# Patient Record
Sex: Male | Born: 1955 | Race: White | Hispanic: No | Smoking: Current every day smoker
Health system: Southern US, Community
[De-identification: ages and names within clinical notes are randomized; demographics above are authoritative.]

## PROBLEM LIST (undated history)

## (undated) DIAGNOSIS — I219 Acute myocardial infarction, unspecified: Secondary | ICD-10-CM

## (undated) DIAGNOSIS — I4891 Unspecified atrial fibrillation: Secondary | ICD-10-CM

## (undated) DIAGNOSIS — N289 Disorder of kidney and ureter, unspecified: Secondary | ICD-10-CM

## (undated) DIAGNOSIS — I10 Essential (primary) hypertension: Secondary | ICD-10-CM

## (undated) DIAGNOSIS — J449 Chronic obstructive pulmonary disease, unspecified: Secondary | ICD-10-CM

## (undated) HISTORY — PX: KIDNEY TRANSPLANT: SHX239

## (undated) HISTORY — PX: NEPHRECTOMY TRANSPLANTED ORGAN: SUR880

---

## 2007-12-12 ENCOUNTER — Ambulatory Visit (HOSPITAL_COMMUNITY): Admission: RE | Admit: 2007-12-12 | Discharge: 2007-12-12 | Payer: Self-pay | Admitting: Nephrology

## 2009-04-25 HISTORY — PX: KIDNEY TRANSPLANT: SHX239

## 2010-01-26 ENCOUNTER — Emergency Department (HOSPITAL_COMMUNITY): Admission: EM | Admit: 2010-01-26 | Discharge: 2010-01-26 | Payer: Self-pay | Admitting: Emergency Medicine

## 2014-03-08 ENCOUNTER — Emergency Department (HOSPITAL_COMMUNITY): Payer: Medicare Other

## 2014-03-08 ENCOUNTER — Emergency Department (HOSPITAL_COMMUNITY)
Admission: EM | Admit: 2014-03-08 | Discharge: 2014-03-08 | Disposition: A | Discharge status: Home / Self Care | Payer: Medicare Other | Attending: Emergency Medicine | Admitting: Emergency Medicine

## 2014-03-08 ENCOUNTER — Encounter (HOSPITAL_COMMUNITY): Payer: Self-pay | Admitting: Cardiology

## 2014-03-08 DIAGNOSIS — S20212A Contusion of left front wall of thorax, initial encounter: Secondary | ICD-10-CM | POA: Insufficient documentation

## 2014-03-08 DIAGNOSIS — S7002XA Contusion of left hip, initial encounter: Secondary | ICD-10-CM | POA: Insufficient documentation

## 2014-03-08 DIAGNOSIS — Z87448 Personal history of other diseases of urinary system: Secondary | ICD-10-CM | POA: Diagnosis not present

## 2014-03-08 DIAGNOSIS — S0083XA Contusion of other part of head, initial encounter: Secondary | ICD-10-CM | POA: Diagnosis not present

## 2014-03-08 DIAGNOSIS — W228XXA Striking against or struck by other objects, initial encounter: Secondary | ICD-10-CM | POA: Diagnosis not present

## 2014-03-08 DIAGNOSIS — Y9389 Activity, other specified: Secondary | ICD-10-CM | POA: Insufficient documentation

## 2014-03-08 DIAGNOSIS — Z940 Kidney transplant status: Secondary | ICD-10-CM | POA: Insufficient documentation

## 2014-03-08 DIAGNOSIS — Y998 Other external cause status: Secondary | ICD-10-CM | POA: Diagnosis not present

## 2014-03-08 DIAGNOSIS — W19XXXA Unspecified fall, initial encounter: Secondary | ICD-10-CM

## 2014-03-08 DIAGNOSIS — Y92481 Parking lot as the place of occurrence of the external cause: Secondary | ICD-10-CM | POA: Insufficient documentation

## 2014-03-08 DIAGNOSIS — I10 Essential (primary) hypertension: Secondary | ICD-10-CM | POA: Diagnosis not present

## 2014-03-08 DIAGNOSIS — Z720 Tobacco use: Secondary | ICD-10-CM | POA: Insufficient documentation

## 2014-03-08 DIAGNOSIS — S40012A Contusion of left shoulder, initial encounter: Secondary | ICD-10-CM | POA: Insufficient documentation

## 2014-03-08 DIAGNOSIS — J441 Chronic obstructive pulmonary disease with (acute) exacerbation: Secondary | ICD-10-CM | POA: Diagnosis not present

## 2014-03-08 DIAGNOSIS — W1789XA Other fall from one level to another, initial encounter: Secondary | ICD-10-CM | POA: Diagnosis not present

## 2014-03-08 DIAGNOSIS — S79912A Unspecified injury of left hip, initial encounter: Secondary | ICD-10-CM | POA: Diagnosis present

## 2014-03-08 DIAGNOSIS — E119 Type 2 diabetes mellitus without complications: Secondary | ICD-10-CM | POA: Insufficient documentation

## 2014-03-08 HISTORY — DX: Chronic obstructive pulmonary disease, unspecified: J44.9

## 2014-03-08 HISTORY — DX: Essential (primary) hypertension: I10

## 2014-03-08 HISTORY — DX: Disorder of kidney and ureter, unspecified: N28.9

## 2014-03-08 LAB — CBC
HEMATOCRIT: 45.5 % (ref 39.0–52.0)
HEMOGLOBIN: 15.4 g/dL (ref 13.0–17.0)
MCH: 31.8 pg (ref 26.0–34.0)
MCHC: 33.8 g/dL (ref 30.0–36.0)
MCV: 93.8 fL (ref 78.0–100.0)
Platelets: 150 10*3/uL (ref 150–400)
RBC: 4.85 MIL/uL (ref 4.22–5.81)
RDW: 13.4 % (ref 11.5–15.5)
WBC: 6.6 10*3/uL (ref 4.0–10.5)

## 2014-03-08 LAB — BASIC METABOLIC PANEL
Anion gap: 13 (ref 5–15)
BUN: 12 mg/dL (ref 6–23)
CHLORIDE: 99 meq/L (ref 96–112)
CO2: 23 meq/L (ref 19–32)
Calcium: 9.5 mg/dL (ref 8.4–10.5)
Creatinine, Ser: 1.03 mg/dL (ref 0.50–1.35)
GFR calc Af Amer: >90 mL/min (ref >90)
GFR, EST NON AFRICAN AMERICAN: 79 mL/min — AB (ref >90)
GLUCOSE: 106 mg/dL — AB (ref 70–99)
POTASSIUM: 4 meq/L (ref 3.7–5.3)
SODIUM: 135 meq/L — AB (ref 137–147)

## 2014-03-08 MED ORDER — ONDANSETRON HCL 4 MG/2ML IJ SOLN
4.0000 mg | Freq: Once | INTRAMUSCULAR | Status: AC
  Administered 2014-03-08: 4 mg via INTRAVENOUS
  Filled 2014-03-08: qty 2

## 2014-03-08 MED ORDER — METHOCARBAMOL 500 MG PO TABS: 1000.0000 mg | ORAL_TABLET | Freq: Four times a day (QID) | ORAL | Status: DC

## 2014-03-08 MED ORDER — HYDROMORPHONE HCL 1 MG/ML IJ SOLN
1.0000 mg | Freq: Once | INTRAMUSCULAR | Status: AC
  Administered 2014-03-08: 1 mg via INTRAVENOUS
  Filled 2014-03-08: qty 1

## 2014-03-08 MED ORDER — OXYCODONE-ACETAMINOPHEN 5-325 MG PO TABS: 1.0000 | ORAL_TABLET | Freq: Four times a day (QID) | ORAL | Status: DC | PRN

## 2014-03-08 NOTE — Progress Notes (Signed)
RT late entry: Instructed patient with IS use. Good effort times 4, 500-700.

## 2014-03-08 NOTE — ED Notes (Signed)
Patient transported to X-ray 

## 2014-03-08 NOTE — ED Provider Notes (Signed)
CSN: [id number]     Arrival date & time 03/08/14  1037 History   First MD Initiated Contact with Patient 03/08/14 1053     Chief Complaint  Patient presents with  . Fall  . Hip Pain     (Consider location/radiation/quality/duration/timing/severity/associated sxs/prior Treatment) HPI Comments: Patient with history of diabetes, renal transplant, hypertension -- presents after a fall which occurred 1-1/2 days ago. Patient was on the top of a construction van approximately 8 feet in the air. Patient fell onto the asphalt onto his left back. A construction ladder then fell striking him on the right side of the head. No LOC. EMS was called but patient did not want to go to the hospital. Patient states that yesterday he felt a popping pain in his back. He has been short of breath. He complains of left shoulder and left hip pain as well as pain in his left lateral back. No blurry vision, vomiting, headache. He takes I hydrocodone at home which helps take the edge off of the pain. His pain was severe today so he came to the emergency department. He denies numbness, tingling in his extremities.  Patient is a 58 y.o. male presenting with fall and hip pain. The history is provided by the patient and the spouse.  Fall Associated symptoms include arthralgias. Pertinent negatives include no abdominal pain, chest pain, coughing, fatigue, fever, headaches, myalgias, nausea, neck pain, numbness, rash, sore throat, vomiting or weakness.  Hip Pain Associated symptoms include arthralgias. Pertinent negatives include no abdominal pain, chest pain, coughing, fatigue, fever, headaches, myalgias, nausea, neck pain, numbness, rash, sore throat, vomiting or weakness.    Past Medical History  Diagnosis Date  . Hypertension   . Renal disorder   . COPD (chronic obstructive pulmonary disease)    Past Surgical History  Procedure Laterality Date  . [facility]   History reviewed. No pertinent  family history. History  Substance Use Topics  . Smoking status: Current Every Day Smoker    Types: Cigarettes  . Smokeless tobacco: Not on file  . Alcohol Use: Yes    Review of Systems  Constitutional: Negative for fever and fatigue.  HENT: Negative for rhinorrhea, sore throat and tinnitus.   Eyes: Negative for photophobia, pain, redness and visual disturbance.  Respiratory: Negative for cough and shortness of breath.   Cardiovascular: Negative for chest pain.  Gastrointestinal: Negative for nausea, vomiting, abdominal pain and diarrhea.  Genitourinary: Negative for dysuria.  Musculoskeletal: Positive for back pain and arthralgias. Negative for myalgias, gait problem and neck pain.  Skin: Positive for color change. Negative for rash and wound.  Neurological: Negative for dizziness, weakness, light-headedness, numbness and headaches.  Psychiatric/Behavioral: Negative for confusion and decreased concentration.    Allergies  Review of patient's allergies indicates no known allergies.  Home Medications   Prior to Admission medications   Not on File   BP 168/110 mmHg  Pulse 73  Temp(Src) 97.9 F (36.6 C) (Oral)  Resp 18  Ht 5\' 8"  (1.727 m)  Wt 120 lb (54.432 kg)  BMI 18.25 kg/m2  SpO2 94%   Physical Exam  Constitutional: He is oriented to person, place, and time. He appears well-developed and well-nourished.  HENT:  Head: Normocephalic. Head is without raccoon's eyes and without Battle's sign.  Right Ear: Tympanic membrane, external ear and ear canal normal. No hemotympanum.  Left Ear: Tympanic membrane, external ear and ear canal normal. No hemotympanum.  Nose: Nose normal. No  nasal septal hematoma.  Mouth/Throat: Oropharynx is clear and moist.  Mild ecchymosis R lateral forehead  Eyes: Conjunctivae, EOM and lids are normal. Right eye exhibits no discharge. Left eye exhibits no discharge. Right pupil is not reactive (chronic). Right pupil is round. Left pupil is round  and reactive. Pupils are unequal (chronic).  No visible hyphema. 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   Neck: Normal range of motion. Neck supple.  Cardiovascular: Normal rate, regular rhythm and normal heart sounds.   No murmur heard. Pulmonary/Chest: Effort normal and breath sounds normal. He exhibits no tenderness.  Abdominal: Soft. There is no tenderness.  Musculoskeletal: Normal range of motion.       Right shoulder: He exhibits normal range of motion, no tenderness and no bony tenderness.       Left shoulder: He exhibits tenderness and bony tenderness. He exhibits normal range of motion.       Right elbow: Normal.      Left elbow: Normal.       Right wrist: Normal.       Left wrist: Normal.       Right hip: Normal.       Left hip: He exhibits tenderness and bony tenderness. He exhibits normal range of motion and normal strength.       Right knee: Normal.       Left knee: Normal.       Right ankle: Normal.       Left ankle: Normal.       Cervical back: He exhibits normal range of motion, no tenderness and no bony tenderness.       Thoracic back: He exhibits no tenderness and no bony tenderness.       Lumbar back: He exhibits no tenderness and no bony tenderness.       Back:       Right hand: Normal.       Left hand: Normal.  Neurological: He is alert and oriented to person, place, and time. He has normal strength and normal reflexes. No cranial nerve deficit or sensory deficit. Coordination normal. GCS eye subscore is 4. GCS verbal subscore is 5. GCS motor subscore is 6.  Skin: Skin is warm and dry.  Psychiatric: He has a normal mood and affect.  Nursing note and vitals reviewed.   ED Course  Procedures (including critical care time) Labs Review Labs Reviewed  BASIC METABOLIC PANEL - Abnormal; Notable for the following:    Sodium 135 (*)    Glucose, Bld 106 (*)    GFR calc non Af Amer 79 (*)    All other components within normal limits  CBC    URINALYSIS, ROUTINE W REFLEX MICROSCOPIC    Imaging Review Dg Ribs Unilateral W/chest Left  03/08/2014   CLINICAL DATA:  Fall 2 days previous with persistent left posterior chest pain  EXAM: LEFT RIBS AND CHEST - 3+ VIEW  COMPARISON:  None.  FINDINGS: No fracture or other bone lesions are seen involving the ribs. There is no evidence of pneumothorax or pleural effusion. Both lungs are clear. Heart size and mediastinal contours are within normal limits. Scattered calcified granulomas are noted.  IMPRESSION: No acute abnormality noted.   Electronically Signed   By: Alcide Clever M.D.   On: 03/08/2014 13:27   Dg Hip Complete Left  03/08/2014   CLINICAL DATA:  Larey Seat 2 days previous with persistent pain, initial encounter  EXAM: LEFT HIP - COMPLETE 2+ VIEW  COMPARISON:  None  FINDINGS: The pelvic ring is intact. No fracture or dislocation is seen. No gross soft tissue abnormality is noted.  IMPRESSION: No acute abnormality noted.   Electronically Signed   By: Alcide CleverMark  Lukens M.D.   On: 03/08/2014 13:26   Dg Shoulder Left  03/08/2014   CLINICAL DATA:  Larey SeatFell 2 days previous with persistent pain, initial encounter  EXAM: LEFT SHOULDER - 2+ VIEW  COMPARISON:  None.  FINDINGS: The humeral head is well seated. There is irregularity of the inferior aspect of the glenoid which may represent an acute fracture. This is only seen on the frontal film and not well appreciated on the Y-view. No other focal abnormality is seen.  IMPRESSION: Changes suggestive of a scapular fracture just beneath the glenoid. CT may be helpful for further evaluation as indicated.   Electronically Signed   By: Alcide CleverMark  Lukens M.D.   On: 03/08/2014 13:24     EKG Interpretation None       11:08 AM Patient seen and examined. Work-up initiated. Medications ordered. D/w Dr. Hyacinth MeekerMiller.   Vital signs reviewed and are as follows: BP 168/110 mmHg  Pulse 73  Temp(Src) 97.9 F (36.6 C) (Oral)  Resp 18  Ht 5\' 8"  (1.727 m)  Wt 120 lb (54.432 kg)   BMI 18.25 kg/m2  SpO2 94%   2:09 PM Patient's pain is much improved. He is breathing normally. Patient and wife informed of results. We talked about shoulder x-ray finding. We discussed that radiologist recommends CT to tell if there is a fracture there. We discussed that treatment will not likely be different if there is a fracture, however recovery and restrictions would be different. Patient and wife do not want to proceed with CT at this time. They would like to treat symptoms and follow-up for consideration of CT if not improving.   Discussed that rib fracture vs contusion is treated similarly with pain medication and breathing exercise every hour.   Patient verbalizes understanding and agrees with plan.   Patient counseled on use of narcotic pain medications. Counseled not to combine these medications with others containing tylenol. Urged not to drink alcohol, drive, or perform any other activities that requires focus while taking these medications. The patient verbalizes understanding and agrees with the plan.  MDM   Final diagnoses:  Fall  Rib contusion, left, initial encounter  Shoulder contusion, left, initial encounter  Contusion, hip, left, initial encounter   Patient with fall. Imaging per above. No concern for closed head injury -- asymptomatic now 36 hrs out. No abd tenderness. Suspect rib contusion/fracture. Underlying lung tissue is normal, no contusion. Good ROM hip/shoulder. Despite imaging, I have low suspicion for scapular fracture. Most of patient's pain is in posterior ribs.   No dangerous or life-threatening conditions suspected or identified by history, physical exam, and by work-up. No indications for hospitalization identified.       Renne CriglerJoshua Daven Montz, PA-C 03/08/14 1419  Vida RollerBrian D Miller, MD 03/09/14 [phone]

## 2014-03-08 NOTE — Discharge Instructions (Signed)
Please read and follow all provided instructions.  Your diagnoses today include:  1. Rib contusion, left, initial encounter   2. Fall   3. Shoulder contusion, left, initial encounter   4. Contusion, hip, left, initial encounter    Tests performed today include:  X-ray of hip and chest - no broken bones  X-ray of shoulder - shows questionable broken bone in scapula  Kidney function and blood counts - normal  Vital signs. See below for your results today.   Medications prescribed:   Percocet (oxycodone/acetaminophen) - narcotic pain medication  DO NOT drive or perform any activities that require you to be awake and alert because this medicine can make you drowsy. BE VERY CAREFUL not to take multiple medicines containing Tylenol (also called acetaminophen). Doing so can lead to an overdose which can damage your liver and cause liver failure and possibly death.   Robaxin (methocarbamol) - muscle relaxer medication  DO NOT drive or perform any activities that require you to be awake and alert because this medicine can make you drowsy.   Take any prescribed medications only as directed.  Home care instructions:  Follow any educational materials contained in this packet.  Take 10 deep breaths every hour while awake with incentive spirometer. This helps to expand your lungs and prevent infections like pneumonia.   Follow-up instructions: Please follow-up with your primary care provider in the next 3 days for further evaluation of your symptoms. Follow-up with the listed orthopedic physician as desired.   Return instructions:   Please return to the Emergency Department if you experience worsening symptoms.   Return with worsening shortness of breath or trouble breathing.   Please return if you have any other emergent concerns.  Additional Information:  Your vital signs today were: BP 160/98 mmHg   Pulse 61   Temp(Src) 97.9 F (36.6 C) (Oral)   Resp 7   Ht 5\' 8"  (1.727 m)    Wt 120 lb (54.432 kg)   BMI 18.25 kg/m2   SpO2 94% If your blood pressure (BP) was elevated above 135/85 this visit, please have this repeated by your doctor within one month. --------------

## 2014-03-08 NOTE — ED Provider Notes (Signed)
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 This occurred on Thursday. On exam the patient has tenderness with range of motion of the left arm at the shoulder, tenderness with palpation over the left lateral chest wall and posterior mid-thorax without any crepitance or subcutaneous emphysema. No obvious bruising of those areas. Left lower extremity has good range of motion at the hip and knee and ankle, these joints are supple, there is no leg length discrepancies or shortening or rotation. He is able to straight leg raise against resistance. He does have some bruising over the right temporal area where he states that a ladder fell and struck him in the head. The patient will be pain control, imaging of his injured areas including left shoulder ribs and left hip, pain control, check renal function, otherwise the patient appears stable, there is no abdominal tenderness to suggest splenic or other intra-abdominal injury.  Medical screening examination/treatment/procedure(s) were conducted as a shared visit with non-physician practitioner(s) and myself.  I personally evaluated the patient during the encounter.  Clinical Impression:   Final diagnoses:  Fall  Rib contusion, left, initial encounter  Shoulder contusion, left, initial encounter  Contusion, hip, left, initial encounter         Vida RollerBrian D Blakeleigh Domek, MD 03/09/14 [phone]

## 2014-03-08 NOTE — ED Notes (Signed)
Pt reports he fell off the top of a van and then had a construction ladder fall into him. Reports pain down his entire left side. Pt is in severe pain and increased pain with breathing. Pt denies any LOC.

## 2016-12-03 ENCOUNTER — Emergency Department (HOSPITAL_COMMUNITY)
Admission: EM | Admit: 2016-12-03 | Discharge: 2016-12-03 | Disposition: A | Discharge status: Home Health | Payer: Medicare Other | Attending: Emergency Medicine | Admitting: Emergency Medicine

## 2016-12-03 ENCOUNTER — Encounter (HOSPITAL_COMMUNITY): Payer: Self-pay | Admitting: Emergency Medicine

## 2016-12-03 ENCOUNTER — Emergency Department (HOSPITAL_COMMUNITY): Payer: Medicare Other

## 2016-12-03 DIAGNOSIS — Z79899 Other long term (current) drug therapy: Secondary | ICD-10-CM | POA: Insufficient documentation

## 2016-12-03 DIAGNOSIS — F17210 Nicotine dependence, cigarettes, uncomplicated: Secondary | ICD-10-CM | POA: Insufficient documentation

## 2016-12-03 DIAGNOSIS — J449 Chronic obstructive pulmonary disease, unspecified: Secondary | ICD-10-CM | POA: Diagnosis not present

## 2016-12-03 DIAGNOSIS — R1084 Generalized abdominal pain: Secondary | ICD-10-CM | POA: Insufficient documentation

## 2016-12-03 DIAGNOSIS — R109 Unspecified abdominal pain: Secondary | ICD-10-CM

## 2016-12-03 DIAGNOSIS — R112 Nausea with vomiting, unspecified: Secondary | ICD-10-CM | POA: Diagnosis not present

## 2016-12-03 DIAGNOSIS — I10 Essential (primary) hypertension: Secondary | ICD-10-CM | POA: Diagnosis not present

## 2016-12-03 LAB — COMPREHENSIVE METABOLIC PANEL
ALK PHOS: 76 U/L (ref 38–126)
ALT: 17 U/L (ref 17–63)
ANION GAP: 9 (ref 5–15)
AST: 38 U/L (ref 15–41)
Albumin: 3.7 g/dL (ref 3.5–5.0)
BILIRUBIN TOTAL: 0.9 mg/dL (ref 0.3–1.2)
BUN: 13 mg/dL (ref 6–20)
CO2: 26 mmol/L (ref 22–32)
Calcium: 9.5 mg/dL (ref 8.9–10.3)
Chloride: 100 mmol/L — ABNORMAL LOW (ref 101–111)
Creatinine, Ser: 1 mg/dL (ref 0.61–1.24)
GFR calc non Af Amer: >60 mL/min (ref >60)
Glucose, Bld: 126 mg/dL — ABNORMAL HIGH (ref 65–99)
Potassium: 4.1 mmol/L (ref 3.5–5.1)
SODIUM: 135 mmol/L (ref 135–145)
TOTAL PROTEIN: 7.8 g/dL (ref 6.5–8.1)

## 2016-12-03 LAB — CBC WITH DIFFERENTIAL/PLATELET
BASOS ABS: 0 10*3/uL (ref 0.0–0.1)
Basophils Relative: 0 %
EOS ABS: 0 10*3/uL (ref 0.0–0.7)
Eosinophils Relative: 0 %
HCT: 48.8 % (ref 39.0–52.0)
HEMOGLOBIN: 17 g/dL (ref 13.0–17.0)
LYMPHS ABS: 0.8 10*3/uL (ref 0.7–4.0)
LYMPHS PCT: 12 %
MCH: 32.3 pg (ref 26.0–34.0)
MCHC: 34.8 g/dL (ref 30.0–36.0)
MCV: 92.8 fL (ref 78.0–100.0)
Monocytes Absolute: 0.7 10*3/uL (ref 0.1–1.0)
Monocytes Relative: 11 %
NEUTROS PCT: 77 %
Neutro Abs: 5.2 10*3/uL (ref 1.7–7.7)
Platelets: 178 10*3/uL (ref 150–400)
RBC: 5.26 MIL/uL (ref 4.22–5.81)
RDW: 13.7 % (ref 11.5–15.5)
WBC: 6.7 10*3/uL (ref 4.0–10.5)

## 2016-12-03 LAB — URINALYSIS, ROUTINE W REFLEX MICROSCOPIC
Bilirubin Urine: NEGATIVE
GLUCOSE, UA: NEGATIVE mg/dL
Ketones, ur: 5 mg/dL — AB
LEUKOCYTES UA: NEGATIVE
NITRITE: NEGATIVE
Protein, ur: >=300 mg/dL — AB
SPECIFIC GRAVITY, URINE: 1.023 (ref 1.005–1.030)
Squamous Epithelial / LPF: NONE SEEN
pH: 5 (ref 5.0–8.0)

## 2016-12-03 LAB — LIPASE, BLOOD: LIPASE: 24 U/L (ref 11–51)

## 2016-12-03 MED ORDER — SODIUM CHLORIDE 0.9 % IV SOLN
INTRAVENOUS | Status: DC
  Administered 2016-12-03: 125 mL/h via INTRAVENOUS

## 2016-12-03 NOTE — ED Triage Notes (Signed)
Pt reports abd pain with nausea and vomiting since last night.  No BM for the past several days which is unusual for pt.

## 2016-12-03 NOTE — Discharge Instructions (Signed)
Follow up with your transplant team next week.  If you were given medicines take as directed.  If you are on coumadin or contraceptives realize their levels and effectiveness is altered by many different medicines.  If you have any reaction (rash, tongues swelling, other) to the medicines stop taking and see a physician.    If your blood pressure was elevated in the ER make sure you follow up for management with a primary doctor or return for chest pain, shortness of breath or stroke symptoms.  Please follow up as directed and return to the ER or see a physician for new or worsening symptoms.  Thank you. Vitals:   12/03/16 0944 12/03/16 0946 12/03/16 1130  BP:  (!) 180/72 126/86  Pulse:  82 62  Resp:  16 16  Temp:  98.8 F (37.1 C)   TempSrc:  Oral   SpO2:  95% 96%  Weight: 50.3 kg (111 lb)    Height: 5\' 8"  (1.727 m)

## 2016-12-03 NOTE — ED Provider Notes (Signed)
[facility]-EMERGENCY DEPT Provider Note   CSN: [id number] Arrival date & time: 12/03/16  [id number]     History   Chief Complaint Chief Complaint  Patient presents with  . Abdominal Pain  . Emesis    HPI Calvin Carroll is a 61 y.o. male.  Patient with history of 2 kidney transplants currently on immunosuppressants [facility] and has been doing well, history of high blood pressure and COPD presents with central abdominal pain and vomiting start last night. Pain has improved vomiting has stopped. Patient is not abdominal but for 2 days and decreased passing of gas. No history of bowel obstruction. No fevers or chills. Patient is been compliant with medications. No urinary symptoms.      Past Medical History:  Diagnosis Date  . COPD (chronic obstructive pulmonary disease) (HCC)   . Hypertension   . Renal disorder     There are no active problems to display for this patient.   Past Surgical History:  Procedure Laterality Date  . KIDNEY TRANSPLANT     [facility]  . KIDNEY TRANSPLANT  2011   Pt has had 2 transplants  . NEPHRECTOMY TRANSPLANTED ORGAN         Home Medications    Prior to Admission medications   Medication Sig Start Date End Date Taking? Authorizing Provider  atorvastatin (LIPITOR) 10 MG tablet Take 5 mg by mouth daily.    [provider]  fexofenadine (ALLEGRA) 180 MG tablet Take 180 mg by mouth daily.    [provider]  LABETALOL HCL PO Take 2 tablets by mouth 2 (two) times daily.    [provider]  lisinopril (PRINIVIL,ZESTRIL) 20 MG tablet Take 20 mg by mouth daily.    [provider]  methocarbamol (ROBAXIN) 500 MG tablet Take 2 tablets (1,000 mg total) by mouth 4 (four) times daily. 03/08/14   Renne CriglerGeiple, Viviane Semidey, PA-C  mycophenolate (MYFORTIC) 360 MG TBEC EC tablet Take 720 mg by mouth 2 (two) times daily.    [provider]  omeprazole (PRILOSEC) 20 MG capsule Take 20 mg by mouth  daily.    [provider]  oxyCODONE-acetaminophen (PERCOCET/ROXICET) 5-325 MG per tablet Take 1-2 tablets by mouth every 6 (six) hours as needed for severe pain. 03/08/14   Renne CriglerGeiple, Audria Takeshita, PA-C  predniSONE (DELTASONE) 5 MG tablet Take 5 mg by mouth daily with breakfast.    [provider]  tacrolimus (PROGRAF) 0.5 MG capsule Take 1 mg by mouth 2 (two) times daily.    [provider]    Family History History reviewed. No pertinent family history.  Social History Social History  Substance Use Topics  . Smoking status: Current Every Day Smoker    Types: Cigarettes  . Smokeless tobacco: Not on file  . Alcohol use Yes     Comment: weekly     Allergies   Patient has no known allergies.   Review of Systems Review of Systems  Constitutional: Negative for chills and fever.  HENT: Negative for congestion.   Eyes: Negative for visual disturbance.  Respiratory: Negative for shortness of breath.   Cardiovascular: Negative for chest pain.  Gastrointestinal: Positive for abdominal pain, nausea and vomiting.  Genitourinary: Negative for dysuria and flank pain.  Musculoskeletal: Negative for back pain, neck pain and neck stiffness.  Skin: Negative for rash.  Neurological: Negative for light-headedness and headaches.     Physical Exam Updated Vital Signs BP (!) 180/72 (BP Location: Right Arm)  Pulse 82   Temp 98.8 F (37.1 C) (Oral)   Resp 16   Ht 5\' 8"  (1.727 m)   Wt 50.3 kg (111 lb)   SpO2 95%   BMI 16.88 kg/m   Physical Exam  Constitutional: He is oriented to person, place, and time. He appears well-developed and well-nourished.  HENT:  Head: Normocephalic and atraumatic.  Eyes: Conjunctivae are normal. Right eye exhibits no discharge. Left eye exhibits no discharge.  Neck: Normal range of motion. Neck supple. No tracheal deviation present.  Cardiovascular: Normal rate and regular rhythm.   Pulmonary/Chest: Effort normal and breath sounds  normal.  Abdominal: Soft. He exhibits no distension. There is no tenderness. There is no guarding.  Musculoskeletal: He exhibits no edema.  Neurological: He is alert and oriented to person, place, and time.  Skin: Skin is warm. No rash noted.  Psychiatric: He has a normal mood and affect.  Nursing note and vitals reviewed.    ED Treatments / Results  Labs (all labs ordered are listed, but only abnormal results are displayed) Labs Reviewed  CBC WITH DIFFERENTIAL/PLATELET  LIPASE, BLOOD  COMPREHENSIVE METABOLIC PANEL  URINALYSIS, ROUTINE W REFLEX MICROSCOPIC    EKG  EKG Interpretation None       Radiology No results found.  Procedures Procedures (including critical care time)  Medications Ordered in ED Medications  0.9 %  sodium chloride infusion (not administered)     Initial Impression / Assessment and Plan / ED Course  I have reviewed the triage vital signs and the nursing notes.  Pertinent labs & imaging results that were available during my care of the patient were reviewed by me and considered in my medical decision making (see chart for details).    Renal transplant patient presents with new abdominal pain or vomiting for which most has resolved. Plan for CT scan to look for signs of obstruction check blood work and kidney function.  No sxs on recheck.  CT unremarkable.  Outpt fup.  Results and differential diagnosis were discussed with the patient/parent/guardian. Xrays were independently reviewed by myself.  Close follow up outpatient was discussed, comfortable with the plan.   Medications  0.9 %  sodium chloride infusion (125 mL/hr Intravenous New Bag/Given 12/03/16 1036)    Vitals:   12/03/16 0944 12/03/16 0946 12/03/16 1130 12/03/16 1234  BP:  (!) 180/72 126/86 140/85  Pulse:  82 62 100  Resp:  16 16 16   Temp:  98.8 F (37.1 C)    TempSrc:  Oral    SpO2:  95% 96% 96%  Weight: 50.3 kg (111 lb)     Height: 5\' 8"  (1.727 m)       Final  diagnoses:  Abdominal pain, unspecified abdominal location  Non-intractable vomiting with nausea, unspecified vomiting type     Final Clinical Impressions(s) / ED Diagnoses   Final diagnoses:  Abdominal pain, unspecified abdominal location  Non-intractable vomiting with nausea, unspecified vomiting type    New Prescriptions New Prescriptions   No medications on file     Blane Ohara, MD 12/03/16 1237

## 2017-03-11 ENCOUNTER — Emergency Department (HOSPITAL_COMMUNITY): Payer: Medicare Other

## 2017-03-11 ENCOUNTER — Encounter (HOSPITAL_COMMUNITY): Payer: Self-pay | Admitting: Emergency Medicine

## 2017-03-11 ENCOUNTER — Emergency Department (HOSPITAL_COMMUNITY)
Admission: EM | Admit: 2017-03-11 | Discharge: 2017-03-11 | Disposition: A | Discharge status: Home / Self Care | Payer: Medicare Other | Attending: Emergency Medicine | Admitting: Emergency Medicine

## 2017-03-11 DIAGNOSIS — F17210 Nicotine dependence, cigarettes, uncomplicated: Secondary | ICD-10-CM | POA: Diagnosis not present

## 2017-03-11 DIAGNOSIS — I10 Essential (primary) hypertension: Secondary | ICD-10-CM | POA: Insufficient documentation

## 2017-03-11 DIAGNOSIS — R1013 Epigastric pain: Secondary | ICD-10-CM | POA: Diagnosis not present

## 2017-03-11 DIAGNOSIS — J449 Chronic obstructive pulmonary disease, unspecified: Secondary | ICD-10-CM | POA: Diagnosis not present

## 2017-03-11 DIAGNOSIS — Z940 Kidney transplant status: Secondary | ICD-10-CM | POA: Insufficient documentation

## 2017-03-11 DIAGNOSIS — R079 Chest pain, unspecified: Secondary | ICD-10-CM | POA: Diagnosis present

## 2017-03-11 DIAGNOSIS — Z7982 Long term (current) use of aspirin: Secondary | ICD-10-CM | POA: Insufficient documentation

## 2017-03-11 DIAGNOSIS — Z79899 Other long term (current) drug therapy: Secondary | ICD-10-CM | POA: Diagnosis not present

## 2017-03-11 LAB — CBC
HEMATOCRIT: 47.5 % (ref 39.0–52.0)
HEMOGLOBIN: 16.2 g/dL (ref 13.0–17.0)
MCH: 31 pg (ref 26.0–34.0)
MCHC: 34.1 g/dL (ref 30.0–36.0)
MCV: 90.8 fL (ref 78.0–100.0)
Platelets: 188 10*3/uL (ref 150–400)
RBC: 5.23 MIL/uL (ref 4.22–5.81)
RDW: 13.2 % (ref 11.5–15.5)
WBC: 6.7 10*3/uL (ref 4.0–10.5)

## 2017-03-11 LAB — BASIC METABOLIC PANEL
Anion gap: 9 (ref 5–15)
BUN: 11 mg/dL (ref 6–20)
CO2: 27 mmol/L (ref 22–32)
Calcium: 9.6 mg/dL (ref 8.9–10.3)
Chloride: 95 mmol/L — ABNORMAL LOW (ref 101–111)
Creatinine, Ser: 0.99 mg/dL (ref 0.61–1.24)
GFR calc Af Amer: >60 mL/min (ref >60)
GFR calc non Af Amer: >60 mL/min (ref >60)
Glucose, Bld: 84 mg/dL (ref 65–99)
Potassium: 3.7 mmol/L (ref 3.5–5.1)
Sodium: 131 mmol/L — ABNORMAL LOW (ref 135–145)

## 2017-03-11 LAB — LIPASE, BLOOD: Lipase: 27 U/L (ref 11–51)

## 2017-03-11 LAB — TROPONIN I: Troponin I: <0.03 ng/mL (ref <0.03)

## 2017-03-11 MED ORDER — LABETALOL HCL 200 MG PO TABS
100.0000 mg | ORAL_TABLET | Freq: Once | ORAL | Status: AC
  Administered 2017-03-11: 100 mg via ORAL
  Filled 2017-03-11: qty 1

## 2017-03-11 MED ORDER — HYDROGEN PEROXIDE 3 % EX SOLN
CUTANEOUS | Status: AC
  Filled 2017-03-11: qty 473

## 2017-03-11 MED ORDER — PANTOPRAZOLE SODIUM 40 MG IV SOLR
40.0000 mg | Freq: Once | INTRAVENOUS | Status: AC
  Administered 2017-03-11: 40 mg via INTRAVENOUS
  Filled 2017-03-11: qty 40

## 2017-03-11 MED ORDER — ONDANSETRON HCL 4 MG/2ML IJ SOLN
4.0000 mg | Freq: Once | INTRAMUSCULAR | Status: AC
  Administered 2017-03-11: 4 mg via INTRAVENOUS
  Filled 2017-03-11: qty 2

## 2017-03-11 MED ORDER — HYDROMORPHONE HCL 1 MG/ML IJ SOLN
1.0000 mg | Freq: Once | INTRAMUSCULAR | Status: AC
  Administered 2017-03-11: 1 mg via INTRAVENOUS
  Filled 2017-03-11: qty 1

## 2017-03-11 MED ORDER — SODIUM CHLORIDE 0.9 % IV BOLUS (SEPSIS)
1000.0000 mL | Freq: Once | INTRAVENOUS | Status: AC
  Administered 2017-03-11: 1000 mL via INTRAVENOUS

## 2017-03-11 MED ORDER — HYDROMORPHONE HCL 1 MG/ML IJ SOLN
0.5000 mg | Freq: Once | INTRAMUSCULAR | Status: AC
  Administered 2017-03-11: 0.5 mg via INTRAVENOUS
  Filled 2017-03-11: qty 1

## 2017-03-11 MED ORDER — SODIUM CHLORIDE 0.9 % IV SOLN
INTRAVENOUS | Status: DC
  Administered 2017-03-11: 17:00:00 via INTRAVENOUS

## 2017-03-11 MED ORDER — SUCRALFATE 1 G PO TABS: 1.0000 g | ORAL_TABLET | Freq: Three times a day (TID) | ORAL | 0 refills | Status: DC

## 2017-03-11 MED ORDER — GI COCKTAIL ~~LOC~~
30.0000 mL | Freq: Once | ORAL | Status: AC
  Administered 2017-03-11: 30 mL via ORAL
  Filled 2017-03-11: qty 30

## 2017-03-11 MED ORDER — IOPAMIDOL (ISOVUE-370) INJECTION 76%
80.0000 mL | Freq: Once | INTRAVENOUS | Status: AC | PRN
  Administered 2017-03-11: 80 mL via INTRAVENOUS

## 2017-03-11 MED ORDER — FAMOTIDINE IN NACL 20-0.9 MG/50ML-% IV SOLN
20.0000 mg | INTRAVENOUS | Status: AC
  Administered 2017-03-11: 20 mg via INTRAVENOUS
  Filled 2017-03-11: qty 50

## 2017-03-11 MED ORDER — RANITIDINE HCL 150 MG PO TABS: 150.0000 mg | ORAL_TABLET | Freq: Two times a day (BID) | ORAL | 0 refills | Status: DC

## 2017-03-11 NOTE — Discharge Instructions (Signed)
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  Ranitidine twice daily for 2 weeks Continue your pantoprazole Carafate 3 times a day for 3 days No acid containing foods, no pain medicine (other than tylenol or hydrocodone only as needed) ER for worsening pain

## 2017-03-11 NOTE — ED Provider Notes (Signed)
[facility]ENCY [facility]PARTMENT Provider Note   CSN: [id number] Arrival date & time: 03/11/17  1404     History   Chief Complaint Chief Complaint  Patient presents with  . Chest Pain    HPI Calvin Carroll is a 61 y.o. male.  HPI  The patient is a 61 year old male, he presents with back pain that has been going on for the better part of the last week - was seen at the [facility] and started on hydrocodone and Flexeril, he has now developed pain that is in the mid epigastrium, is radiating to the back at the same level and is severe - it does not change with position or eating - he has had little to eat today - no vomiting, no diarrhea - infact since starting the hydrocodone has not had a BM in 4 days.  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  He has had endoscopy which showed that he had "blisters" in his stomach and was started on protonix wtihout any sig relief.  He does take alcohol occasionally - 2 beers after work - denies heavy ETOH use - and has never had pacnreatitis.  He has had multiple kidney transplants, the most recent of which was 2011 - he has good kidney function as far as he knows.    Past Medical History:  Diagnosis Date  . COPD (chronic obstructive pulmonary disease) (HCC)   . Hypertension   . Renal disorder     There are no active problems to display for this patient.   Past Surgical History:  Procedure Laterality Date  . KIDNEY TRANSPLANT     [facility]  . KIDNEY TRANSPLANT  2011   Pt has had 2 transplants  . NEPHRECTOMY TRANSPLANTED ORGAN         Home Medications    Prior to Admission medications   Medication Sig Start Date End Date Taking? Authorizing Provider  albuterol (PROVENTIL HFA;VENTOLIN HFA) 108 (90 Base) MCG/ACT inhaler Inhale 1-2 puffs into the lungs every 6  (six) hours as needed for wheezing or shortness of breath.   Yes [provider]  aspirin 325 MG tablet Take 325 mg by mouth daily.   Yes [provider]  cyclobenzaprine (FLEXERIL) 10 MG tablet Take 10 mg at bedtime by mouth.   Yes [provider]  fexofenadine (ALLEGRA) 180 MG tablet Take 180 mg by mouth daily.   Yes [provider]  fluticasone (FLONASE) 50 MCG/ACT nasal spray Place 1 spray into both nostrils daily.   Yes [provider]  HYDROcodone-acetaminophen (NORCO) 10-325 MG tablet Take 1 tablet by mouth 2 (two) times daily as needed for moderate pain.   Yes [provider]  labetalol (NORMODYNE) 200 MG tablet Take 200 mg by mouth 2 (two) times daily.   Yes [provider]  lisinopril (PRINIVIL,ZESTRIL) 20 MG tablet Take 20 mg by mouth daily.   Yes [provider]  mycophenolate (MYFORTIC) 360 MG TBEC EC tablet Take 360 mg by mouth 2 (two) times daily.    Yes [provider]  pantoprazole (PROTONIX) 40 MG tablet Take 40 mg by mouth daily.   Yes [provider]  predniSONE (DELTASONE) 5 MG tablet Take 5 mg by mouth daily with breakfast.   Yes [provider]  tacrolimus (PROGRAF) 1 MG capsule Take 2 mg by mouth 2 (two) times daily.   Yes [provider]  tiotropium (SPIRIVA) 18 MCG inhalation capsule Place 18 mcg into inhaler and inhale daily.   Yes [provider]  vitamin B-12 (CYANOCOBALAMIN) 1000 MCG tablet Take 1,000 mcg by mouth daily.   Yes [provider]  atorvastatin (LIPITOR) 10 MG tablet Take 10 mg by mouth at bedtime.     [provider]  Cholecalciferol (VITAMIN D-3) 1000 units CAPS Take 1 capsule by mouth daily.    [provider]  ranitidine (ZANTAC) 150 MG tablet Take 1 tablet (150 mg total) 2 (two) times daily by mouth. 03/11/17   Eber Hong, MD  sucralfate (CARAFATE) 1 g tablet Take 1 tablet (1 g total) 4 (four) times daily -   with meals and at bedtime by mouth. 03/11/17   Eber Hong, MD    Family History History reviewed. No pertinent family history.  Social History Social History   Tobacco Use  . Smoking status: Current Every Day Smoker    Packs/day: 1.00    Types: Cigarettes  . Smokeless tobacco: Never Used  Substance Use Topics  . Alcohol use: Yes    Comment: weekly  . Drug use: No     Allergies   Patient has no known allergies.   Review of Systems Review of Systems  All other systems reviewed and are negative.    Physical Exam Updated Vital Signs BP (!) 195/116   Pulse 82   Temp 98.4 F (36.9 C) (Oral)   Resp 19   Ht 5\' 8"  (1.727 m)   Wt 53.5 kg (118 lb)   SpO2 95%   BMI 17.94 kg/m   Physical Exam  Constitutional: He appears well-developed and well-nourished. He appears distressed.  HENT:  Head: Normocephalic and atraumatic.  Mouth/Throat: No oropharyngeal exudate.  MM dry  Eyes: Conjunctivae and EOM are normal. Right eye exhibits no discharge. Left eye exhibits no discharge. No scleral icterus.  R pupil is chronically dialted, L eye has normal pupil  Neck: Normal range of motion. Neck supple. No JVD present. No thyromegaly present.  Cardiovascular: Normal rate, regular rhythm, normal heart sounds and intact distal pulses. Exam reveals no gallop and no friction rub.  No murmur heard. Hypertensive, no obvious JVD  Pulmonary/Chest: Effort normal. No respiratory distress. He has wheezes. He has rales.  Rales at the right base, expiratory wheezing is mild, speaks in full sentences, no increased work of breathing  Abdominal: Soft. Bowel sounds are normal. He exhibits no mass. There is tenderness. There is guarding.  There is guarding and tenderness throughout the upper abdomen but especially in the epigastrium and periumbilical area.  There is no lower abdominal tenderness.  The abdomen is mildly distended although the patient is very skinny, there is tympanitic sounds to  percussion across the mid upper abdomen.  Musculoskeletal: Normal range of motion. He exhibits no edema or tenderness.  Lymphadenopathy:    He has no cervical adenopathy.  Neurological: He is alert. Coordination normal.  Skin: Skin is warm and dry. No rash noted. No erythema.  Psychiatric: He has a normal mood and affect. His behavior is normal.  Nursing note and vitals reviewed.    ED Treatments / Results  Labs (all labs ordered are listed, but only abnormal results are displayed) Labs Reviewed  BASIC METABOLIC PANEL - Abnormal; Notable for the following components:      Result Value  Sodium 131 (*)    Chloride 95 (*)    All other components within normal limits  CBC  TROPONIN I  LIPASE, BLOOD    EKG  EKG Interpretation  Date/Time:  Saturday March 11 2017 14:15:21 EST Ventricular Rate:  78 PR Interval:    QRS Duration: 98 QT Interval:  413 QTC Calculation: 471 R Axis:   73 Text Interpretation:  Sinus rhythm Probable left atrial enlargement Abnormal T, consider ischemia, lateral leads Confirmed by Bethann BerkshireZammit, Joseph [phone]([id number]) on 03/11/2017 2:19:49 PM       Radiology Dg Chest 2 View  Result Date: 03/11/2017 CLINICAL DATA:  Chest and epigastric pain. EXAM: CHEST  2 VIEW COMPARISON:  03/08/2014 FINDINGS: The heart size and mediastinal contours are within normal limits. Severe chronic lung disease again noted with diffuse interstitial prominence and hyperinflation present. There is no evidence of pulmonary edema, consolidation, pneumothorax, nodule or pleural fluid. The visualized skeletal structures are unremarkable. IMPRESSION: Stable chronic lung disease.  No acute findings. Electronically Signed   By: Irish LackGlenn  Yamagata M.D.   On: 03/11/2017 14:50   Ct Angio Chest/abd/pel For Dissection W And/or Wo Contrast  Result Date: 03/11/2017 CLINICAL DATA:  Chest and abdominal pain EXAM: CT ANGIOGRAPHY CHEST, ABDOMEN AND PELVIS TECHNIQUE: Multidetector CT imaging through the chest,  abdomen and pelvis was performed using the standard protocol during bolus administration of intravenous contrast. Multiplanar reconstructed images and MIPs were obtained and reviewed to evaluate the vascular anatomy. CONTRAST:  80mL ISOVUE-370 IOPAMIDOL (ISOVUE-370) INJECTION 76% COMPARISON:  03/11/2017, 12/03/2016 FINDINGS: CTA CHEST FINDINGS Cardiovascular: The thoracic aorta and its branches demonstrate atherosclerotic calcifications. No aneurysmal dilatation or dissection is identified. Mild coronary calcifications are noted. No cardiac enlargement is seen. The pulmonary artery is incompletely opacified although no large central pulmonary embolus is seen. There are multiple neck and chest wall collaterals identified on the right following contrast injection on the right related to stenosis of the right subclavian and right innominate veins. The left innominate vein and superior vena cava are widely patent. Mediastinum/Nodes: The thoracic inlet is otherwise within normal limits. No hilar or mediastinal adenopathy is seen. The esophagus is within normal limits. Lungs/Pleura: The lungs are well aerated bilaterally and demonstrate diffuse emphysematous changes. Scattered calcified and noncalcified nodules are noted consistent with prior granulomatous disease. Some scarring is noted in the right lower lobe. Minimal atelectatic changes are noted in the lingula. No sizable effusion or pneumothorax is noted. Musculoskeletal: Mild degenerative changes of the thoracic spine are noted. Review of the MIP images confirms the above findings. CTA ABDOMEN AND PELVIS FINDINGS VASCULAR Aorta: Diffuse aortic atherosclerotic calcifications are noted without aneurysmal dilatation. This extends into the iliac arteries bilaterally. No focal stenoses are seen. Celiac: Atherosclerotic changes at the origin the celiac axis are noted. Some median are quit ligament compression is noted with mild poststenotic dilatation. SMA: Calcific  changes are noted the proximal superior mesenteric artery without focal hemodynamically significant stenosis. Renals: Tiny native renal arteries are identified bilaterally. Renal transplant is noted in the right lower quadrant with a widely patent renal artery arising from the external iliac artery on the right. IMA: Patent Veins: No specific venous abnormality is noted. Review of the MIP images confirms the above findings. NON-VASCULAR Hepatobiliary: No focal liver abnormality is seen. No gallstones, gallbladder wall thickening, or biliary dilatation. Pancreas: Unremarkable. No pancreatic ductal dilatation or surrounding inflammatory changes. Spleen: Normal in size without focal abnormality. Adrenals/Urinary Tract: Native kidneys are small and shrunken consistent with the known  history of end-stage renal disease. A a renal transplant is noted in the left iliac fossa also small with diffuse calcifications consistent with transplant failure. A right iliac fossa transplant is seen with adequate perfusion. No obstructive changes are noted. A failed transplant is noted just above the intact transplant in the right mid abdomen. The bladder is well distended. Stomach/Bowel: No obstructive or inflammatory changes of the bowel are noted. The appendix is not well visualized although no inflammatory changes to suggest appendicitis are noted. Lymphatic: No significant lymphadenopathy is noted. Reproductive: Prostate is within normal limits. Other: No abdominal wall hernia or abnormality. No abdominopelvic ascites. Musculoskeletal: No acute abnormality noted. Review of the MIP images confirms the above findings. IMPRESSION: The thoracic and abdominal aorta demonstrate changes of atherosclerotic calcification without evidence of aneurysmal dilatation or dissection. No sizable pulmonary embolism is noted although opacification of the pulmonary artery is somewhat limited. 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 Multiple calcified and noncalcified nodules within the lungs. This is most consistent with prior granulomatous disease. Changes consistent with renal transplant in the right iliac fossa. 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 The native kidneys are shrunken consistent with end-stage renal disease. Electronically Signed   By: Alcide Clever M.D.   On: 03/11/2017 16:27    Procedures Procedures (including critical care time)  Medications Ordered in ED Medications  hydrogen peroxide 3 % external solution (not administered)  0.9 %  sodium chloride infusion ( Intravenous New Bag/Given 03/11/17 1659)  HYDROmorphone (DILAUDID) injection 1 mg (1 mg Intravenous Given 03/11/17 1517)  ondansetron (ZOFRAN) injection 4 mg (4 mg Intravenous Given 03/11/17 1519)  sodium chloride 0.9 % bolus 1,000 mL (0 mLs Intravenous Stopped 03/11/17 1655)  gi cocktail (Maalox,Lidocaine,Donnatal) (30 mLs Oral Given 03/11/17 1517)  iopamidol (ISOVUE-370) 76 % injection 80 mL (80 mLs Intravenous Contrast Given 03/11/17 1555)  famotidine (PEPCID) IVPB 20 mg premix (0 mg Intravenous Stopped 03/11/17 1737)  pantoprazole (PROTONIX) injection 40 mg (40 mg Intravenous Given 03/11/17 1656)  HYDROmorphone (DILAUDID) injection 0.5 mg (0.5 mg Intravenous Given 03/11/17 1701)     Initial Impression / Assessment and Plan / ED Course  I have reviewed the triage vital signs and the nursing notes.  Pertinent labs & imaging results that were available during my care of the patient were reviewed by me and considered in my medical decision making (see chart for details).  Clinical Course as of Mar 11 1749  Sat Mar 11, 2017  1557 CT Angio Chest/Abd/Pel for Dissection W and/or Wo Contrast [BM]  1558 No free air seen on xray on my interpretation Renal function will tolerate contrast - hydration given as well.  [BM]    Clinical Course User  Index [BM] Eber Hong, MD   The patient is fairly hypertensive, his pain is very reproducible and with radiation to the back it suggests either a stomach ulcer, pancreatitis, colonic process or possibly some complication of his prior renal transplants.  Based on his laboratory workup his labs are fairly unremarkable, this includes electrolytes, renal function with a creatinine of 0.99, white blood cell count of 6700.  Lipase was not initially ordered by nursing staff so this was added on.  EKG does not show acute ischemia.  The patient will need a chest x-ray because of his abnormal lung sounds and a CT scan of the abdomen and pelvis.  He did have a  CT scan approximately 3 months ago which did not reveal any acute findings at that time.  Other etiologies I would consider would be an aortic dissection however with his reproducible tenderness it suggest that it is more of a visceral etiology than vascular.  His pulses are equal at the radial arteries as well as the pedal arteries.  Pt informed of all the important findings including the venous abnormalitiesties on CT.  He is better - no signs of perforation, pancreatitis or other biliary disease - has no aneurysm and no disection or abnormal lung findings - trop neg - pt informed of all findings and stable for d/c.    Final Clinical Impressions(s) / ED Diagnoses   Final diagnoses:  Epigastric pain    ED Discharge Orders        Ordered    ranitidine (ZANTAC) 150 MG tablet  2 times daily     03/11/17 1742    sucralfate (CARAFATE) 1 g tablet  3 times daily with meals & bedtime     03/11/17 1742       Eber Hong, MD 03/11/17 1750

## 2017-03-11 NOTE — ED Triage Notes (Signed)
Pt reports epigastric pain radiating into chest and through back started yesterday afternoon while sitting. States CP has gotten worse in last hour. Endorses SOB, N/, dizziness, and back pain.

## 2017-03-11 NOTE — ED Notes (Signed)
Pt transported to CT ?

## 2017-11-15 DIAGNOSIS — T8612 Kidney transplant failure: Secondary | ICD-10-CM | POA: Diagnosis present

## 2018-10-29 ENCOUNTER — Other Ambulatory Visit: Payer: Medicare Other

## 2018-10-29 ENCOUNTER — Other Ambulatory Visit: Payer: Self-pay | Admitting: Internal Medicine

## 2018-10-29 DIAGNOSIS — Z20822 Contact with and (suspected) exposure to covid-19: Secondary | ICD-10-CM

## 2018-11-03 LAB — NOVEL CORONAVIRUS, NAA: SARS-CoV-2, NAA: NOT DETECTED

## 2018-11-05 ENCOUNTER — Telehealth: Payer: Self-pay | Admitting: General Practice

## 2018-11-05 NOTE — Telephone Encounter (Signed)
Pt was given covid-19 result(not detected)/ Pt verbalized understanding

## 2019-03-28 ENCOUNTER — Ambulatory Visit (HOSPITAL_BASED_OUTPATIENT_CLINIC_OR_DEPARTMENT_OTHER): Payer: Medicare Other | Admitting: Internal Medicine

## 2021-05-17 ENCOUNTER — Other Ambulatory Visit: Payer: Self-pay

## 2021-05-17 ENCOUNTER — Observation Stay (HOSPITAL_COMMUNITY)
Admission: EM | Admit: 2021-05-17 | Discharge: 2021-05-18 | Disposition: A | Discharge status: Home / Self Care | Payer: No Typology Code available for payment source | Attending: Emergency Medicine | Admitting: Emergency Medicine

## 2021-05-17 ENCOUNTER — Emergency Department (HOSPITAL_COMMUNITY): Payer: No Typology Code available for payment source

## 2021-05-17 ENCOUNTER — Encounter (HOSPITAL_COMMUNITY): Payer: Self-pay | Admitting: *Deleted

## 2021-05-17 DIAGNOSIS — I4891 Unspecified atrial fibrillation: Principal | ICD-10-CM | POA: Insufficient documentation

## 2021-05-17 DIAGNOSIS — F17210 Nicotine dependence, cigarettes, uncomplicated: Secondary | ICD-10-CM | POA: Diagnosis not present

## 2021-05-17 DIAGNOSIS — R0602 Shortness of breath: Secondary | ICD-10-CM | POA: Insufficient documentation

## 2021-05-17 DIAGNOSIS — J441 Chronic obstructive pulmonary disease with (acute) exacerbation: Secondary | ICD-10-CM | POA: Diagnosis present

## 2021-05-17 DIAGNOSIS — Z8616 Personal history of COVID-19: Secondary | ICD-10-CM | POA: Insufficient documentation

## 2021-05-17 DIAGNOSIS — Z79899 Other long term (current) drug therapy: Secondary | ICD-10-CM | POA: Insufficient documentation

## 2021-05-17 DIAGNOSIS — Z940 Kidney transplant status: Secondary | ICD-10-CM | POA: Diagnosis not present

## 2021-05-17 DIAGNOSIS — Z20822 Contact with and (suspected) exposure to covid-19: Secondary | ICD-10-CM | POA: Diagnosis not present

## 2021-05-17 DIAGNOSIS — I10 Essential (primary) hypertension: Secondary | ICD-10-CM | POA: Diagnosis present

## 2021-05-17 DIAGNOSIS — J449 Chronic obstructive pulmonary disease, unspecified: Secondary | ICD-10-CM

## 2021-05-17 DIAGNOSIS — U071 COVID-19: Secondary | ICD-10-CM | POA: Diagnosis not present

## 2021-05-17 DIAGNOSIS — Z7982 Long term (current) use of aspirin: Secondary | ICD-10-CM | POA: Diagnosis not present

## 2021-05-17 HISTORY — DX: Chronic obstructive pulmonary disease with (acute) exacerbation: J44.1

## 2021-05-17 HISTORY — DX: Acute myocardial infarction, unspecified: I21.9

## 2021-05-17 LAB — BASIC METABOLIC PANEL
Anion gap: 9 (ref 5–15)
BUN: 13 mg/dL (ref 8–23)
CO2: 24 mmol/L (ref 22–32)
Calcium: 8.4 mg/dL — ABNORMAL LOW (ref 8.9–10.3)
Chloride: 98 mmol/L (ref 98–111)
Creatinine, Ser: 0.95 mg/dL (ref 0.61–1.24)
GFR, Estimated: >60 mL/min (ref >60)
Glucose, Bld: 153 mg/dL — ABNORMAL HIGH (ref 70–99)
Potassium: 4 mmol/L (ref 3.5–5.1)
Sodium: 131 mmol/L — ABNORMAL LOW (ref 135–145)

## 2021-05-17 LAB — CBC
HCT: 49.6 % (ref 39.0–52.0)
Hemoglobin: 16.5 g/dL (ref 13.0–17.0)
MCH: 31.1 pg (ref 26.0–34.0)
MCHC: 33.3 g/dL (ref 30.0–36.0)
MCV: 93.6 fL (ref 80.0–100.0)
Platelets: 161 10*3/uL (ref 150–400)
RBC: 5.3 MIL/uL (ref 4.22–5.81)
RDW: 13.4 % (ref 11.5–15.5)
WBC: 7.3 10*3/uL (ref 4.0–10.5)
nRBC: 0 % (ref 0.0–0.2)

## 2021-05-17 LAB — MAGNESIUM: Magnesium: 1.7 mg/dL (ref 1.7–2.4)

## 2021-05-17 LAB — BRAIN NATRIURETIC PEPTIDE: B Natriuretic Peptide: 573 pg/mL — ABNORMAL HIGH (ref 0.0–100.0)

## 2021-05-17 LAB — RESP PANEL BY RT-PCR (FLU A&B, COVID) ARPGX2
Influenza A by PCR: NEGATIVE
Influenza B by PCR: NEGATIVE
SARS Coronavirus 2 by RT PCR: POSITIVE — AB

## 2021-05-17 LAB — TROPONIN I (HIGH SENSITIVITY)
Troponin I (High Sensitivity): 115 ng/L (ref <18)
Troponin I (High Sensitivity): 135 ng/L (ref <18)
Troponin I (High Sensitivity): 147 ng/L (ref <18)

## 2021-05-17 LAB — TSH: TSH: 2.261 u[IU]/mL (ref 0.350–4.500)

## 2021-05-17 LAB — D-DIMER, QUANTITATIVE: D-Dimer, Quant: 2.12 ug/mL-FEU — ABNORMAL HIGH (ref 0.00–0.50)

## 2021-05-17 MED ORDER — ACETAMINOPHEN 650 MG RE SUPP: 650.0000 mg | Freq: Four times a day (QID) | RECTAL | Status: DC | PRN

## 2021-05-17 MED ORDER — DILTIAZEM HCL 25 MG/5ML IV SOLN
10.0000 mg | Freq: Once | INTRAVENOUS | Status: AC
  Administered 2021-05-17: 10 mg via INTRAVENOUS
  Filled 2021-05-17: qty 5

## 2021-05-17 MED ORDER — LABETALOL HCL 200 MG PO TABS
100.0000 mg | ORAL_TABLET | Freq: Every evening | ORAL | Status: DC
  Administered 2021-05-17: 100 mg via ORAL
  Filled 2021-05-17: qty 1

## 2021-05-17 MED ORDER — METOPROLOL TARTRATE 5 MG/5ML IV SOLN: 5.0000 mg | INTRAVENOUS | Status: DC | PRN

## 2021-05-17 MED ORDER — ACETAMINOPHEN 325 MG PO TABS: 650.0000 mg | ORAL_TABLET | Freq: Four times a day (QID) | ORAL | Status: DC | PRN

## 2021-05-17 MED ORDER — ALBUTEROL SULFATE HFA 108 (90 BASE) MCG/ACT IN AERS: 1.0000 | INHALATION_SPRAY | Freq: Four times a day (QID) | RESPIRATORY_TRACT | Status: DC | PRN

## 2021-05-17 MED ORDER — ENOXAPARIN SODIUM 40 MG/0.4ML IJ SOSY
40.0000 mg | PREFILLED_SYRINGE | INTRAMUSCULAR | Status: DC
  Administered 2021-05-17: 40 mg via SUBCUTANEOUS
  Filled 2021-05-17: qty 0.4

## 2021-05-17 MED ORDER — PREDNISONE 10 MG PO TABS
10.0000 mg | ORAL_TABLET | Freq: Once | ORAL | Status: DC
  Administered 2021-05-18: 10:00:00 10 mg via ORAL
  Filled 2021-05-17: qty 1

## 2021-05-17 MED ORDER — SODIUM CHLORIDE 0.9 % IV BOLUS
500.0000 mL | Freq: Once | INTRAVENOUS | Status: AC
  Administered 2021-05-17: 500 mL via INTRAVENOUS

## 2021-05-17 MED ORDER — TACROLIMUS 1 MG PO CAPS: 1.0000 mg | ORAL_CAPSULE | Freq: Two times a day (BID) | ORAL | Status: DC

## 2021-05-17 MED ORDER — TACROLIMUS 0.5 MG PO CAPS
1.5000 mg | ORAL_CAPSULE | Freq: Every morning | ORAL | Status: DC
  Filled 2021-05-17 (×3): qty 3

## 2021-05-17 MED ORDER — POLYETHYLENE GLYCOL 3350 17 G PO PACK
17.0000 g | PACK | Freq: Every day | ORAL | Status: DC | PRN
Start: 2021-05-17 — End: 2021-05-18

## 2021-05-17 MED ORDER — TACROLIMUS 1 MG PO CAPS
2.0000 mg | ORAL_CAPSULE | Freq: Every evening | ORAL | Status: DC
  Administered 2021-05-17: 2 mg via ORAL
  Filled 2021-05-17 (×2): qty 2

## 2021-05-17 MED ORDER — PROMETHAZINE HCL 12.5 MG PO TABS: 12.5000 mg | ORAL_TABLET | Freq: Four times a day (QID) | ORAL | Status: DC | PRN

## 2021-05-17 MED ORDER — PREDNISONE 10 MG PO TABS: 5.0000 mg | ORAL_TABLET | Freq: Every day | ORAL | Status: DC

## 2021-05-17 MED ORDER — IOHEXOL 350 MG/ML SOLN
100.0000 mL | Freq: Once | INTRAVENOUS | Status: AC | PRN
  Administered 2021-05-17: 80 mL via INTRAVENOUS

## 2021-05-17 MED ORDER — ASPIRIN 325 MG PO TABS: 325.0000 mg | ORAL_TABLET | Freq: Every day | ORAL | Status: DC

## 2021-05-17 NOTE — H&P (Addendum)
History and Physical    Calvin Carroll [id number] DOB: [birth year] DOA: 05/17/2021  PCP: [facility]   Patient coming from: Home  I have personally briefly reviewed patient's old medical records in [place]  Chief Complaint: Chest pressure, difficulty breathing  HPI: Calvin Carroll is a 66 y.o. male with medical history significant for COPD, hypertension, renal transplant. Patient presented to the ED with complaints of chest pressure, difficulty breathing and feeling his heart racing today.  Reports he checks his blood pressure, it was 98/68, and his heart rate was 158.  He describes chest pressure to the left side of his chest, nonradiating. No lower extremity swelling.  He has been using his albuterol multiple times a day over the past 3 days.  He tested positive for COVID 1/17 a week ago, he has been having symptoms of nasal congestion for about 3 days, with fatigue.  He otherwise was mostly asymptomatic from [place]. He was prescribed steroids and Levaquin.  He completed 4 doses of COVID-vaccine.  He is not on home O2  ED Course: Heart rate is 132, improved after 10 mg Cardizem given.  Respiratory rate 21-26.  Blood pressure down to 90/64.  500 mill bolus given with improvement in her blood pressure.  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 Hospitalist to admit for new onset atrial fibrillation.  Review of Systems: As per HPI all other systems reviewed and negative.  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 He has been smoking an average of 1.5 packs per day. He has never used smokeless tobacco. He reports current alcohol use. He reports that he does not use drugs.  No Known Allergies  Prior to Admission medications   Medication Sig Start Date End Date Taking? Authorizing Provider  levofloxacin (LEVAQUIN) 500 MG tablet Take by mouth. 05/11/21 05/21/21 Yes [provider]  montelukast (SINGULAIR) 10 MG tablet Take by mouth. 03/23/10  Yes [provider]  mycophenolate (MYFORTIC) 360 MG TBEC EC tablet Take by mouth. 03/23/10  Yes [provider]  nirmatrelvir & ritonavir (PAXLOVID) 20 x 150 MG & 10 x 100MG  TBPK See package instructions. 05/12/21  Yes [provider]  omeprazole (PRILOSEC) 20 MG capsule Take by mouth. 03/23/10  Yes [provider]  predniSONE (DELTASONE) 10 MG tablet Take 4 po qd x 2d then 3 po qd x 2d then 2 po qd x 2d then 1 po qd x 2d then stop 05/11/21  Yes [provider]  albuterol (PROVENTIL HFA;VENTOLIN HFA) 108 (90 Base) MCG/ACT inhaler Inhale 1-2 puffs into the lungs every 6 (six) hours as needed for wheezing or shortness of breath.    [provider]  aspirin 325 MG tablet Take 325 mg by mouth daily.    [provider]  atorvastatin (LIPITOR) 10 MG tablet Take 10 mg by mouth at bedtime.  [provider]  Cholecalciferol (VITAMIN D-3) 1000 units CAPS Take 1 capsule by mouth daily.    [provider]  cyclobenzaprine (FLEXERIL) 10 MG tablet Take 10 mg at bedtime by mouth.    [provider]  fexofenadine (ALLEGRA) 180 MG tablet Take 180 mg by mouth daily.    [provider]  flunisolide (NASALIDE) 25 MCG/ACT (0.025%) SOLN Place 2 sprays into the nose daily as needed (nasal congestion).    [provider]  fluticasone (FLONASE) 50 MCG/ACT nasal spray Place  1 spray into both nostrils daily.    [provider]  HYDROcodone-acetaminophen (NORCO) 10-325 MG tablet Take 1 tablet by mouth 2 (two) times daily as needed for moderate pain.    [provider]  labetalol (NORMODYNE) 100 MG tablet Take by mouth.    [provider]  labetalol (NORMODYNE) 200 MG tablet Take 200 mg by mouth 2 (two) times daily.    [provider]  levofloxacin (LEVAQUIN) 500 MG tablet Take 500 mg by mouth daily. 05/11/21   [provider]  lisinopril (PRINIVIL,ZESTRIL) 20 MG tablet Take 20 mg by mouth daily.    [provider]  lisinopril (ZESTRIL) 10 MG tablet Take by mouth.    [provider]  lisinopril (ZESTRIL) 20 MG tablet Take by mouth.    [provider]  mycophenolate (MYFORTIC) 360 MG TBEC EC tablet Take 360 mg by mouth 2 (two) times daily.     [provider]  pantoprazole (PROTONIX) 40 MG tablet Take 40 mg by mouth daily.    [provider]  predniSONE (DELTASONE) 5 MG tablet Take 5 mg by mouth daily with breakfast.    [provider]  ranitidine (ZANTAC) 150 MG tablet Take 1 tablet (150 mg total) 2 (two) times daily by mouth. 03/11/17   Noemi Chapel, MD  rosuvastatin (CRESTOR) 5 MG tablet Take by mouth.    [provider]  sucralfate (CARAFATE) 1 g tablet Take 1 tablet (1 g total) 4 (four) times daily -  with meals and at bedtime by mouth. 03/11/17   Noemi Chapel, MD  tacrolimus (PROGRAF) 1 MG capsule Take 2 mg by mouth 2 (two) times daily.    [provider]  tiotropium (SPIRIVA) 18 MCG inhalation capsule Place 18 mcg into inhaler and inhale daily.    [provider]  vitamin B-12 (CYANOCOBALAMIN) 1000 MCG tablet Take 1,000 mcg by mouth daily.    [provider]    Physical Exam: Vitals:   05/17/21 1600 05/17/21 1615 05/17/21 1630 05/17/21 1700  BP: 99/78 109/84 102/77 108/69  Pulse: 92 85 82 83  Resp: (!) 25 12 (!) 23 (!) 23   Temp:      TempSrc:      SpO2: 97% 97% 98% 97%  Weight:      Height:        Constitutional: NAD, calm, comfortable Vitals:   05/17/21 1600 05/17/21 1615 05/17/21 1630 05/17/21 1700  BP: 99/78 109/84 102/77 108/69  Pulse: 92 85 82 83  Resp: (!) 25 12 (!) 23 (!) 23  Temp:      TempSrc:      SpO2: 97% 97% 98% 97%  Weight:      Height:       Eyes: PERRL, lids and conjunctivae normal ENMT: Mucous membranes are moist.   Neck: normal, supple, no masses, no thyromegaly Respiratory: clear to auscultation bilaterally, no wheezing, no crackles. Normal respiratory effort. No accessory muscle use.  Cardiovascular: Regular rate and rhythm, no murmurs / rubs / gallops. No extremity edema. 2+ pedal pulses.   Abdomen: no tenderness, no masses palpated. No hepatosplenomegaly. Bowel sounds positive.  Musculoskeletal: no clubbing / cyanosis. No joint deformity upper and lower extremities.  Skin: no rashes, lesions, ulcers. No induration Neurologic: No apparent cranial nerve abnormalities, moving extremities spontaneously.[name] Kitchen  Psychiatric: Normal judgment and insight. Alert and oriented x 3. Normal mood.   Labs on Admission: I have personally reviewed following labs and imaging studies  CBC: Recent Labs  Lab 05/17/21 1455  WBC 7.3  HGB 16.5  HCT 49.6  MCV 93.6  PLT [id number]   Basic Metabolic Panel: Recent Labs  Lab 05/17/21 1455  NA 131*  K 4.0  CL 98  CO2 24  GLUCOSE 153*  BUN 13  CREATININE 0.95  CALCIUM 8.4*  MG 1.7   Thyroid Function Tests: Recent Labs    05/17/21 1455  TSH 2.261   Radiological Exams on Admission: DG Chest Portable 1 View  Result Date: 05/17/2021 CLINICAL DATA:  Increased sob/a fib/copd/htn/hx MI/smoker, Dx with covid a week ago EXAM: PORTABLE CHEST 1 VIEW COMPARISON:  03/11/2017 FINDINGS: Old granulomatous disease. Atherosclerotic calcification of the aortic arch. Heart size within normal limits. Moderate and increased interstitial accentuation with some  Ker[place] a lines. Emphysema. IMPRESSION: 1. Progressive interstitial accentuation with normal heart size. Although some of this may reflect progression of chronic interstitial lung disease, a component of superimposed atypical pneumonia is not excluded. 2. Aortic Atherosclerosis (ICD10-I70.0) and Emphysema (ICD10-J43.9). 3. Old granulomatous disease. Electronically Signed   By: Van Clines M.D.   On: 05/17/2021 15:28    EKG: Independently reviewed.  Atrial fibrillation rate 121, QTc 498.  No significant ST or T wave changes.  Assessment/Plan Principal Problem:   Atrial fibrillation with rapid ventricular response (HCC) Active Problems:   Renal transplant recipient   HTN (hypertension)   COPD (chronic obstructive pulmonary disease) ([place])   COVID-19 virus infection   Atrial fibrillation with RVR- rates up to 132, converted to sinus rhythm heart rate 60-80s after 10 mg of Cardizem.  Symptoms started today. Rate currently 69 and in sinus rhythm.  Patient reports a history of MI-but per records 2011, during hospitalization for his second renal transplant, he complained of chest pain, troponins were elevated, echocardiogram was normal cardiology thought it was demand ischemia due to recent surgery. He denies falls, no GI blood loss history. - With CHADsVAsc score- 1 for HTN hx, would be 2 if he actually had a confirmed diagnosis of myocardial infarction.  He is currently in sinus rhythm.  In the setting of recent COVID-19 infection, with atrial fibrillation lasting less than a day, anticoagulation has been deferred at this time, consider cardiology consultation/input. -Obtain echocardiogram -TSH, magnesium normal -Resume home labetalol 100 daily - PRN IV metoprolol 5mg  for heart rate greater than 110 -Resume home aspirin 325 mg daily  Recent COVID-19 infection- reports nasal congestion, which have mostly resolved.  Tested +1/24. He has baseline dyspnea from COPD that is unchanged.  Denies cough.   Received 4 doses of COVID vaccine.  Was treated with prednisone and Levaquin as outpatient.  Did not get specific COVID treatment.   - CTA chest showing filling defects thought likely secondary to chronic post thrombotic change and slow flow.  Also right lower lobe opacity infection or aspiration.  -Was prescribed Levaquin as outpatient. - As symptoms are improving, he is afebrile without leukocytosis, hold further antibiotics for now. - Today  is Day 10 since onset of symptoms, 7th since +ve test, will continue precautions for now.  Hypertension-blood pressure 90s to 109.  Improved after 500 mill bolus. -Hold lisinopril 40 mg, with low blood pressure and contrast exposure -Resume labetalol 100 daily  COPD-stable. -As needed albuterol inhaler  History of renal transplant -Resume tacrolimus, mycophenolate - He is completing a prednisone taper for recent COVID infection, resume 10 mg daily -Daily 5 mg prednisone held for now  DVT prophylaxis: Lovenox Code Status: Code Family Communication: Significant other- Mona at bedside, is a Furniture conservator/restorer. Disposition Plan: ~ 1 - 2 days Consults called: None Admission status: Inpt, step down I certify that at the point of admission it is my clinical judgment that the patient will require inpatient hospital care spanning beyond 2 midnights from the point of admission due to high intensity of service, high risk for further deterioration and high frequency of surveillance required.    Bethena Roys MD [facility]  05/17/2021, 7:53 PM

## 2021-05-17 NOTE — ED Notes (Signed)
Patient diagnosed with Covid this past Monday.

## 2021-05-17 NOTE — ED Provider Notes (Signed)
[facility] EMERGENCY DEPARTMENT Provider Note   CSN: [id number] Arrival date & time: 05/17/21  1427     History  Chief Complaint  Patient presents with   Calvin Carroll is a 66 y.o. male who presents to the emergency department with new onset A. fib with RVR.  Patient has a past medical history of renal transplant on mycophenolate and tacrolimus.  He states that he had a cold last week and was diagnosed at the [facility] hospital with COVID-19 infection.  He was given antibiotics and prednisone but has had some persistent congestion.  This morning he awoke feeling extremely short of breath with pressure on his chest.  He took his blood pressure and noted that he was hypotensive with a systolic pressure in the 0000000 and also had heart rate of about 150.  He went to the urgent care and was immediately sent here.  He takes a daily aspirin but is not on any anticoagulation.  He states that he felt similarly last week before going to the [place] to get checked out.  He has never had anything like this before.  He denies active chest pain.   Calvin Fibrillation      Home Medications Prior to Admission medications   Medication Sig Start Date End Date Taking? Authorizing Provider  albuterol (PROVENTIL HFA;VENTOLIN HFA) 108 (90 Base) MCG/ACT inhaler Inhale 1-2 puffs into the lungs every 6 (six) hours as needed for wheezing or shortness of breath.   Yes [provider]  aspirin 325 MG tablet Take 325 mg by mouth daily.   Yes [provider]  Cholecalciferol (VITAMIN D-3) 1000 units CAPS Take 1 capsule by mouth daily.   Yes [provider]  flunisolide (NASALIDE) 25 MCG/ACT (0.025%) SOLN Place 2 sprays into the nose daily as needed (nasal congestion).   Yes [provider]  labetalol (NORMODYNE) 100 MG tablet Take 100 mg by mouth every evening.   Yes [provider]  levofloxacin (LEVAQUIN) 500 MG tablet Take 500 mg by mouth daily. 05/11/21   Yes [provider]  lisinopril (ZESTRIL) 20 MG tablet Take 40 mg by mouth every evening.   Yes [provider]  omeprazole (PRILOSEC) 20 MG capsule Take by mouth. 03/23/10  Yes [provider]  predniSONE (DELTASONE) 10 MG tablet Take 10 mg by mouth See admin instructions. Take 4 tabs x 2 days, 3 tabs x 2 days, 2 tabs x 2 days, then take 1 tablet every day for 2 days then stop. 05/11/21  Yes [provider]  tacrolimus (PROGRAF) 1 MG capsule Take 1 mg by mouth 2 (two) times daily. Take 1 and 1/2  in the morning and 2 tablets every evening   Yes [provider]  tiotropium (SPIRIVA) 18 MCG inhalation capsule Place 18 mcg into inhaler and inhale daily.   Yes [provider]  predniSONE (DELTASONE) 5 MG tablet Take 5 mg by mouth daily with breakfast.    [provider]  sucralfate (CARAFATE) 1 g tablet Take 1 tablet (1 g total) 4 (four) times daily -  with meals and at bedtime by mouth. Patient not taking: Reported on 05/17/2021 03/11/17   Noemi Chapel, MD      Allergies    Patient has no known allergies.    Review of Systems   Review of Systems As per the HPI Physical Exam Updated Vital Signs BP 109/75    Pulse 69    Temp 98.8  F (37.1 C) (Oral)    Resp (!) 26    Ht 5\' 8"  (1.727 m)    Wt 50.8 kg    SpO2 98%    BMI 17.03 kg/m  Physical Exam Vitals and nursing note reviewed.  Constitutional:      General: He is not in acute distress.    Appearance: He is well-developed. He is ill-appearing. He is not diaphoretic.  HENT:     Head: Normocephalic and atraumatic.  Eyes:     General: No scleral icterus.    Conjunctiva/sclera: Conjunctivae normal.  Cardiovascular:     Rate and Rhythm: Tachycardia present. Rhythm irregular.     Heart sounds: Normal heart sounds.     Comments: Impressive venous distention across the abdomen arms chest and face Pulmonary:     Effort: Pulmonary effort is normal. No respiratory distress.     Breath  sounds: Normal breath sounds.  Abdominal:     Palpations: Abdomen is soft.     Tenderness: There is no abdominal tenderness.  Musculoskeletal:     Cervical back: Normal range of motion and neck supple.  Skin:    General: Skin is warm and dry.  Neurological:     Mental Status: He is alert.  Psychiatric:        Behavior: Behavior normal.    ED Results / Procedures / Treatments   Labs (all labs ordered are listed, but only abnormal results are displayed) Labs Reviewed  RESP PANEL BY RT-PCR (FLU A&B, COVID) ARPGX2 - Abnormal; Notable for the following components:      Result Value   SARS Coronavirus 2 by RT PCR POSITIVE (*)    All other components within normal limits  BASIC METABOLIC PANEL - Abnormal; Notable for the following components:   Sodium 131 (*)    Glucose, Bld 153 (*)    Calcium 8.4 (*)    All other components within normal limits  BRAIN NATRIURETIC PEPTIDE - Abnormal; Notable for the following components:   B Natriuretic Peptide 573.0 (*)    All other components within normal limits  D-DIMER, QUANTITATIVE - Abnormal; Notable for the following components:   D-Dimer, Quant 2.12 (*)    All other components within normal limits  TROPONIN I (HIGH SENSITIVITY) - Abnormal; Notable for the following components:   Troponin I (High Sensitivity) 115 (*)    All other components within normal limits  TROPONIN I (HIGH SENSITIVITY) - Abnormal; Notable for the following components:   Troponin I (High Sensitivity) 135 (*)    All other components within normal limits  MAGNESIUM  CBC  TSH  HIV ANTIBODY (ROUTINE TESTING W REFLEX)  BASIC METABOLIC PANEL  CBC  TROPONIN I (HIGH SENSITIVITY)    EKG EKG Interpretation  Date/Time:  Monday May 17 2021 15:27:36 EST Ventricular Rate:  98 PR Interval:    QRS Duration: 85 QT Interval:  374 QTC Calculation: 478 R Axis:   80 Text Interpretation: Calvin fibrillation Borderline repolarization abnormality Borderline prolonged QT  interval New since previous tracing Confirmed by Fredia Sorrow [id number]) on 05/17/2021 4:27:29 PM  Radiology CT Angio Chest PE W and/or Wo Contrast  Result Date: 05/17/2021 CLINICAL DATA:  Concern for pulmonary embolus EXAM: CT ANGIOGRAPHY CHEST WITH CONTRAST TECHNIQUE: Multidetector CT imaging of the chest was performed using the standard protocol during bolus administration of intravenous contrast. Multiplanar CT image reconstructions and MIPs were obtained to evaluate the vascular anatomy. RADIATION DOSE REDUCTION: This exam was performed according to the departmental dose-optimization  program which includes automated exposure control, adjustment of the mA and/or kV according to patient size and/or use of iterative reconstruction technique. CONTRAST:  33mL OMNIPAQUE IOHEXOL 350 MG/ML SOLN COMPARISON:  CT chest angio dated March 11, 2017 FINDINGS: Cardiovascular: Adequate contrast opacification of the pulmonary arteries. 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 Additional ill-defined filling defects are seen distally in the right lower lobe segmental and subsegmental pulmonary arteries which are likely due to slow flow. Normal heart size. No pericardial effusion. Left main and three-vessel coronary artery calcifications. Atherosclerotic disease of the thoracic aorta. 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 Mediastinum/Nodes: Esophagus is unremarkable. Prominent right upper paratracheal lymph node located on image 34 is unchanged when compared with prior exam. Calcified mediastinal and hilar lymph nodes. No pathologically enlarged lymph nodes are seen in the chest. Lungs/Pleura: Central airways are patent. Centrilobular emphysema. Right lower lobe ground-glass opacity. Linear  opacities of the right lower lobe and left lingula, likely due to scarring or atelectasis. Small solid pulmonary nodules are stable. Reference nodule of the right upper lobe measuring 4 mm on series 6, image 81. Upper Abdomen: Atrophic kidneys.  No acute abnormality. Musculoskeletal: No chest wall abnormality. No acute or significant osseous findings. Review of the MIP images confirms the above findings. IMPRESSION: 1. 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 Additional ill-defined filling defects are seen distally in the right lower lobe segmental and subsegmental pulmonary arteries which are likely due to slow flow. 2. Right lower lobe ground-glass opacity, likely due to infection or aspiration. 3. Aortic Atherosclerosis (ICD10-I70.0) and Emphysema (ICD10-J43.9). Electronically Signed   By: Yetta Glassman M.D.   On: 05/17/2021 19:07   DG Chest Portable 1 View  Result Date: 05/17/2021 CLINICAL DATA:  Increased sob/a fib/copd/htn/hx MI/smoker, Dx with covid a week ago EXAM: PORTABLE CHEST 1 VIEW COMPARISON:  03/11/2017 FINDINGS: Old granulomatous disease. Atherosclerotic calcification of the aortic arch. Heart size within normal limits. Moderate and increased interstitial accentuation with some Kerley a lines. Emphysema. IMPRESSION: 1. Progressive interstitial accentuation with normal heart size. Although some of this may reflect progression of chronic interstitial lung disease, a component of superimposed atypical pneumonia is not excluded. 2. Aortic Atherosclerosis (ICD10-I70.0) and Emphysema (ICD10-J43.9). 3. Old granulomatous disease. Electronically Signed   By: Van Clines M.D.   On: 05/17/2021 15:28    Procedures Procedures   Medications Ordered in ED Medications  acetaminophen (TYLENOL) tablet 650 mg (has no administration in time range)    Or  acetaminophen (TYLENOL) suppository 650 mg (has no  administration in time range)  promethazine (PHENERGAN) tablet 12.5 mg (has no administration in time range)  polyethylene glycol (MIRALAX / GLYCOLAX) packet 17 g (has no administration in time range)  metoprolol tartrate (LOPRESSOR) injection 5 mg (has no administration in time range)  enoxaparin (LOVENOX) injection 40 mg (has no administration in time range)  albuterol (VENTOLIN HFA) 108 (90 Base) MCG/ACT inhaler 1-2 puff (has no administration in time range)  aspirin tablet 325 mg (has no administration in time range)  labetalol (NORMODYNE) tablet 100 mg (has no administration in time range)  predniSONE (DELTASONE) tablet 10 mg (has no administration in time range)  tacrolimus (PROGRAF) capsule 1 mg (has no administration in time range)  diltiazem (CARDIZEM)  injection 10 mg (10 mg Intravenous Given 05/17/21 1507)  sodium chloride 0.9 % bolus 500 mL (500 mLs Intravenous New Bag/Given 05/17/21 1540)  iohexol (OMNIPAQUE) 350 MG/ML injection 100 mL (80 mLs Intravenous Contrast Given 05/17/21 1831)    ED Course/ Medical Decision Making/ A&P Clinical Course as of 05/17/21 2047  Mon May 17, 2021  1528 Pulse Rate: 98 [AH]  1528 BP: 94/72 HR 98 after diltiazem- Bp stable. Wll repeat ekg.  [AH]  1529 repeat [AH]  1657 Discussed patient case with Dr. Pearson Grippe.  He says that he is able to get CT angiogram based on his current renal function [AH]    Clinical Course User Index [AH] Margarita Mail, PA-C                           Medical Decision Making Amount and/or Complexity of Data Reviewed Labs: ordered. Radiology: ordered.  Risk Prescription drug management. Decision regarding hospitalization.   This patient presents to the ED for concern of shortness of breath, this involves an extensive number of treatment options, and is a complaint that carries with it a high risk of complications and morbidity.  The differential diagnosis includes The emergent differential diagnosis for  shortness of breath includes, but is not limited to, Pulmonary edema, bronchoconstriction, Pneumonia, Pulmonary embolism, Pneumotherax/ Hemothorax, Dysrythmia, ACS.  New onset Calvin fibrillation with rapid ventricular response responsive to single dose of diltiazem.  Suspect he may have some mild pulmonary edema however chest x-ray negative for this.  History of recent COVID infection and COPD with potential for pulmonary embolus, CT pending   Co morbidities that complicate the patient evaluation  Recent COVID infection, COPD   Additional history obtained:  Additional history obtained from review of external record External records from outside source obtained and reviewed including review of visit today with outpatient urgent care   Lab Tests:CBC, D-dimer, BMP, BNP, respiratory panel, troponins and magnesium as well as TSH  I Ordered, and personally interpreted labs.  The pertinent results include: Elevated troponin level likely secondary to demand ischemia, respiratory panel positive for COVID, BNP elevated at 573, elevated D-dimer Imaging Studies ordered: Chest x-ray  I ordered imaging studies including chest X I independently visualized and interpreted imaging which showed no evidence of acute pulmonary edema I agree with the radiologist interpretation   Cardiac Monitoring:  The patient was maintained on a cardiac monitor.  I personally viewed and interpreted the cardiac monitored which showed an underlying rhythm of: Calvin fibrillation initially with rapid ventricular response now rate controlled   Medicines ordered and prescription drug management:  I ordered medication including diltiazem for rate control for A. fib with RVR Reevaluation of the patient after these medicines showed that the patient resolved I have reviewed the patients home medicines and have made adjustments as needed   Test Considered:  CT angiogram of the chest which is pending   Critical  Interventions:  Rate control with diltiazem   Consultations Obtained:  I requested consultation with the nephrologist,  and discussed lab and imaging findings as well as pertinent plan - they recommend: Okay to proceed with CT angiogram.  Case also discussed with Dr. Denton Brick who will admit the patient for the hospitalist service   Problem List / ED Course:  A. fib with RVR, shortness of   Reevaluation:  After the interventions noted above, I reevaluated the patient and found that they have :improved   Social Determinants of  Health:     Dispostion:  After consideration of the diagnostic results and the patients response to treatment, I feel that the patent would benefit from admission.  Final Clinical Impression(s) / ED Diagnoses Final diagnoses:  New onset a-fib ([place])  Calvin fibrillation with RVR [facility])    Rx / DC Orders ED Discharge Orders     None         Margarita Mail, PA-C 05/17/21 2047    Fredia Sorrow, MD 05/23/21 [phone]

## 2021-05-17 NOTE — ED Notes (Signed)
Date and time results received: 05/17/21 1756 (use smartphrase ".now" to insert current time)  Test: Troponin Critical Value: 135  Name of Provider Notified: Wendall Stade, MD

## 2021-05-17 NOTE — ED Notes (Signed)
Called to give report to ICU, RN stated that they were too busy at present to take report and would call when able

## 2021-05-17 NOTE — ED Triage Notes (Signed)
Pt brought in by [facility] from Urgent Care with c/o SOB. Pt was found to be in A. Fib with RVR with HR in the 120s. BP WNL per EMS.

## 2021-05-18 ENCOUNTER — Inpatient Hospital Stay (HOSPITAL_BASED_OUTPATIENT_CLINIC_OR_DEPARTMENT_OTHER): Payer: No Typology Code available for payment source

## 2021-05-18 DIAGNOSIS — J449 Chronic obstructive pulmonary disease, unspecified: Secondary | ICD-10-CM | POA: Diagnosis not present

## 2021-05-18 DIAGNOSIS — I4891 Unspecified atrial fibrillation: Secondary | ICD-10-CM

## 2021-05-18 DIAGNOSIS — R778 Other specified abnormalities of plasma proteins: Secondary | ICD-10-CM

## 2021-05-18 DIAGNOSIS — I10 Essential (primary) hypertension: Secondary | ICD-10-CM

## 2021-05-18 DIAGNOSIS — U071 COVID-19: Secondary | ICD-10-CM | POA: Diagnosis not present

## 2021-05-18 LAB — BASIC METABOLIC PANEL
Anion gap: 6 (ref 5–15)
BUN: 20 mg/dL (ref 8–23)
CO2: 26 mmol/L (ref 22–32)
Calcium: 8.6 mg/dL — ABNORMAL LOW (ref 8.9–10.3)
Chloride: 101 mmol/L (ref 98–111)
Creatinine, Ser: 0.97 mg/dL (ref 0.61–1.24)
GFR, Estimated: >60 mL/min (ref >60)
Glucose, Bld: 81 mg/dL (ref 70–99)
Potassium: 4 mmol/L (ref 3.5–5.1)
Sodium: 133 mmol/L — ABNORMAL LOW (ref 135–145)

## 2021-05-18 LAB — ECHOCARDIOGRAM COMPLETE
AR max vel: 1.77 cm2
AV Area VTI: 1.61 cm2
AV Area mean vel: 1.36 cm2
AV Mean grad: 7 mmHg
AV Peak grad: 12.4 mmHg
Ao pk vel: 1.76 m/s
Area-P 1/2: 2.09 cm2
Height: 68 in
MV VTI: 2.97 cm2
S' Lateral: 2.9 cm
Weight: 1792 oz

## 2021-05-18 LAB — CBC
HCT: 46.6 % (ref 39.0–52.0)
Hemoglobin: 15.3 g/dL (ref 13.0–17.0)
MCH: 31.2 pg (ref 26.0–34.0)
MCHC: 32.8 g/dL (ref 30.0–36.0)
MCV: 94.9 fL (ref 80.0–100.0)
Platelets: 150 10*3/uL (ref 150–400)
RBC: 4.91 MIL/uL (ref 4.22–5.81)
RDW: 13.6 % (ref 11.5–15.5)
WBC: 5.9 10*3/uL (ref 4.0–10.5)
nRBC: 0 % (ref 0.0–0.2)

## 2021-05-18 LAB — MRSA NEXT GEN BY PCR, NASAL: MRSA by PCR Next Gen: NOT DETECTED

## 2021-05-18 LAB — HIV ANTIBODY (ROUTINE TESTING W REFLEX): HIV Screen 4th Generation wRfx: NONREACTIVE

## 2021-05-18 MED ORDER — METOPROLOL SUCCINATE ER 25 MG PO TB24: 25.0000 mg | ORAL_TABLET | Freq: Every day | ORAL | 1 refills | Status: DC

## 2021-05-18 MED ORDER — METOPROLOL SUCCINATE ER 25 MG PO TB24: 25.0000 mg | ORAL_TABLET | Freq: Every day | ORAL | Status: DC

## 2021-05-18 MED ORDER — LISINOPRIL 10 MG PO TABS
40.0000 mg | ORAL_TABLET | Freq: Every day | ORAL | Status: DC
  Administered 2021-05-18: 10:00:00 40 mg via ORAL
  Filled 2021-05-18: qty 4

## 2021-05-18 MED ORDER — APIXABAN 5 MG PO TABS
5.0000 mg | ORAL_TABLET | Freq: Two times a day (BID) | ORAL | Status: DC
  Administered 2021-05-18: 10:00:00 5 mg via ORAL
  Filled 2021-05-18: qty 1

## 2021-05-18 MED ORDER — APIXABAN 5 MG PO TABS: 5.0000 mg | ORAL_TABLET | Freq: Two times a day (BID) | ORAL | 1 refills | Status: DC

## 2021-05-18 MED ORDER — CHLORHEXIDINE GLUCONATE CLOTH 2 % EX PADS
6.0000 | MEDICATED_PAD | Freq: Every day | CUTANEOUS | Status: DC
  Administered 2021-05-18: 10:00:00 6 via TOPICAL

## 2021-05-18 NOTE — Progress Notes (Signed)
Patient given d/c instructions & paperwork, confirmed understanding & denied any questions at this time, two 20G IV catheters removed from RIGHT forearm catheters intact & no complications noted at this time, patient ready for d/c home & waiting on transportation at this time

## 2021-05-18 NOTE — Discharge Summary (Signed)
Physician Discharge Summary  Calvin Carroll [id number] DOB: August 19, 1955 DOA: 05/17/2021  PCP: Lindell Noe Cardiology: [facility] Admit date: 05/17/2021 Discharge date: 05/18/2021  Admitted From:  Home  Disposition:  Home    Recommendations for Outpatient Follow-up:  Follow up with PCP in 1 weeks Follow up with cardiology in 1-2 weeks Follow up with cardiology to arrange outpatient stress test and cardiac monitor  Discharge Condition: STABLE   CODE STATUS: FULL  DIET: heart healthy    Brief Hospitalization Summary: Please see all hospital notes, images, labs for full details of the hospitalization. ADMISSION HPI:  Brief Admission History:   66 y.o. male with medical history significant for COPD, hypertension, renal transplant.  Patient presented to the ED with complaints of chest pressure, difficulty breathing and feeling his heart racing today.  Reports he checks his blood pressure, it was 98/68, and his heart rate was 158.  He describes chest pressure to the left side of his chest, nonradiating.  No lower extremity swelling.  He has been using his albuterol multiple times a day over the past 3 days.  He tested positive for COVID 1/17 a week ago, he has been having symptoms of nasal congestion for about 3 days, with fatigue.  He otherwise was mostly asymptomatic from [place]. He was prescribed steroids and Levaquin.  He completed 4 doses of COVID-vaccine.  He is not on home O2.   Hospital Course by problem list   Atrial fibrillation with RVR - TREATED AND RESOLVED  - fortunately he responded favorably to 10 mg of cardizem  - it appears he has been having intermittent episodes of palpitations and dyspnea over past month - cardiology consulted and recommended starting apixaban 5 mg BID for CHAD2VASc of 2  - TTE completed: LVEF 55-60% with grade 1 DD. Left ventricular endocardial border not optimally defined to evaluate regional wall motion. There is mild concentric left  ventricular hypertrophy. - Pt was started on metoprolol XL 25 mg every evening for rate control.  He was started on apixaban for full anticoagulation.  I was going to have him stay another night to monitor with new medication changes but patient and spouse wanted to discharge home today.  He is feeling much better and has his own cardiologists with the [facility] and reports that he will follow up with them.  Will fax copy of DC summary to [place] in [place].      Elevated troponin - likely was demand ischemia in setting of new atrial fib with RVR - TTE:  LVEF 55-60% with grade 1 DD. Left ventricular endocardial border not optimally defined to evaluate regional wall motion. There is mild concentric left ventricular hypertrophy - pt decided he wanted to discharge home and will follow up with his cardiologist at [facility].   - I have recommended that he discuss an outpatient stress test and cardiac monitor with his primary cardiologist.     Essential hypertension  - soft BPs much improved now after fluid bolus - he is restarted on home lisinopril 40 mg daily - cardiology team changed labetalol to metoprolol XL 25 mg every evening   Covid Infection  - diagnosed 05/11/21 - he is fully vaccinated and boosted - he is completing a prednisone burst and completed course of levofloxacin  - he remains on isolation as he first tested positive 8 days ago.    COPD  - albuterol ordered as needed   S/p renal transplant - he remains on home tacrolimus, mycophenolate -  on 1/25 he will resume daily prednisone 5 mg daily    DVT prophylaxis: enoxaparin  Code Status: Full  Family Communication: spouse updated 1/24  Disposition: home   Discharge Diagnoses:  Principal Problem:   Atrial fibrillation with rapid ventricular response ([place]) Active Problems:   Renal transplant recipient   HTN (hypertension)   COPD (chronic obstructive pulmonary disease) ([place])   COVID-19 virus infection   Atrial fibrillation with RVR  ([place])   Discharge Instructions:  Allergies as of 05/18/2021   No Known Allergies      Medication List     STOP taking these medications    aspirin 325 MG tablet   labetalol 100 MG tablet Commonly known as: NORMODYNE   levofloxacin 500 MG tablet Commonly known as: LEVAQUIN   sucralfate 1 g tablet Commonly known as: Carafate       TAKE these medications    albuterol 108 (90 Base) MCG/ACT inhaler Commonly known as: VENTOLIN HFA Inhale 1-2 puffs into the lungs every 6 (six) hours as needed for wheezing or shortness of breath.   apixaban 5 MG Tabs tablet Commonly known as: ELIQUIS Take 1 tablet (5 mg total) by mouth 2 (two) times daily.   flunisolide 25 MCG/ACT (0.025%) Soln Commonly known as: NASALIDE Place 2 sprays into the nose daily as needed (nasal congestion).   lisinopril 20 MG tablet Commonly known as: ZESTRIL Take 40 mg by mouth every evening.   metoprolol succinate 25 MG 24 hr tablet Commonly known as: TOPROL-XL Take 1 tablet (25 mg total) by mouth at bedtime.   omeprazole 20 MG capsule Commonly known as: PRILOSEC Take by mouth.   predniSONE 5 MG tablet Commonly known as: DELTASONE Take 5 mg by mouth daily with breakfast. What changed: Another medication with the same name was removed. Continue taking this medication, and follow the directions you see here.   tacrolimus 1 MG capsule Commonly known as: PROGRAF Take 1 mg by mouth 2 (two) times daily. Take 1 mg (along with 0.5 mg capsule) in the morning and 2 capsules every evening   tacrolimus 0.5 MG capsule Commonly known as: PROGRAF Take 0.5 mg by mouth daily. Pt takes with 1 mg capsule in the morning.   tiotropium 18 MCG inhalation capsule Commonly known as: SPIRIVA Place 18 mcg into inhaler and inhale daily.   Vitamin D-3 25 MCG (1000 UT) Caps Take 1 capsule by mouth daily.        Follow-up Information     Clinic, [facility]. Schedule an appointment as soon as possible for a  visit in 1 week(s).   Why: Hospital Follow Up Contact information: [place] [address] [phone]         Cardiologist with [facility]. Schedule an appointment as soon as possible for a visit in 2 week(s).   Why: Hospital Follow Up               No Known Allergies Allergies as of 05/18/2021   No Known Allergies      Medication List     STOP taking these medications    aspirin 325 MG tablet   labetalol 100 MG tablet Commonly known as: NORMODYNE   levofloxacin 500 MG tablet Commonly known as: LEVAQUIN   sucralfate 1 g tablet Commonly known as: Carafate       TAKE these medications    albuterol 108 (90 Base) MCG/ACT inhaler Commonly known as: VENTOLIN HFA Inhale 1-2 puffs into the lungs every 6 (  six) hours as needed for wheezing or shortness of breath.   apixaban 5 MG Tabs tablet Commonly known as: ELIQUIS Take 1 tablet (5 mg total) by mouth 2 (two) times daily.   flunisolide 25 MCG/ACT (0.025%) Soln Commonly known as: NASALIDE Place 2 sprays into the nose daily as needed (nasal congestion).   lisinopril 20 MG tablet Commonly known as: ZESTRIL Take 40 mg by mouth every evening.   metoprolol succinate 25 MG 24 hr tablet Commonly known as: TOPROL-XL Take 1 tablet (25 mg total) by mouth at bedtime.   omeprazole 20 MG capsule Commonly known as: PRILOSEC Take by mouth.   predniSONE 5 MG tablet Commonly known as: DELTASONE Take 5 mg by mouth daily with breakfast. What changed: Another medication with the same name was removed. Continue taking this medication, and follow the directions you see here.   tacrolimus 1 MG capsule Commonly known as: PROGRAF Take 1 mg by mouth 2 (two) times daily. Take 1 mg (along with 0.5 mg capsule) in the morning and 2 capsules every evening   tacrolimus 0.5 MG capsule Commonly known as: PROGRAF Take 0.5 mg by mouth daily. Pt takes with 1 mg capsule in the morning.   tiotropium 18  MCG inhalation capsule Commonly known as: SPIRIVA Place 18 mcg into inhaler and inhale daily.   Vitamin D-3 25 MCG (1000 UT) Caps Take 1 capsule by mouth daily.        Procedures/Studies: CT Angio Chest PE W and/or Wo Contrast  Result Date: 05/17/2021 CLINICAL DATA:  Concern for pulmonary embolus EXAM: CT ANGIOGRAPHY CHEST WITH CONTRAST TECHNIQUE: Multidetector CT imaging of the chest was performed using the standard protocol during bolus administration of intravenous contrast. Multiplanar CT image reconstructions and MIPs were obtained to evaluate the vascular anatomy. RADIATION DOSE REDUCTION: This exam was performed according to the departmental dose-optimization program which includes automated exposure control, adjustment of the mA and/or kV according to patient size and/or use of iterative reconstruction technique. CONTRAST:  70mL OMNIPAQUE IOHEXOL 350 MG/ML SOLN COMPARISON:  CT chest angio dated March 11, 2017 FINDINGS: Cardiovascular: Adequate contrast opacification of the pulmonary arteries. 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 Additional ill-defined filling defects are seen distally in the right lower lobe segmental and subsegmental pulmonary arteries which are likely due to slow flow. Normal heart size. No pericardial effusion. Left main and three-vessel coronary artery calcifications. Atherosclerotic disease of the thoracic aorta. 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 Mediastinum/Nodes: Esophagus is unremarkable. Prominent right upper paratracheal lymph node located on image 34 is unchanged when compared with prior exam. Calcified mediastinal and hilar lymph nodes. No pathologically enlarged lymph nodes are seen in the chest. Lungs/Pleura: Central airways  are patent. Centrilobular emphysema. Right lower lobe ground-glass opacity. Linear opacities of the right lower lobe and left lingula, likely due to scarring or atelectasis. Small solid pulmonary nodules are stable. Reference nodule of the right upper lobe measuring 4 mm on series 6, image 81. Upper Abdomen: Atrophic kidneys.  No acute abnormality. Musculoskeletal: No chest wall abnormality. No acute or significant osseous findings. Review of the MIP images confirms the above findings. IMPRESSION: 1. 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 Additional ill-defined filling defects are seen distally in the right lower  lobe segmental and subsegmental pulmonary arteries which are likely due to slow flow. 2. Right lower lobe ground-glass opacity, likely due to infection or aspiration. 3. Aortic Atherosclerosis (ICD10-I70.0) and Emphysema (ICD10-J43.9). Electronically Signed   By: Allegra LaiLeah  Strickland M.D.   On: 05/17/2021 19:07   DG Chest Portable 1 View  Result Date: 05/17/2021 CLINICAL DATA:  Increased sob/a fib/copd/htn/hx MI/smoker, Dx with covid a week ago EXAM: PORTABLE CHEST 1 VIEW COMPARISON:  03/11/2017 FINDINGS: Old granulomatous disease. Atherosclerotic calcification of the aortic arch. Heart size within normal limits. Moderate and increased interstitial accentuation with some Kerley a lines. Emphysema. IMPRESSION: 1. Progressive interstitial accentuation with normal heart size. Although some of this may reflect progression of chronic interstitial lung disease, a component of superimposed atypical pneumonia is not excluded. 2. Aortic Atherosclerosis (ICD10-I70.0) and Emphysema (ICD10-J43.9). 3. Old granulomatous disease. Electronically Signed   By: Gaylyn RongWalter  Liebkemann M.D.   On: 05/17/2021 15:28   ECHOCARDIOGRAM COMPLETE  Result Date: 05/18/2021    ECHOCARDIOGRAM REPORT   Patient Name:   Calvin Carroll Date of Exam:  05/18/2021 Medical Rec #:  [id number]        Height:       68.0 in Accession #:    [id number]       Weight:       112.0 lb Date of Birth:  07/28/1955       BSA:          1.597 m Patient Age:    65 years         BP:           129/87 mmHg Patient Gender: M                HR:           72 bpm. [facility] Procedure: 2D Echo, Cardiac Doppler and Color Doppler Indications:    Atrial Fibrillation  History:        Patient has no prior history of Echocardiogram examinations.                 COPD, Arrythmias:Atrial Fibrillation; Risk Factors:Hypertension.  Sonographer:    Mikki Harbororothy Buchanan Referring Phys: [id number] Heloise BeechamEJIROGHENE E [facility]  Sonographer Comments: Technically challenging study due to limited acoustic windows, suboptimal parasternal window and no apical window. COVID + IMPRESSIONS  1. Limited study due to poor acoustic windows. No apical views.  2. Left ventricular ejection fraction, by estimation, is 55 to 60%. The left ventricle has normal function. Left ventricular endocardial border not optimally defined to evaluate regional wall motion. There is mild concentric left ventricular hypertrophy. Left ventricular diastolic parameters are consistent with Grade I diastolic dysfunction (impaired relaxation).  3. Right ventricular systolic function is normal. The right ventricular size is not well visualized.  4. The mitral valve is degenerative. Trivial mitral valve regurgitation. Moderate to severe mitral annular calcification.  5. The aortic valve was not well visualized and incompletely interrogated due to poor echo windows. Based on limited views, there is moderate calcification of the aortic valve. There is moderate thickening of the aortic valve. Aortic valve regurgitation  is likely mild. Suspect mild aortic stenosis, however, suboptimal doppler interrogation.  6. Aortic dilatation noted. There is mild dilatation of the aortic root, measuring 38 mm.  7. The inferior vena cava is normal in size with  greater than 50% respiratory variability, suggesting right atrial pressure of 3 mmHg. FINDINGS  Left Ventricle: Left ventricular ejection fraction, by estimation, is 55  to 60%. The left ventricle has normal function. Left ventricular endocardial border not optimally defined to evaluate regional wall motion. The left ventricular internal cavity size was normal in size. There is mild concentric left ventricular hypertrophy. Left ventricular diastolic parameters are consistent with Grade I diastolic dysfunction (impaired relaxation). Right Ventricle: The right ventricular size is not well visualized. Right vetricular wall thickness was not well visualized. Right ventricular systolic function is normal. Left Atrium: Left atrial size was not well visualized. Right Atrium: Right atrial size was not well visualized. Pericardium: There is no evidence of pericardial effusion. Mitral Valve: The mitral valve is degenerative in appearance. There is mild thickening of the mitral valve leaflet(s). There is moderate calcification of the mitral valve leaflet(s). Moderate to severe mitral annular calcification. Trivial mitral valve regurgitation. MV peak gradient, 3.2 mmHg. The mean mitral valve gradient is 1.0 mmHg. Tricuspid Valve: The tricuspid valve is normal in structure. Tricuspid valve regurgitation is trivial. Aortic Valve: The aortic valve was not well visualized. There is moderate calcification of the aortic valve. There is moderate thickening of the aortic valve. Aortic valve regurgitation is mild. Aortic valve mean gradient measures 7.0 mmHg. Aortic valve peak gradient measures 12.4 mmHg. Aortic valve area, by VTI measures 1.61 cm. Pulmonic Valve: The pulmonic valve was normal in structure. Pulmonic valve regurgitation is trivial. Aorta: Aortic dilatation noted. There is mild dilatation of the aortic root, measuring 38 mm. Venous: The inferior vena cava is normal in size with greater than 50% respiratory variability,  suggesting right atrial pressure of 3 mmHg. IAS/Shunts: The atrial septum is grossly normal.  LEFT VENTRICLE PLAX 2D LVIDd:         4.00 cm LVIDs:         2.90 cm LV PW:         1.00 cm LV IVS:        1.30 cm LVOT diam:     2.00 cm LV SV:         65 LV SV Index:   41 LVOT Area:     3.14 cm  LEFT ATRIUM         Index LA diam:    4.50 cm 2.82 cm/m  AORTIC VALVE                     PULMONIC VALVE AV Area (Vmax):    1.77 cm      PV Vmax:       1.05 m/s AV Area (Vmean):   1.36 cm      PV Peak grad:  4.4 mmHg AV Area (VTI):     1.61 cm AV Vmax:           176.00 cm/s AV Vmean:          124.000 cm/s AV VTI:            0.406 m AV Peak Grad:      12.4 mmHg AV Mean Grad:      7.0 mmHg LVOT Vmax:         99.20 cm/s LVOT Vmean:        53.500 cm/s LVOT VTI:          0.208 m LVOT/AV VTI ratio: 0.51  AORTA Ao Root diam: 3.80 cm MITRAL VALVE MV Area (PHT): 2.09 cm    SHUNTS MV Area VTI:   2.97 cm    Systemic VTI:  0.21 m MV Peak grad:  3.2 mmHg    Systemic Diam:  2.00 cm MV Mean grad:  1.0 mmHg MV Vmax:       0.90 m/s MV Vmean:      47.6 cm/s MV Decel Time: 363 msec MV E velocity: 58.50 cm/s MV A velocity: 88.10 cm/s MV E/A ratio:  0.66 Gwyndolyn Kaufman MD Electronically signed by Gwyndolyn Kaufman MD Signature Date/Time: 05/18/2021/11:40:04 AM    Final      Subjective: Pt reports he feels great and he really wants to go home, he denies chest pain and SOB, and denies palpitations.  Discharge Exam: Vitals:   05/18/21 1100 05/18/21 1204  BP: 122/70   Pulse: 71   Resp: 19   Temp:  98.9 F (37.2 C)  SpO2: 95%    Vitals:   05/18/21 0900 05/18/21 1000 05/18/21 1100 05/18/21 1204  BP: (!) 149/105 137/77 122/70   Pulse: 74 71 71   Resp: (!) 23 20 19    Temp:    98.9 F (37.2 C)  TempSrc:    Oral  SpO2: 95% 92% 95%   Weight:      Height:       General: Pt is alert, awake, not in acute distress Cardiovascular: normal S1/S2 +, no rubs, no gallops Respiratory: CTA bilaterally, no wheezing, no  rhonchi Abdominal: Soft, NT, ND, bowel sounds + Extremities: no edema, no cyanosis   The results of significant diagnostics from this hospitalization (including imaging, microbiology, ancillary and laboratory) are listed below for reference.     Microbiology: Recent Results (from the past 240 hour(s))  Resp Panel by RT-PCR (Flu A&B, Covid) Nasopharyngeal Swab     Status: Abnormal   Collection Time: 05/17/21  3:47 PM   Specimen: Nasopharyngeal Swab; Nasopharyngeal(NP) swabs in vial transport medium  Result Value Ref Range Status   SARS Coronavirus 2 by RT PCR POSITIVE (A) NEGATIVE Final    Comment: (NOTE) SARS-CoV-2 target nucleic acids are DETECTED.  The SARS-CoV-2 RNA is generally detectable in upper respiratory specimens during the acute phase of infection. Positive results are indicative of the presence of the identified virus, but do not rule out bacterial infection or co-infection with other pathogens not detected by the test. Clinical correlation with patient history and other diagnostic information is necessary to determine patient infection status. The expected result is Negative.  Fact Sheet for Patients: EntrepreneurPulse.com.au  Fact Sheet for Healthcare Providers: IncredibleEmployment.be  This test is not yet approved or cleared by the [place] FDA and  has been authorized for detection and/or diagnosis of SARS-CoV-2 by FDA under an Emergency Use Authorization (EUA).  This EUA will remain in effect (meaning this test can be used) for the duration of  the COVID-19 declaration under Section 564(b)(1) of the A ct, 21 U.S.C. section 360bbb-3(b)(1), unless the authorization is terminated or revoked sooner.     Influenza A by PCR NEGATIVE NEGATIVE Final   Influenza B by PCR NEGATIVE NEGATIVE Final    Comment: (NOTE) The Xpert Xpress SARS-CoV-2/FLU/RSV plus assay is intended as an aid in the diagnosis of influenza from  Nasopharyngeal swab specimens and should not be used as a sole basis for treatment. Nasal washings and aspirates are unacceptable for Xpert Xpress SARS-CoV-2/FLU/RSV testing.  Fact Sheet for Patients: EntrepreneurPulse.com.au  Fact Sheet for Healthcare Providers: IncredibleEmployment.be  This test is not yet approved or cleared by the [place] FDA and has been authorized for detection and/or diagnosis of SARS-CoV-2 by FDA under an Emergency Use Authorization (EUA). This EUA will remain in effect (meaning this test  can be used) for the duration of the COVID-19 declaration under Section 564(b)(1) of the Act, 21 U.S.C. section 360bbb-3(b)(1), unless the authorization is terminated or revoked.  Performed at [facility], [address] [place], [place] [address]   MRSA Next Gen by PCR, Nasal     Status: None   Collection Time: 05/18/21 12:24 PM   Specimen: Nasal Mucosa; Nasal Swab  Result Value Ref Range Status   MRSA by PCR Next Gen NOT DETECTED NOT DETECTED Final    Comment: (NOTE) The GeneXpert MRSA Assay (FDA approved for NASAL specimens only), is one component of a comprehensive MRSA colonization surveillance program. It is not intended to diagnose MRSA infection nor to guide or monitor treatment for MRSA infections. Test performance is not FDA approved in patients less than 14 years old. Performed at [facility], [address] [place], [place] [address]      Labs: BNP (last 3 results) Recent Labs    05/17/21 1455  BNP [id number]*   Basic Metabolic Panel: Recent Labs  Lab 05/17/21 1455 05/18/21 0356  NA 131* 133*  K 4.0 4.0  CL 98 101  CO2 24 26  GLUCOSE 153* 81  BUN 13 20  CREATININE 0.95 0.97  CALCIUM 8.4* 8.6*  MG 1.7  --    Liver Function Tests: No results for input(s): AST, ALT, ALKPHOS, BILITOT, PROT, ALBUMIN in the last 168 hours. No results for input(s): LIPASE, AMYLASE in the last 168 hours. No results for  input(s): AMMONIA in the last 168 hours. CBC: Recent Labs  Lab 05/17/21 1455 05/18/21 0356  WBC 7.3 5.9  HGB 16.5 15.3  HCT 49.6 46.6  MCV 93.6 94.9  PLT 161 150   Cardiac Enzymes: No results for input(s): CKTOTAL, CKMB, CKMBINDEX, TROPONINI in the last 168 hours. BNP: Invalid input(s): POCBNP CBG: No results for input(s): GLUCAP in the last 168 hours. D-Dimer Recent Labs    05/17/21 1455  DDIMER 2.12*   Hgb A1c No results for input(s): HGBA1C in the last 72 hours. Lipid Profile No results for input(s): CHOL, HDL, LDLCALC, TRIG, CHOLHDL, LDLDIRECT in the last 72 hours. Thyroid function studies Recent Labs    05/17/21 1455  TSH 2.261   Anemia work up No results for input(s): VITAMINB12, FOLATE, FERRITIN, TIBC, IRON, RETICCTPCT in the last 72 hours. Urinalysis    Component Value Date/Time   COLORURINE AMBER (A) 12/03/2016 1026   APPEARANCEUR CLEAR 12/03/2016 1026   LABSPEC 1.023 12/03/2016 1026   PHURINE 5.0 12/03/2016 1026   GLUCOSEU NEGATIVE 12/03/2016 1026   HGBUR SMALL (A) 12/03/2016 1026   BILIRUBINUR NEGATIVE 12/03/2016 1026   KETONESUR 5 (A) 12/03/2016 1026   PROTEINUR >=300 (A) 12/03/2016 1026   NITRITE NEGATIVE 12/03/2016 1026   LEUKOCYTESUR NEGATIVE 12/03/2016 1026   Sepsis Labs Invalid input(s): PROCALCITONIN,  WBC,  LACTICIDVEN Microbiology Recent Results (from the past 240 hour(s))  Resp Panel by RT-PCR (Flu A&B, Covid) Nasopharyngeal Swab     Status: Abnormal   Collection Time: 05/17/21  3:47 PM   Specimen: Nasopharyngeal Swab; Nasopharyngeal(NP) swabs in vial transport medium  Result Value Ref Range Status   SARS Coronavirus 2 by RT PCR POSITIVE (A) NEGATIVE Final    Comment: (NOTE) SARS-CoV-2 target nucleic acids are DETECTED.  The SARS-CoV-2 RNA is generally detectable in upper respiratory specimens during the acute phase of infection. Positive results are indicative of the presence of the identified virus, but do not rule out bacterial  infection or co-infection with other  pathogens not detected by the test. Clinical correlation with patient history and other diagnostic information is necessary to determine patient infection status. The expected result is Negative.  Fact Sheet for Patients: EntrepreneurPulse.com.au  Fact Sheet for Healthcare Providers: IncredibleEmployment.be  This test is not yet approved or cleared by the [place] FDA and  has been authorized for detection and/or diagnosis of SARS-CoV-2 by FDA under an Emergency Use Authorization (EUA).  This EUA will remain in effect (meaning this test can be used) for the duration of  the COVID-19 declaration under Section 564(b)(1) of the A ct, 21 U.S.C. section 360bbb-3(b)(1), unless the authorization is terminated or revoked sooner.     Influenza A by PCR NEGATIVE NEGATIVE Final   Influenza B by PCR NEGATIVE NEGATIVE Final    Comment: (NOTE) The Xpert Xpress SARS-CoV-2/FLU/RSV plus assay is intended as an aid in the diagnosis of influenza from Nasopharyngeal swab specimens and should not be used as a sole basis for treatment. Nasal washings and aspirates are unacceptable for Xpert Xpress SARS-CoV-2/FLU/RSV testing.  Fact Sheet for Patients: EntrepreneurPulse.com.au  Fact Sheet for Healthcare Providers: IncredibleEmployment.be  This test is not yet approved or cleared by the [place] FDA and has been authorized for detection and/or diagnosis of SARS-CoV-2 by FDA under an Emergency Use Authorization (EUA). This EUA will remain in effect (meaning this test can be used) for the duration of the COVID-19 declaration under Section 564(b)(1) of the Act, 21 U.S.C. section 360bbb-3(b)(1), unless the authorization is terminated or revoked.  Performed at [facility], [address] [place], [place] [address]   MRSA Next Gen by PCR, Nasal     Status: None   Collection Time:  05/18/21 12:24 PM   Specimen: Nasal Mucosa; Nasal Swab  Result Value Ref Range Status   MRSA by PCR Next Gen NOT DETECTED NOT DETECTED Final    Comment: (NOTE) The GeneXpert MRSA Assay (FDA approved for NASAL specimens only), is one component of a comprehensive MRSA colonization surveillance program. It is not intended to diagnose MRSA infection nor to guide or monitor treatment for MRSA infections. Test performance is not FDA approved in patients less than 69 years old. Performed at [facility], [address], [place], [place] [address]    Time coordinating discharge:   SIGNED:  Irwin Brakeman, MD  [facility] 05/18/2021, 2:56 PM How to contact the [facility] Attending or Consulting provider [place] or covering provider during after hours [place], for this patient?  Check the care team in [facility] and look for a) attending/consulting TRH provider listed and b) the [facility] team listed Log into [email or web address] and use [place]'s universal password to access. If you do not have the password, please contact the hospital operator. Locate the [facility] provider you are looking for under [facility] and page to a number that you can be directly reached. If you still have difficulty reaching the provider, please page the [facility] (Director on Call) for the Hospitalists listed on amion for assistance.

## 2021-05-18 NOTE — Progress Notes (Signed)
PROGRESS NOTE   Calvin Carroll  [id number] DOB: 1956-04-01 DOA: 05/17/2021 PCP: [facility]   Chief Complaint  Patient presents with   Atrial Fibrillation   Level of care: Telemetry  Brief Admission History:   66 y.o. male with medical history significant for COPD, hypertension, renal transplant.  Patient presented to the ED with complaints of chest pressure, difficulty breathing and feeling his heart racing today.  Reports he checks his blood pressure, it was 98/68, and his heart rate was 158.  He describes chest pressure to the left side of his chest, nonradiating. No lower extremity swelling.  He has been using his albuterol multiple times a day over the past 3 days.  He tested positive for COVID 1/17 a week ago, he has been having symptoms of nasal congestion for about 3 days, with fatigue.  He otherwise was mostly asymptomatic from [place]. He was prescribed steroids and Levaquin.  He completed 4 doses of COVID-vaccine.  He is not on home O2.    Assessment & Plan:   Principal Problem:   Atrial fibrillation with rapid ventricular response (HCC) Active Problems:   Renal transplant recipient   HTN (hypertension)   COPD (chronic obstructive pulmonary disease) ([place])   COVID-19 virus infection   Atrial fibrillation with RVR - fortunately he responded favorably to 10 mg of cardizem  - it appears he has been having intermittent episodes of palpitations and dyspnea over past month - cardiology consulted and recommended starting apixaban 5 mg BID for CHAD2VASc of 2  - TTE pending - cardiology team planning ambulatory heart monitor at discharge   Elevated troponin - likely was demand ischemia in setting of new atrial fib with RVR - TTE pending - cardiology to decide about stress testing inpatient versus outpatient   Essential hypertension  - soft BPs much improved now after fluid bolus - he is restarted on home lisinopril 40 mg daily - cardiology team changed  labetalol to metoprolol XL 25 mg every evening  Covid Infection  - diagnosed 05/11/21 - he is fully vaccinated and boosted - he is completing a prednisone burst and completed course of levofloxacin  - he remains on isolation as he first tested positive 8 days ago.   COPD  - albuterol ordered as needed  S/p renal transplant - he remains on home tacrolimus, mycophenolate - 1/25 he will resume daily prednisone 5 mg daily   DVT prophylaxis: enoxaparin  Code Status: Full  Family Communication:  Disposition: home  Status is: Inpatient  Remains inpatient appropriate because: inpatient cardiac work up in progress, significant medication changes, inpatient subspecialty evaluation   Consultants:  Cardiology   Procedures:    Antimicrobials:     Subjective: Pt reports that he is feeling better, no more chest pain or pressure and no SOB, nonproductive cough.   Objective: Vitals:   05/18/21 0000 05/18/21 0100 05/18/21 0200 05/18/21 0400  BP: 132/69 (!) 147/78 129/87   Pulse: 62 (!) 56 (!) 57   Resp: 18 16 20    Temp: 98 F (36.7 C)   98.9 F (37.2 C)  TempSrc: Oral   Oral  SpO2: 92% 94% 94%   Weight:      Height:        Intake/Output Summary (Last 24 hours) at 05/18/2021 1032 Last data filed at 05/17/2021 2000 Gross per 24 hour  Intake 200 ml  Output --  Net 200 ml   Filed Weights   05/17/21 1432  Weight: 50.8 kg  Examination:  General exam: awake, comfortable, no distress, Appears calm  Respiratory system: Clear to auscultation. No increased work of breathing.  Cardiovascular system: irregularly irregular, normal S1 & S2 heard. No JVD, murmurs, rubs, gallops or clicks. No pedal edema. Gastrointestinal system: Abdomen is nondistended, soft and nontender. No organomegaly or masses felt. Normal bowel sounds heard. Central nervous system: Alert and oriented. No focal neurological deficits. Extremities: Symmetric 5 x 5 power. Skin: No rashes, lesions or  ulcers Psychiatry: Judgement and insight appear normal. Mood & affect appropriate.   Data Reviewed: I have personally reviewed following labs and imaging studies  CBC: Recent Labs  Lab 05/17/21 1455 05/18/21 0356  WBC 7.3 5.9  HGB 16.5 15.3  HCT 49.6 46.6  MCV 93.6 94.9  PLT 161 [id number]    Basic Metabolic Panel: Recent Labs  Lab 05/17/21 1455 05/18/21 0356  NA 131* 133*  K 4.0 4.0  CL 98 101  CO2 24 26  GLUCOSE 153* 81  BUN 13 20  CREATININE 0.95 0.97  CALCIUM 8.4* 8.6*  MG 1.7  --     GFR: Estimated Creatinine Clearance: 55.3 mL/min (by C-G formula based on SCr of 0.97 mg/dL).  Liver Function Tests: No results for input(s): AST, ALT, ALKPHOS, BILITOT, PROT, ALBUMIN in the last 168 hours.  CBG: No results for input(s): GLUCAP in the last 168 hours.  Recent Results (from the past 240 hour(s))  Resp Panel by RT-PCR (Flu A&B, Covid) Nasopharyngeal Swab     Status: Abnormal   Collection Time: 05/17/21  3:47 PM   Specimen: Nasopharyngeal Swab; Nasopharyngeal(NP) swabs in vial transport medium  Result Value Ref Range Status   SARS Coronavirus 2 by RT PCR POSITIVE (A) NEGATIVE Final    Comment: (NOTE) SARS-CoV-2 target nucleic acids are DETECTED.  The SARS-CoV-2 RNA is generally detectable in upper respiratory specimens during the acute phase of infection. Positive results are indicative of the presence of the identified virus, but do not rule out bacterial infection or co-infection with other pathogens not detected by the test. Clinical correlation with patient history and other diagnostic information is necessary to determine patient infection status. The expected result is Negative.  Fact Sheet for Patients: EntrepreneurPulse.com.au  Fact Sheet for Healthcare Providers: IncredibleEmployment.be  This test is not yet approved or cleared by the [place] FDA and  has been authorized for detection and/or diagnosis of  SARS-CoV-2 by FDA under an Emergency Use Authorization (EUA).  This EUA will remain in effect (meaning this test can be used) for the duration of  the COVID-19 declaration under Section 564(b)(1) of the A ct, 21 U.S.C. section 360bbb-3(b)(1), unless the authorization is terminated or revoked sooner.     Influenza A by PCR NEGATIVE NEGATIVE Final   Influenza B by PCR NEGATIVE NEGATIVE Final    Comment: (NOTE) The Xpert Xpress SARS-CoV-2/FLU/RSV plus assay is intended as an aid in the diagnosis of influenza from Nasopharyngeal swab specimens and should not be used as a sole basis for treatment. Nasal washings and aspirates are unacceptable for Xpert Xpress SARS-CoV-2/FLU/RSV testing.  Fact Sheet for Patients: EntrepreneurPulse.com.au  Fact Sheet for Healthcare Providers: IncredibleEmployment.be  This test is not yet approved or cleared by the [place] FDA and has been authorized for detection and/or diagnosis of SARS-CoV-2 by FDA under an Emergency Use Authorization (EUA). This EUA will remain in effect (meaning this test can be used) for the duration of the COVID-19 declaration under Section 564(b)(1) of the Act, 21 U.S.C. section  360bbb-3(b)(1), unless the authorization is terminated or revoked.  Performed at [facility], [address] [place], [place] [address]      Radiology Studies: CT Angio Chest PE W and/or Wo Contrast  Result Date: 05/17/2021 CLINICAL DATA:  Concern for pulmonary embolus EXAM: CT ANGIOGRAPHY CHEST WITH CONTRAST TECHNIQUE: Multidetector CT imaging of the chest was performed using the standard protocol during bolus administration of intravenous contrast. Multiplanar CT image reconstructions and MIPs were obtained to evaluate the vascular anatomy. RADIATION DOSE REDUCTION: This exam was performed according to the departmental dose-optimization program which includes automated exposure control, adjustment of the mA  and/or kV according to patient size and/or use of iterative reconstruction technique. CONTRAST:  85mL OMNIPAQUE IOHEXOL 350 MG/ML SOLN COMPARISON:  CT chest angio dated March 11, 2017 FINDINGS: Cardiovascular: Adequate contrast opacification of the pulmonary arteries. 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 Additional ill-defined filling defects are seen distally in the right lower lobe segmental and subsegmental pulmonary arteries which are likely due to slow flow. Normal heart size. No pericardial effusion. Left main and three-vessel coronary artery calcifications. Atherosclerotic disease of the thoracic aorta. 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 Mediastinum/Nodes: Esophagus is unremarkable. Prominent right upper paratracheal lymph node located on image 34 is unchanged when compared with prior exam. Calcified mediastinal and hilar lymph nodes. No pathologically enlarged lymph nodes are seen in the chest. Lungs/Pleura: Central airways are patent. Centrilobular emphysema. Right lower lobe ground-glass opacity. Linear opacities of the right lower lobe and left lingula, likely due to scarring or atelectasis. Small solid pulmonary nodules are stable. Reference nodule of the right upper lobe measuring 4 mm on series 6, image 81. Upper Abdomen: Atrophic kidneys.  No acute abnormality. Musculoskeletal: No chest wall abnormality. No acute or significant osseous findings. Review of the MIP images confirms the above findings. IMPRESSION: 1. 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 Additional ill-defined filling defects are seen distally in the right lower lobe segmental and subsegmental pulmonary  arteries which are likely due to slow flow. 2. Right lower lobe ground-glass opacity, likely due to infection or aspiration. 3. Aortic Atherosclerosis (ICD10-I70.0) and Emphysema (ICD10-J43.9). Electronically Signed   By: Allegra Lai M.D.   On: 05/17/2021 19:07   DG Chest Portable 1 View  Result Date: 05/17/2021 CLINICAL DATA:  Increased sob/a fib/copd/htn/hx MI/smoker, Dx with covid a week ago EXAM: PORTABLE CHEST 1 VIEW COMPARISON:  03/11/2017 FINDINGS: Old granulomatous disease. Atherosclerotic calcification of the aortic arch. Heart size within normal limits. Moderate and increased interstitial accentuation with some Kerley a lines. Emphysema. IMPRESSION: 1. Progressive interstitial accentuation with normal heart size. Although some of this may reflect progression of chronic interstitial lung disease, a component of superimposed atypical pneumonia is not excluded. 2. Aortic Atherosclerosis (ICD10-I70.0) and Emphysema (ICD10-J43.9). 3. Old granulomatous disease. Electronically Signed   By: Gaylyn Rong M.D.   On: 05/17/2021 15:28    Scheduled Meds:  apixaban  5 mg Oral BID   Chlorhexidine Gluconate Cloth  6 each Topical Daily   lisinopril  40 mg Oral Daily   metoprolol succinate  25 mg Oral QHS   predniSONE  10 mg Oral Once   [START ON 05/19/2021] predniSONE  5 mg Oral Q breakfast   tacrolimus  1.5 mg Oral q AM   tacrolimus  2 mg Oral QPM   Continuous Infusions:   LOS: 1 day   Time spent: 35 mins   Reika Callanan Wynetta Emery, MD How to contact the [facility] Attending or Consulting provider [place] or covering provider during after hours [place], for this patient?  Check the care team in [facility] and look for a) attending/consulting TRH provider listed and b) the [facility] team listed Log into [email or web address] and use [place]'s universal password to access. If you do not have the password, please contact the hospital operator. Locate the [facility] provider you are looking for under [facility] and page  to a number that you can be directly reached. If you still have difficulty reaching the provider, please page the [facility] (Director on Call) for the Hospitalists listed on amion for assistance.  05/18/2021, 10:32 AM

## 2021-05-18 NOTE — Discharge Instructions (Addendum)
Please follow up with your cardiologist to arrange for outpatient stress test and cardiac monitor    IMPORTANT INFORMATION: PAY CLOSE ATTENTION   PHYSICIAN DISCHARGE INSTRUCTIONS  Follow with Primary care provider  Clinic, Lenn Sink  and other consultants as instructed by your Hospitalist Physician  SEEK MEDICAL CARE OR RETURN TO EMERGENCY ROOM IF SYMPTOMS COME BACK, WORSEN OR NEW PROBLEM DEVELOPS   Please note: You were cared for by a hospitalist during your hospital stay. Every effort will be made to forward records to your primary care provider.  You can request that your primary care provider send for your hospital records if they have not received them.  Once you are discharged, your primary care physician will handle any further medical issues. Please note that NO REFILLS for any discharge medications will be authorized once you are discharged, as it is imperative that you return to your primary care physician (or establish a relationship with a primary care physician if you do not have one) for your post hospital discharge needs so that they can reassess your need for medications and monitor your lab values.  Please get a complete blood count and chemistry panel checked by your Primary MD at your next visit, and again as instructed by your Primary MD.  Get Medicines reviewed and adjusted: Please take all your medications with you for your next visit with your Primary MD  Laboratory/radiological data: Please request your Primary MD to go over all hospital tests and procedure/radiological results at the follow up, please ask your primary care provider to get all Hospital records sent to his/her office.  In some cases, they will be blood work, cultures and biopsy results pending at the time of your discharge. Please request that your primary care provider follow up on these results.  If you are diabetic, please bring your blood sugar readings with you to your follow up appointment  with primary care.    Please call and make your follow up appointments as soon as possible.    Also Note the following: If you experience worsening of your admission symptoms, develop shortness of breath, life threatening emergency, suicidal or homicidal thoughts you must seek medical attention immediately by calling 911 or calling your MD immediately  if symptoms less severe.  You must read complete instructions/literature along with all the possible adverse reactions/side effects for all the Medicines you take and that have been prescribed to you. Take any new Medicines after you have completely understood and accpet all the possible adverse reactions/side effects.   Do not drive when taking Pain medications or sleeping medications (Benzodiazepines)  Do not take more than prescribed Pain, Sleep and Anxiety Medications. It is not advisable to combine anxiety,sleep and pain medications without talking with your primary care practitioner  Special Instructions: If you have smoked or chewed Tobacco  in the last 2 yrs please stop smoking, stop any regular Alcohol  and or any Recreational drug use.  Wear Seat belts while driving.  Do not drive if taking any narcotic, mind altering or controlled substances or recreational drugs or alcohol.

## 2021-05-18 NOTE — Consult Note (Signed)
Cardiology Consultation:   Patient ID: Calvin Carroll MRN: [id number]; DOB: [birth year]  Admit date: 05/17/2021 Date of Consult: 05/18/2021  PCP:  [facility], [facility]   [facility] Providers Cardiologist:  None   {    Patient Profile:   Calvin Carroll is a 66 y.o. male with a hx of COPD, HTN, renal transplant who is being seen 05/18/2021 for the evaluation of atrial fibrillation at the request of Dr. Mariea Clonts.  History of Present Illness:   Mr. 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  Patient states that over the past month he has been having intermittent episodes where he would suddenly feel SOB and mild palpitations. The symptoms would last several minutes before resolving. This happened several times but acutely worsened after he was diagnosed with COVID. On the day of admission he developed persistent palpitations, SOB and chest discomfort prompting him to go the ER.   In the ER, HR 132 in Afib with RVR. BP soft in 90/60s.  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 He was given cardizem and 500cc of fluid with improvement.  Currently, the patient has converted to NSR. He feels much better. He is notably followed by [facility] and states he has had a recent negative lexiscan in the past 1-2 years. NO known history of obstructive CAD. Had episode following his renal transplant where he had chest pain and elevated troponin which was deemed demand ischemia at that time. No cath history or prior PCIs.    Past Medical  History:  Diagnosis Date   COPD (chronic obstructive pulmonary disease) (HCC)    Hypertension    MI (myocardial infarction) (HCC)    Renal disorder     Past Surgical History:  Procedure Laterality Date   KIDNEY TRANSPLANT     [facility]   KIDNEY TRANSPLANT  2011   Pt has had 2 transplants   NEPHRECTOMY TRANSPLANTED ORGAN       Home Medications:  Prior to Admission medications   Medication Sig Start Date End Date Taking? Authorizing Provider  albuterol (PROVENTIL HFA;VENTOLIN HFA) 108 (90 Base) MCG/ACT inhaler Inhale 1-2 puffs into the lungs every 6 (six) hours as needed for wheezing or shortness of breath.   Yes [provider]  aspirin 325 MG tablet Take 325 mg by mouth daily.   Yes [provider]  Cholecalciferol (VITAMIN D-3) 1000 units CAPS Take 1 capsule by mouth daily.   Yes [provider]  flunisolide (NASALIDE) 25 MCG/ACT (0.025%) SOLN Place 2 sprays into the nose daily as needed (nasal congestion).   Yes [provider]  labetalol (NORMODYNE) 100 MG tablet Take 100 mg by mouth every evening.   Yes [provider]  levofloxacin (LEVAQUIN) 500 MG tablet Take 500 mg by mouth daily. 05/11/21  Yes [provider]  lisinopril (ZESTRIL) 20 MG tablet Take 40 mg by mouth every evening.   Yes [provider]  omeprazole (PRILOSEC) 20 MG capsule Take by mouth. 03/23/10  Yes [provider]  predniSONE (DELTASONE) 10 MG tablet Take 10 mg by mouth  See admin instructions. Take 4 tabs x 2 days, 3 tabs x 2 days, 2 tabs x 2 days, then take 1 tablet every day for 2 days then stop. 05/11/21  Yes [provider]  tacrolimus (PROGRAF) 0.5 MG capsule Take 0.5 mg by mouth daily. Pt takes with 1 mg capsule in the morning.   Yes [provider]  tacrolimus (PROGRAF) 1 MG capsule Take 1 mg by mouth 2 (two) times daily. Take 1 mg (along with 0.5 mg capsule) in the morning and 2 capsules every evening   Yes [provider]  tiotropium (SPIRIVA) 18 MCG inhalation capsule Place 18 mcg into inhaler and inhale daily.   Yes [provider]  predniSONE (DELTASONE) 5 MG tablet Take 5 mg by mouth daily with breakfast.    [provider]  sucralfate (CARAFATE) 1 g tablet Take 1 tablet (1 g total) 4 (four) times daily -  with meals and at bedtime by mouth. Patient not taking: Reported on 05/17/2021 03/11/17   Noemi Chapel, MD    Inpatient Medications: Scheduled Meds:  aspirin  325 mg Oral Daily   Chlorhexidine Gluconate Cloth  6 each Topical Daily   enoxaparin (LOVENOX) injection  40 mg Subcutaneous Q24H   labetalol  100 mg Oral QPM   predniSONE  10 mg Oral Once   [START ON 05/19/2021] predniSONE  5 mg Oral Q breakfast   tacrolimus  1.5 mg Oral q AM   tacrolimus  2 mg Oral QPM   Continuous Infusions:  PRN Meds: acetaminophen **OR** acetaminophen, albuterol, metoprolol tartrate, polyethylene glycol, promethazine  Allergies:   No Known Allergies  Social History:   Social History   Socioeconomic History   Marital status: Divorced    Spouse name: Not on file   Number of children: Not on file   Years of education: Not on file   Highest education level: Not on file  Occupational History   Not on file  Tobacco Use   Smoking status: Every Day    Packs/day: 1.50    Types: Cigarettes   Smokeless tobacco: Never  Substance and Sexual Activity   Alcohol use: Yes    Comment: couple of beers in the afternoon 3-4 days a week   Drug use: No   Sexual activity: Not on file  Other Topics Concern   Not on file  Social History Narrative   Not on file   Social Determinants of Health   Financial Resource Strain: Not on file  Food Insecurity: Not on file  Transportation Needs: Not on file  Physical Activity: Not on file  Stress: Not on file  Social Connections: Not on file  Intimate Partner Violence: Not on file    Family History:   No family history on file.   ROS:  Please  see the history of present illness.  Review of Systems  Constitutional:  Positive for malaise/fatigue. Negative for fever.  Respiratory:  Positive for cough and shortness of breath.   Cardiovascular:  Positive for chest pain and palpitations.  Gastrointestinal:  Negative for nausea and vomiting.  Musculoskeletal:  Negative for falls.  Neurological:  Negative for dizziness and loss of consciousness.   All other ROS reviewed and negative.     Physical Exam/Data:   Vitals:   05/18/21 0000 05/18/21 0100 05/18/21 0200 05/18/21 0400  BP: 132/69 (!) 147/78 129/87   Pulse: 62 (!) 56 (!) 57   Resp: 18 16 20    Temp: 98 F (36.7 C)  98.9 F (37.2 C)  TempSrc: Oral   Oral  SpO2: 92% 94% 94%   Weight:      Height:        Intake/Output Summary (Last 24 hours) at 05/18/2021 0903 Last data filed at 05/17/2021 2000 Gross per 24 hour  Intake 200 ml  Output --  Net 200 ml   Last 3 Weights 05/17/2021 03/11/2017 12/03/2016  Weight (lbs) 112 lb 118 lb 111 lb  Weight (kg) 50.803 kg 53.524 kg 50.349 kg     Body mass index is 17.03 kg/m.   General:  Well nourished, well developed, in no acute distress HEENT: normal Neck: no JVD Vascular: No carotid bruits; Distal pulses 2+ bilaterally Cardiac:  normal S1, S2; RRR; no murmur  Lungs:  clear to auscultation bilaterally, no wheezing, rhonchi or rales  Abd: soft, nontender, no hepatomegaly  Ext: no edema Musculoskeletal:  No deformities, BUE and BLE strength normal and equal Skin: warm and dry  Neuro:  CNs 2-12 intact, no focal abnormalities noted Psych:  Normal affect   EKG:  The EKG was personally reviewed and demonstrates:  Afib with HR 98 Telemetry:  Telemetry was personally reviewed and demonstrates:  NSR with one 10 beat run of RVR  Relevant CV Studies: CTA PE protocol 05/17/20: FINDINGS: Cardiovascular: Adequate contrast opacification of the pulmonary arteries. 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 Additional ill-defined filling defects are seen distally in the right lower lobe segmental and subsegmental pulmonary arteries which are likely due to slow flow. Normal heart size. No pericardial effusion. Left main and three-vessel coronary artery calcifications. Atherosclerotic disease of the thoracic aorta. 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   Mediastinum/Nodes: Esophagus is unremarkable. Prominent right upper paratracheal lymph node located on image 34 is unchanged when compared with prior exam. Calcified mediastinal and hilar lymph nodes. No pathologically enlarged lymph nodes are seen in the chest.   Lungs/Pleura: Central airways are patent. Centrilobular emphysema. Right lower lobe ground-glass opacity. Linear opacities of the right lower lobe and left lingula, likely due to scarring or atelectasis. Small solid pulmonary nodules are stable. Reference nodule of the right upper lobe measuring 4 mm on series 6, image 81.   Upper Abdomen: Atrophic kidneys.  No acute abnormality.   Musculoskeletal: No chest wall abnormality. No acute or significant osseous findings.   Review of the MIP images confirms the above findings.   IMPRESSION: 1. 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 Additional ill-defined filling defects are seen distally in the right lower lobe segmental and subsegmental pulmonary arteries which are likely due to slow flow. 2. Right lower lobe ground-glass opacity, likely due to infection or aspiration. 3. Aortic Atherosclerosis (ICD10-I70.0) and Emphysema (ICD10-J43.9).  Laboratory Data:  High Sensitivity Troponin:   Recent Labs  Lab 05/17/21 1455 05/17/21 1648  05/17/21 2134  TROPONINIHS 115* 135* 147*     Chemistry Recent Labs  Lab 05/17/21 1455 05/18/21 0356  NA 131* 133*  K 4.0 4.0  CL 98 101  CO2 24 26  GLUCOSE 153* 81  BUN 13 20  CREATININE 0.95 0.97  CALCIUM 8.4* 8.6*  MG 1.7  --   GFRNONAA >60 >60  ANIONGAP 9 6    No results for input(s): PROT, ALBUMIN,  AST, ALT, ALKPHOS, BILITOT in the last 168 hours. Lipids No results for input(s): CHOL, TRIG, HDL, LABVLDL, LDLCALC, CHOLHDL in the last 168 hours.  Hematology Recent Labs  Lab 05/17/21 1455 05/18/21 0356  WBC 7.3 5.9  RBC 5.30 4.91  HGB 16.5 15.3  HCT 49.6 46.6  MCV 93.6 94.9  MCH 31.1 31.2  MCHC 33.3 32.8  RDW 13.4 13.6  PLT 161 150   Thyroid  Recent Labs  Lab 05/17/21 1455  TSH 2.261    BNP Recent Labs  Lab 05/17/21 1455  BNP 573.0*    DDimer  Recent Labs  Lab 05/17/21 1455  DDIMER 2.12*     Radiology/Studies:  CT Angio Chest PE W and/or Wo Contrast  Result Date: 05/17/2021 CLINICAL DATA:  Concern for pulmonary embolus EXAM: CT ANGIOGRAPHY CHEST WITH CONTRAST TECHNIQUE: Multidetector CT imaging of the chest was performed using the standard protocol during bolus administration of intravenous contrast. Multiplanar CT image reconstructions and MIPs were obtained to evaluate the vascular anatomy. RADIATION DOSE REDUCTION: This exam was performed according to the departmental dose-optimization program which includes automated exposure control, adjustment of the mA and/or kV according to patient size and/or use of iterative reconstruction technique. CONTRAST:  6mL OMNIPAQUE IOHEXOL 350 MG/ML SOLN COMPARISON:  CT chest angio dated March 11, 2017 FINDINGS: Cardiovascular: Adequate contrast opacification of the pulmonary arteries. 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 Additional ill-defined filling defects are seen distally in the right lower lobe segmental and  subsegmental pulmonary arteries which are likely due to slow flow. Normal heart size. No pericardial effusion. Left main and three-vessel coronary artery calcifications. Atherosclerotic disease of the thoracic aorta. 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 Mediastinum/Nodes: Esophagus is unremarkable. Prominent right upper paratracheal lymph node located on image 34 is unchanged when compared with prior exam. Calcified mediastinal and hilar lymph nodes. No pathologically enlarged lymph nodes are seen in the chest. Lungs/Pleura: Central airways are patent. Centrilobular emphysema. Right lower lobe ground-glass opacity. Linear opacities of the right lower lobe and left lingula, likely due to scarring or atelectasis. Small solid pulmonary nodules are stable. Reference nodule of the right upper lobe measuring 4 mm on series 6, image 81. Upper Abdomen: Atrophic kidneys.  No acute abnormality. Musculoskeletal: No chest wall abnormality. No acute or significant osseous findings. Review of the MIP images confirms the above findings. IMPRESSION: 1. 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 Additional ill-defined filling defects are seen distally in the right lower lobe segmental and subsegmental pulmonary arteries which are likely due to slow flow. 2. Right lower lobe ground-glass opacity, likely due to infection or aspiration. 3. Aortic Atherosclerosis (ICD10-I70.0) and Emphysema (ICD10-J43.9). Electronically Signed   By: Yetta Glassman M.D.   On: 05/17/2021 19:07   DG Chest Portable 1 View  Result Date: 05/17/2021 CLINICAL DATA:  Increased sob/a fib/copd/htn/hx MI/smoker, Dx with covid a week ago EXAM: PORTABLE CHEST 1 VIEW COMPARISON:  03/11/2017 FINDINGS: Old granulomatous disease.  Atherosclerotic calcification of the aortic arch. Heart size within normal limits. Moderate and increased interstitial accentuation with some Kerley a lines. Emphysema. IMPRESSION: 1. Progressive interstitial accentuation with normal heart size. Although some of this may reflect progression of chronic interstitial lung disease, a component of superimposed atypical pneumonia is  not excluded. 2. Aortic Atherosclerosis (ICD10-I70.0) and Emphysema (ICD10-J43.9). 3. Old granulomatous disease. Electronically Signed   By: Van Clines M.D.   On: 05/17/2021 15:28     Assessment and Plan:   #Newly Diagnosed Afib: CHADs-vasc 2. Patient presented to the ER with chest pain, palpitations and SOB found to have new Afib with RVR. Notably, patient has been having intermittent episodes of palpitations and SOB over the past month prior to the onset of COVID. Suspect he has had underlying Afib previously and has just not been detected meriting AC at this time. Currently, he has converted back to NSR after receiving dilt in the ER -Start apixaban for [facility]; suspect he has had ongoing Afib and will need long-term AC -Will change labetalol to metoprolol 25mg  XL qHS (feels tired with BB during the day) -Follow-up TTE -Will plan for cardiac monitor on discharge  #Trop Elevation: Suspect secondary to demand in the setting of Afib with RVR. No current chest pain. Can consider stress testing as out-patient pending TTE findings. -Follow-up TTE -Likely plan for stress testing as out-patient  #HTN: Initially with soft blood pressures on admission. Now improved. -Resume home lisinopril 40mg  daily -Change labetalol to metoprolol 25mg  XL  #ESRD s/p renal transplantation: -On [place] and MMF -On prednisone taper  #Recent COVID infection: -Management per IM  #COPD: -Home inhalers  Risk Assessment/Risk Scores:          CHA2DS2-VASc Score = 2  {This indicates a 2.2% annual risk of stroke. The patient's score is  based upon: CHF History: 0 HTN History: 1 Diabetes History: 0 Stroke History: 0 Vascular Disease History: 1 Age Score: 0 Gender Score: 0        For questions or updates, please contact [place] Please consult [email or web address] for contact info under    Signed, Freada Bergeron, MD  05/18/2021 9:03 AM

## 2021-05-18 NOTE — Progress Notes (Signed)
*  PRELIMINARY RESULTS* Echocardiogram 2D Echocardiogram has been performed.  Carolyne Fiscal 05/18/2021, 10:54 AM

## 2021-05-18 NOTE — Progress Notes (Signed)
°  Transition of Care [facility]) Screening Note   Patient Details  Name: Calvin Carroll Date of Birth: [birth year]   Transition of Care [facility]) CM/SW Contact:    Annice Needy, LCSW Phone Number: 05/18/2021, 11:27 AM    Transition of Care Department [facility]) has reviewed patient and no TOC needs have been identified at this time. We will continue to monitor patient advancement through interdisciplinary progression rounds. If new patient transition needs arise, please place a TOC consult.   Ender Rorke, Juleen China, LCSW

## 2021-05-18 NOTE — Progress Notes (Signed)
ANTICOAGULATION CONSULT NOTE - Initial Consult  Pharmacy Consult for Apixaban Indication: atrial fibrillation  No Known Allergies  Patient Measurements: Height: 5\' 8"  (172.7 cm) Weight: 50.8 kg (112 lb) IBW/kg (Calculated) : 68.4  Vital Signs: Temp: 98.9 F (37.2 C) (01/24 0400) Temp Source: Oral (01/24 0400) BP: 129/87 (01/24 0200) Pulse Rate: 57 (01/24 0200)  Labs: Recent Labs    05/17/21 1455 05/17/21 1648 05/17/21 2134 05/18/21 0356  HGB 16.5  --   --  15.3  HCT 49.6  --   --  46.6  PLT 161  --   --  150  CREATININE 0.95  --   --  0.97  TROPONINIHS 115* 135* 147*  --     Estimated Creatinine Clearance: 55.3 mL/min (by C-G formula based on SCr of 0.97 mg/dL).   Medical History: Past Medical History:  Diagnosis Date   COPD (chronic obstructive pulmonary disease) ([place])    Hypertension    MI (myocardial infarction) ([place])    Renal disorder     Medications:  Medications Prior to Admission  Medication Sig Dispense Refill Last Dose   albuterol (PROVENTIL HFA;VENTOLIN HFA) 108 (90 Base) MCG/ACT inhaler Inhale 1-2 puffs into the lungs every 6 (six) hours as needed for wheezing or shortness of breath.   05/17/2021   aspirin 325 MG tablet Take 325 mg by mouth daily.   05/17/2021   Cholecalciferol (VITAMIN D-3) 1000 units CAPS Take 1 capsule by mouth daily.   Past Week   flunisolide (NASALIDE) 25 MCG/ACT (0.025%) SOLN Place 2 sprays into the nose daily as needed (nasal congestion).   Past Month   labetalol (NORMODYNE) 100 MG tablet Take 100 mg by mouth every evening.   05/16/2021 at 2100   levofloxacin (LEVAQUIN) 500 MG tablet Take 500 mg by mouth daily.   05/17/2021   lisinopril (ZESTRIL) 20 MG tablet Take 40 mg by mouth every evening.   05/16/2021   omeprazole (PRILOSEC) 20 MG capsule Take by mouth.   Past Month   predniSONE (DELTASONE) 10 MG tablet Take 10 mg by mouth See admin instructions. Take 4 tabs x 2 days, 3 tabs x 2 days, 2 tabs x 2 days, then take 1 tablet every  day for 2 days then stop.   05/17/2021   tacrolimus (PROGRAF) 0.5 MG capsule Take 0.5 mg by mouth daily. Pt takes with 1 mg capsule in the morning.      tacrolimus (PROGRAF) 1 MG capsule Take 1 mg by mouth 2 (two) times daily. Take 1 mg (along with 0.5 mg capsule) in the morning and 2 capsules every evening   05/17/2021   tiotropium (SPIRIVA) 18 MCG inhalation capsule Place 18 mcg into inhaler and inhale daily.   UNKNOWN   predniSONE (DELTASONE) 5 MG tablet Take 5 mg by mouth daily with breakfast.      sucralfate (CARAFATE) 1 g tablet Take 1 tablet (1 g total) 4 (four) times daily -  with meals and at bedtime by mouth. (Patient not taking: Reported on 05/17/2021) 90 tablet 0 Not Taking    Assessment: Patient presented to the ER with chest pain, palpitations and SOB found to have new Afib with RVR  Goal of Therapy:   Monitor platelets by anticoagulation protocol: Yes   Plan:  Eliquis 5mg  po bid Educate on eliquis Monitor for S/S of bleeding  Isac Sarna, BS Vena [place], BCPS Clinical Pharmacist Pager [phone] 05/18/2021,9:17 AM

## 2021-05-18 NOTE — Progress Notes (Signed)
Patient gave wallet to significant other Marlyn Corporal) to take home

## 2021-05-26 ENCOUNTER — Emergency Department (HOSPITAL_COMMUNITY)
Admission: EM | Admit: 2021-05-26 | Discharge: 2021-05-26 | Disposition: A | Discharge status: Home / Self Care | Payer: No Typology Code available for payment source | Attending: Emergency Medicine | Admitting: Emergency Medicine

## 2021-05-26 ENCOUNTER — Other Ambulatory Visit: Payer: Self-pay

## 2021-05-26 ENCOUNTER — Emergency Department (HOSPITAL_COMMUNITY): Payer: No Typology Code available for payment source

## 2021-05-26 ENCOUNTER — Encounter (HOSPITAL_COMMUNITY): Payer: Self-pay

## 2021-05-26 DIAGNOSIS — Z7901 Long term (current) use of anticoagulants: Secondary | ICD-10-CM | POA: Insufficient documentation

## 2021-05-26 DIAGNOSIS — R000 Tachycardia, unspecified: Secondary | ICD-10-CM | POA: Diagnosis present

## 2021-05-26 DIAGNOSIS — I4891 Unspecified atrial fibrillation: Secondary | ICD-10-CM | POA: Diagnosis not present

## 2021-05-26 DIAGNOSIS — Z20822 Contact with and (suspected) exposure to covid-19: Secondary | ICD-10-CM | POA: Insufficient documentation

## 2021-05-26 HISTORY — DX: Unspecified atrial fibrillation: I48.91

## 2021-05-26 LAB — COMPREHENSIVE METABOLIC PANEL
ALT: 20 U/L (ref 0–44)
AST: 27 U/L (ref 15–41)
Albumin: 3 g/dL — ABNORMAL LOW (ref 3.5–5.0)
Alkaline Phosphatase: 74 U/L (ref 38–126)
Anion gap: 5 (ref 5–15)
BUN: 20 mg/dL (ref 8–23)
CO2: 22 mmol/L (ref 22–32)
Calcium: 8.3 mg/dL — ABNORMAL LOW (ref 8.9–10.3)
Chloride: 106 mmol/L (ref 98–111)
Creatinine, Ser: 1.08 mg/dL (ref 0.61–1.24)
GFR, Estimated: >60 mL/min (ref >60)
Glucose, Bld: 94 mg/dL (ref 70–99)
Potassium: 4.5 mmol/L (ref 3.5–5.1)
Sodium: 133 mmol/L — ABNORMAL LOW (ref 135–145)
Total Bilirubin: 0.6 mg/dL (ref 0.3–1.2)
Total Protein: 6.5 g/dL (ref 6.5–8.1)

## 2021-05-26 LAB — CBC WITH DIFFERENTIAL/PLATELET
Abs Immature Granulocytes: 0.05 10*3/uL (ref 0.00–0.07)
Basophils Absolute: 0 10*3/uL (ref 0.0–0.1)
Basophils Relative: 0 %
Eosinophils Absolute: 0.1 10*3/uL (ref 0.0–0.5)
Eosinophils Relative: 2 %
HCT: 49.8 % (ref 39.0–52.0)
Hemoglobin: 17 g/dL (ref 13.0–17.0)
Immature Granulocytes: 1 %
Lymphocytes Relative: 22 %
Lymphs Abs: 1.4 10*3/uL (ref 0.7–4.0)
MCH: 32.1 pg (ref 26.0–34.0)
MCHC: 34.1 g/dL (ref 30.0–36.0)
MCV: 94.1 fL (ref 80.0–100.0)
Monocytes Absolute: 0.6 10*3/uL (ref 0.1–1.0)
Monocytes Relative: 10 %
Neutro Abs: 4 10*3/uL (ref 1.7–7.7)
Neutrophils Relative %: 65 %
Platelets: 180 10*3/uL (ref 150–400)
RBC: 5.29 MIL/uL (ref 4.22–5.81)
RDW: 13.5 % (ref 11.5–15.5)
WBC: 6.3 10*3/uL (ref 4.0–10.5)
nRBC: 0 % (ref 0.0–0.2)

## 2021-05-26 LAB — TROPONIN I (HIGH SENSITIVITY)
Troponin I (High Sensitivity): 42 ng/L — ABNORMAL HIGH (ref <18)
Troponin I (High Sensitivity): 47 ng/L — ABNORMAL HIGH (ref <18)

## 2021-05-26 LAB — RESP PANEL BY RT-PCR (FLU A&B, COVID) ARPGX2
Influenza A by PCR: NEGATIVE
Influenza B by PCR: NEGATIVE
SARS Coronavirus 2 by RT PCR: NEGATIVE

## 2021-05-26 MED ORDER — METOPROLOL TARTRATE 25 MG PO TABS: 50.0000 mg | ORAL_TABLET | Freq: Two times a day (BID) | ORAL | 1 refills | Status: DC

## 2021-05-26 MED ORDER — METOPROLOL TARTRATE 5 MG/5ML IV SOLN
5.0000 mg | Freq: Once | INTRAVENOUS | Status: AC
Start: 2021-05-26 — End: 2021-05-26
  Administered 2021-05-26: 5 mg via INTRAVENOUS
  Filled 2021-05-26: qty 5

## 2021-05-26 MED ORDER — METOPROLOL TARTRATE 25 MG PO TABS
25.0000 mg | ORAL_TABLET | Freq: Once | ORAL | Status: AC
Start: 2021-05-26 — End: 2021-05-26
  Administered 2021-05-26: 25 mg via ORAL
  Filled 2021-05-26: qty 1

## 2021-05-26 NOTE — ED Triage Notes (Signed)
Pt arrives from home via [facility]. Called out for chest pressure. Upon EMS arrival, pt in afib with rvr at a rate of 160. 18g R ac, 1L fluid bolus, HR 130 at arrival. [place] dx of afib last week after being sick with covid.

## 2021-05-26 NOTE — ED Provider Notes (Signed)
[facility] EMERGENCY DEPARTMENT Provider Note   CSN: [id number] Arrival date & time: 05/26/21  1447     History  Chief Complaint  Patient presents with   Tachycardia    Calvin Carroll is a 66 y.o. male.  HPI Presents for evaluation of a period of tachycardia and hypotension occurring around 11 AM.  He had not taken his evening medicine yesterday or morning medicines today, at the time of onset.  On arrival EMS found him to be in rapid atrial fibrillation with rate of 160.  He was treated with IV fluid 1 L bolus with improvement of his heart rate to 130.  Earlier today he had gone to see his chiropractor and had some treatments on his shoulders, with what sounds like a TENS unit.  He checked his heart rate and blood pressure and they were 170 bpm and 90/60.  After taking his usual medicines, his heart rate improved to 130.  He decided to come in to get checked.  He has new onset atrial fibrillation, diagnosed last week, following episode of COVID.  He denies shortness of breath.  He had some chest pressure earlier when his heart was racing but no chest discomfort at this time.  There was no loss of consciousness.    Home Medications Prior to Admission medications   Medication Sig Start Date End Date Taking? Authorizing Provider  albuterol (PROVENTIL HFA;VENTOLIN HFA) 108 (90 Base) MCG/ACT inhaler Inhale 1-2 puffs into the lungs every 6 (six) hours as needed for wheezing or shortness of breath.   Yes [provider]  apixaban (ELIQUIS) 5 MG TABS tablet Take 1 tablet (5 mg total) by mouth 2 (two) times daily. 05/18/21  Yes Johnson, Clanford L, MD  atorvastatin (LIPITOR) 40 MG tablet TAKE ONE TABLET BY MOUTH AT BEDTIME FOR CHOLESTEROL 10/29/20  Yes [provider]  fexofenadine (ALLEGRA) 180 MG tablet Take 180 mg by mouth daily as needed for allergies. 08/05/04  Yes [provider]  flunisolide (NASALIDE) 25 MCG/ACT (0.025%) SOLN Place 2 sprays into the nose daily as  needed (nasal congestion).   Yes [provider]  lisinopril (ZESTRIL) 20 MG tablet Take 40 mg by mouth every evening.   Yes [provider]  metoprolol tartrate (LOPRESSOR) 25 MG tablet Take 2 tablets (50 mg total) by mouth 2 (two) times daily. 05/26/21  Yes Daleen Bo, MD  mycophenolate (CELLCEPT) 250 MG capsule Take 250 mg by mouth at bedtime. 07/28/20  Yes [provider]  predniSONE (DELTASONE) 5 MG tablet Take 5 mg by mouth every evening.   Yes [provider]  tacrolimus (PROGRAF) 0.5 MG capsule Take 0.5 mg by mouth See admin instructions. 1 & 1/2 mg in the morning and 2 mg every evening as directed 01/07/21  Yes [provider]  tiotropium (SPIRIVA) 18 MCG inhalation capsule Place 18 mcg into inhaler and inhale daily.   Yes [provider]  tacrolimus (PROGRAF) 0.5 MG capsule Take 0.5 mg by mouth daily. Take 1/2 mg with the 1mg  dose to equal 1 & 1/2    [provider]  tacrolimus (PROGRAF) 1 MG capsule Take 1 mg by mouth 2 (two) times daily. Take 1 mg (along with 0.5 mg capsule) in the morning and 2 capsules every evening    [provider]      Allergies    Patient has no known allergies.    Review of Systems   Review of Systems  Physical Exam Updated Vital  Signs BP 129/85    Pulse (!) 113    Temp 98.2 F (36.8 C)    Resp (!) 25    Ht 5\' 8"  (1.727 m)    Wt 50 kg    SpO2 92%    BMI 16.76 kg/m  Physical Exam Vitals and nursing note reviewed.  Constitutional:      General: He is not in acute distress.    Appearance: He is well-developed. He is not ill-appearing, toxic-appearing or diaphoretic.  HENT:     Head: Normocephalic and atraumatic.     Right Ear: External ear normal.     Left Ear: External ear normal.  Eyes:     Conjunctiva/sclera: Conjunctivae normal.     Pupils: Pupils are equal, round, and reactive to light.  Neck:     Trachea: Phonation normal.  Cardiovascular:     Rate and Rhythm: Regular  rhythm. Tachycardia present.     Heart sounds: Normal heart sounds.  Pulmonary:     Effort: Pulmonary effort is normal. No respiratory distress.     Breath sounds: Normal breath sounds. No stridor.  Abdominal:     General: There is no distension.     Palpations: Abdomen is soft.     Tenderness: There is no abdominal tenderness.  Musculoskeletal:        General: Normal range of motion.     Cervical back: Normal range of motion and neck supple.  Skin:    General: Skin is warm and dry.  Neurological:     Mental Status: He is alert and oriented to person, place, and time.     Cranial Nerves: No cranial nerve deficit.     Sensory: No sensory deficit.     Motor: No abnormal muscle tone.     Coordination: Coordination normal.  Psychiatric:        Mood and Affect: Mood normal.        Behavior: Behavior normal.        Thought Content: Thought content normal.        Judgment: Judgment normal.    ED Results / Procedures / Treatments   Labs (all labs ordered are listed, but only abnormal results are displayed) Labs Reviewed  COMPREHENSIVE METABOLIC PANEL - Abnormal; Notable for the following components:      Result Value   Sodium 133 (*)    Calcium 8.3 (*)    Albumin 3.0 (*)    All other components within normal limits  TROPONIN I (HIGH SENSITIVITY) - Abnormal; Notable for the following components:   Troponin I (High Sensitivity) 42 (*)    All other components within normal limits  TROPONIN I (HIGH SENSITIVITY) - Abnormal; Notable for the following components:   Troponin I (High Sensitivity) 47 (*)    All other components within normal limits  RESP PANEL BY RT-PCR (FLU A&B, COVID) ARPGX2  CBC WITH DIFFERENTIAL/PLATELET    EKG EKG Interpretation  Date/Time:  Wednesday May 26 2021 15:05:32 EST Ventricular Rate:  113 PR Interval:    QRS Duration: 86 QT Interval:  360 QTC Calculation: 481 R Axis:   75 Text Interpretation: Atrial fibrillation Paired ventricular premature  complexes Abnormal T, consider ischemia, lateral leads Since last tracing Premature ventricular complexes are new Otherwise no significant change Confirmed by Daleen Bo [phone]) on 05/26/2021 3:45:00 PM  Radiology DG Chest Port 1 View  Result Date: 05/26/2021 CLINICAL DATA:  Palpitations, shortness of breath EXAM: PORTABLE CHEST 1 VIEW COMPARISON:  Chest radiograph and CT  chest dated May 17, 2020 FINDINGS: The heart is normal in size. Atherosclerotic calcification of the aortic arch. Hyperinflated lungs. Bilateral interstitial markings prominent in the bilateral lower lobes consistent with fibrosis/interstitial lung disease. No appreciable focal consolidation or large pleural effusion. IMPRESSION: Emphysema and fibrotic changes of the lung bases without evidence of acute cardiopulmonary process. Electronically Signed   By: Keane Police D.O.   On: 05/26/2021 17:15    Procedures Procedures    Medications Ordered in ED Medications  metoprolol tartrate (LOPRESSOR) injection 5 mg (5 mg Intravenous Given 05/26/21 1842)  metoprolol tartrate (LOPRESSOR) tablet 25 mg (25 mg Oral Given 05/26/21 2148)    ED Course/ Medical Decision Making/ A&P                           Medical Decision Making Patient presents for evaluation of rapid heartbeat, symptomatic with dizziness and weakness.  He notes onset AFTER missing chiropractic treatment earlier today.  EMS treated him for A. fib with RVR, IV fluids with improvement.  He was recently diagnosed with new onset atrial fibrillation and started on anticoagulants and daily metoprolol today.  Problems Addressed: Atrial fibrillation with RVR ([place]): acute illness or injury    Details: Progressive chronic illness associated with missing a dose of metoprolol last night  Amount and/or Complexity of Data Reviewed External Data Reviewed: notes.    Details: Recent hospitalization for treatment of new onset atrial fibrillation, notes, laboratory testing and plans  noted Labs: ordered.    Details: CBC, metabolic panel, delta troponin, viral panel: Normal except sodium low, calcium low, albumin low, troponin high but stable with repeat testing.  This is lower than baseline, in the chart. Radiology: ordered.    Details: Chest x-ray-no infiltrate or edema ECG/medicine tests: ordered and independent interpretation performed.    Details: Cardiac monitor indicates atrial fibrillation with RVR, improving with IV fluids, and medications administered in the ED.  Risk Prescription drug management. Decision regarding hospitalization. Risk Details: Patient with somewhat unstable atrial fibrillation, sensitive to fluids and metoprolol.  He is on low-dose metoprolol at home currently, using a long-acting form.  Plan to increase patient to 25 mg twice a day, doubling dose, with more frequent treatment to control tachycardia.  He is counseled to avoid caffeine and other products that may stimulate heart rate.  He is discharged with his wife, in stable condition.  Continue current follow-up plan, return to ED if needed for symptoms that cannot be controlled with current medicines.  Critical Care Total time providing critical care: 30-74 minutes          Final Clinical Impression(s) / ED Diagnoses Final diagnoses:  Atrial fibrillation with RVR ([place])    Rx / DC Orders ED Discharge Orders          Ordered    metoprolol tartrate (LOPRESSOR) 25 MG tablet  2 times daily        05/26/21 2154              Daleen Bo, MD 05/27/21 1219

## 2021-05-26 NOTE — Discharge Instructions (Signed)
We are changing your metoprolol dosing to short acting pills, to take 25 mg, twice a day.  Do not take the long-acting metoprolol (XL) at this time.  Follow-up with your physicians as planned.

## 2021-11-22 ENCOUNTER — Encounter (HOSPITAL_COMMUNITY): Admission: RE | Admit: 2021-11-22 | Payer: No Typology Code available for payment source | Source: Ambulatory Visit

## 2021-12-06 ENCOUNTER — Encounter (HOSPITAL_COMMUNITY): Admission: RE | Admit: 2021-12-06 | Payer: No Typology Code available for payment source | Source: Ambulatory Visit

## 2021-12-14 ENCOUNTER — Encounter (HOSPITAL_COMMUNITY): Admission: RE | Admit: 2021-12-14 | Payer: No Typology Code available for payment source | Source: Ambulatory Visit

## 2022-07-17 NOTE — Progress Notes (Unsigned)
Electrophysiology Office Note:    Date:  07/20/2022   ID:  KOUA GROWNEY, DOB Mar 31, 1956, MRN [id number]  [facility] Cardiologist:  None  [facility] Electrophysiologist:  Vickie Epley, MD   Referring MD: [facility]   Chief Complaint: Atrial fibrillation  History of Present Illness:    Calvin Carroll is a 67 y.o. male who I am seeing today for an evaluation of atrial fibrillation at the request of Dr. Bettey Costa.  The patient receives care at the [facility].  He has a history of renal transplant, atrial fibrillation, COPD, hypertension, chronic diastolic heart failure.  According to Dr. Bettey Costa, the patient is experienced exacerbation of his chronic diastolic heart failure while in atrial fibrillation.  Dr. Bettey Costa discussed catheter ablation and antiarrhythmic drugs but the patient preferred to discuss catheter ablation.  His A-fib was diagnosed in April 2023 when he presented to the ER with fatigue and palpitations.  His heart rate was as high as 170 bpm.  He is on Eliquis for stroke prophylaxis.  Today he is doing well.  He does report more frequent episodes of atrial fibrillation, as many as 3 to 4/week.  They are highly symptomatic and associated with fatigue.  His heart rates are elevated during each episode.  They last for hours at a time.  He does take his Eliquis without bleeding issues.      Their past medical, social and family history was reveiwed.   ROS:   Please see the history of present illness.    All other systems reviewed and are negative.  EKGs/Labs/Other Studies Reviewed:    The following studies were reviewed today:  [facility]  [facility] 18, 2021 echo EF 55 to 60%, RV normal, mild AI and AS  Today's EKG shows sinus rhythm with intermittent right bundle branch block.   Physical Exam:    VS:  BP 116/62   Pulse 62   Ht 5\' 8"  (1.727 m)   Wt 114 lb (51.7 kg)   SpO2 92%   BMI 17.33 kg/m     Wt Readings from Last 3  Encounters:  07/20/22 114 lb (51.7 kg)  05/26/21 110 lb 3.7 oz (50 kg)  05/17/21 112 lb (50.8 kg)     GEN:  Well nourished, well developed in no acute distress CARDIAC: RRR, no murmurs, rubs, gallops RESPIRATORY:  Clear to auscultation without rales, wheezing or rhonchi       ASSESSMENT AND PLAN:    1. Atrial fibrillation, unspecified type ([place])   2. Chronic diastolic heart failure ([place])   3. Primary hypertension   4. Chronic obstructive pulmonary disease, unspecified COPD type ([place])   5. Tobacco abuse     #Atrial fibrillation On Eliquis for stroke prophylaxis  Discussed treatment options today for their AF including antiarrhythmic drug therapy and ablation. Discussed risks, recovery and likelihood of success. Discussed potential need for repeat ablation procedures and antiarrhythmic drugs after an initial ablation. They wish to proceed with scheduling.  Risk, benefits, and alternatives to EP study and radiofrequency ablation for afib were also discussed in detail today. These risks include but are not limited to stroke, bleeding, vascular damage, tamponade, perforation, damage to the esophagus, lungs, and other structures, pulmonary vein stenosis, worsening renal function, and death. The patient understands these risk and wishes to proceed.  We will therefore proceed with catheter ablation at the next available time.  Carto, ICE, anesthesia are requested for the procedure.  Will also obtain CT PV protocol  prior to the procedure to exclude LAA thrombus and further evaluate atrial anatomy.  #Chronic diastolic heart failure Continue metoprolol, lisinopril, torsemide  #Hypertension At goal today.  Recommend checking blood pressures 1-2 times per week at home and recording the values.  Recommend bringing these recordings to the primary care physician.  #COPD #Tobacco abuse Cessation encouraged.         Signed, Hilton Cork. Quentin Ore, MD, [facility], [facility] 07/20/2022 10:41 AM     Electrophysiology [facility]

## 2022-07-20 ENCOUNTER — Ambulatory Visit: Payer: No Typology Code available for payment source | Attending: Cardiology | Admitting: Cardiology

## 2022-07-20 ENCOUNTER — Encounter: Payer: Self-pay | Admitting: Cardiology

## 2022-07-20 VITALS — BP 116/62 | HR 62 | Ht 68.0 in | Wt 114.0 lb

## 2022-07-20 DIAGNOSIS — I10 Essential (primary) hypertension: Secondary | ICD-10-CM | POA: Diagnosis not present

## 2022-07-20 DIAGNOSIS — Z720 Tobacco use: Secondary | ICD-10-CM

## 2022-07-20 DIAGNOSIS — I4891 Unspecified atrial fibrillation: Secondary | ICD-10-CM | POA: Diagnosis not present

## 2022-07-20 DIAGNOSIS — I5032 Chronic diastolic (congestive) heart failure: Secondary | ICD-10-CM

## 2022-07-20 DIAGNOSIS — J449 Chronic obstructive pulmonary disease, unspecified: Secondary | ICD-10-CM | POA: Diagnosis not present

## 2022-07-20 NOTE — Patient Instructions (Signed)
Medication Instructions:  Your physician recommends that you continue on your current medications as directed. Please refer to the Current Medication list given to you today.  *If you need a refill on your cardiac medications before your next appointment, please call your pharmacy*  Lab Work: BMET and CBC prior to ablation on July 8th at [address], [place], [place], [place]. You can walk in anytime between 7:30am-5:00pm.   Testing/Procedures: Your physician has recommended that you have an ablation. Catheter ablation is a medical procedure used to treat some cardiac arrhythmias (irregular heartbeats). During catheter ablation, a long, thin, flexible tube is put into a blood vessel in your groin (upper thigh), or neck. This tube is called an ablation catheter. It is then guided to your heart through the blood vessel. Radio frequency waves destroy small areas of heart tissue where abnormal heartbeats may cause an arrhythmia to start. Please see the instruction sheet given to you today. You are scheduled for Atrial Fibrillation Ablation on Monday, July 15 with Dr. Lars Mage.Please arrive at the Main Entrance A at [facility]: [address] [place], [place] [address] at 5:30 AM    Follow-Up: At [facility], you and your health needs are our priority.  As part of our continuing mission to provide you with exceptional heart care, we have created designated Provider Care Teams.  These Care Teams include your primary Cardiologist (physician) and Advanced Practice Providers (APPs -  Physician Assistants and Nurse Practitioners) who all work together to provide you with the care you need, when you need it.  Your next appointment:   We will call you to arrange follow up appointments.

## 2022-07-20 NOTE — Addendum Note (Signed)
Addended by: Bernestine Amass on: 07/20/2022 10:45 AM   Modules accepted: Orders

## 2022-07-25 ENCOUNTER — Telehealth: Payer: Self-pay

## 2022-07-25 DIAGNOSIS — I4891 Unspecified atrial fibrillation: Secondary | ICD-10-CM

## 2022-07-25 NOTE — Telephone Encounter (Signed)
Patient will need a CT scan prior to ablation. Orders placed and patient is aware.

## 2022-08-12 ENCOUNTER — Emergency Department (HOSPITAL_COMMUNITY): Payer: No Typology Code available for payment source

## 2022-08-12 ENCOUNTER — Observation Stay (HOSPITAL_COMMUNITY): Payer: No Typology Code available for payment source

## 2022-08-12 ENCOUNTER — Encounter (HOSPITAL_COMMUNITY): Payer: Self-pay | Admitting: *Deleted

## 2022-08-12 ENCOUNTER — Other Ambulatory Visit: Payer: Self-pay

## 2022-08-12 ENCOUNTER — Inpatient Hospital Stay (HOSPITAL_COMMUNITY)
Admission: EM | Admit: 2022-08-12 | Discharge: 2022-08-19 | DRG: 193 | Disposition: A | Discharge status: Home / Self Care | Payer: No Typology Code available for payment source | Attending: Family Medicine | Admitting: Family Medicine

## 2022-08-12 DIAGNOSIS — I10 Essential (primary) hypertension: Secondary | ICD-10-CM | POA: Diagnosis not present

## 2022-08-12 DIAGNOSIS — E871 Hypo-osmolality and hyponatremia: Secondary | ICD-10-CM | POA: Diagnosis present

## 2022-08-12 DIAGNOSIS — I4892 Unspecified atrial flutter: Secondary | ICD-10-CM | POA: Diagnosis not present

## 2022-08-12 DIAGNOSIS — I252 Old myocardial infarction: Secondary | ICD-10-CM

## 2022-08-12 DIAGNOSIS — I130 Hypertensive heart and chronic kidney disease with heart failure and stage 1 through stage 4 chronic kidney disease, or unspecified chronic kidney disease: Secondary | ICD-10-CM | POA: Diagnosis present

## 2022-08-12 DIAGNOSIS — J181 Lobar pneumonia, unspecified organism: Secondary | ICD-10-CM | POA: Diagnosis not present

## 2022-08-12 DIAGNOSIS — F17210 Nicotine dependence, cigarettes, uncomplicated: Secondary | ICD-10-CM | POA: Diagnosis present

## 2022-08-12 DIAGNOSIS — Z7901 Long term (current) use of anticoagulants: Secondary | ICD-10-CM

## 2022-08-12 DIAGNOSIS — R0602 Shortness of breath: Secondary | ICD-10-CM | POA: Diagnosis not present

## 2022-08-12 DIAGNOSIS — J9601 Acute respiratory failure with hypoxia: Secondary | ICD-10-CM | POA: Diagnosis not present

## 2022-08-12 DIAGNOSIS — J441 Chronic obstructive pulmonary disease with (acute) exacerbation: Secondary | ICD-10-CM | POA: Diagnosis present

## 2022-08-12 DIAGNOSIS — I4510 Unspecified right bundle-branch block: Secondary | ICD-10-CM | POA: Diagnosis present

## 2022-08-12 DIAGNOSIS — E860 Dehydration: Secondary | ICD-10-CM | POA: Diagnosis present

## 2022-08-12 DIAGNOSIS — J440 Chronic obstructive pulmonary disease with acute lower respiratory infection: Secondary | ICD-10-CM | POA: Diagnosis present

## 2022-08-12 DIAGNOSIS — I484 Atypical atrial flutter: Secondary | ICD-10-CM | POA: Diagnosis present

## 2022-08-12 DIAGNOSIS — Q2112 Patent foramen ovale: Secondary | ICD-10-CM

## 2022-08-12 DIAGNOSIS — J439 Emphysema, unspecified: Secondary | ICD-10-CM | POA: Diagnosis present

## 2022-08-12 DIAGNOSIS — I480 Paroxysmal atrial fibrillation: Secondary | ICD-10-CM | POA: Diagnosis present

## 2022-08-12 DIAGNOSIS — I4891 Unspecified atrial fibrillation: Principal | ICD-10-CM

## 2022-08-12 DIAGNOSIS — Z1152 Encounter for screening for COVID-19: Secondary | ICD-10-CM

## 2022-08-12 DIAGNOSIS — I2489 Other forms of acute ischemic heart disease: Secondary | ICD-10-CM | POA: Diagnosis present

## 2022-08-12 DIAGNOSIS — J449 Chronic obstructive pulmonary disease, unspecified: Secondary | ICD-10-CM | POA: Diagnosis present

## 2022-08-12 DIAGNOSIS — N179 Acute kidney failure, unspecified: Secondary | ICD-10-CM | POA: Diagnosis not present

## 2022-08-12 DIAGNOSIS — Z79899 Other long term (current) drug therapy: Secondary | ICD-10-CM

## 2022-08-12 DIAGNOSIS — I2510 Atherosclerotic heart disease of native coronary artery without angina pectoris: Secondary | ICD-10-CM | POA: Diagnosis present

## 2022-08-12 DIAGNOSIS — Z79621 Long term (current) use of calcineurin inhibitor: Secondary | ICD-10-CM

## 2022-08-12 DIAGNOSIS — Z940 Kidney transplant status: Secondary | ICD-10-CM

## 2022-08-12 DIAGNOSIS — I5033 Acute on chronic diastolic (congestive) heart failure: Secondary | ICD-10-CM | POA: Diagnosis not present

## 2022-08-12 DIAGNOSIS — I350 Nonrheumatic aortic (valve) stenosis: Secondary | ICD-10-CM | POA: Diagnosis present

## 2022-08-12 DIAGNOSIS — E785 Hyperlipidemia, unspecified: Secondary | ICD-10-CM | POA: Diagnosis present

## 2022-08-12 DIAGNOSIS — N1831 Chronic kidney disease, stage 3a: Secondary | ICD-10-CM | POA: Diagnosis present

## 2022-08-12 DIAGNOSIS — T8619 Other complication of kidney transplant: Secondary | ICD-10-CM | POA: Diagnosis present

## 2022-08-12 DIAGNOSIS — R000 Tachycardia, unspecified: Secondary | ICD-10-CM | POA: Diagnosis not present

## 2022-08-12 HISTORY — DX: Acute respiratory failure with hypoxia: J96.01

## 2022-08-12 LAB — RESPIRATORY PANEL BY PCR

## 2022-08-12 LAB — CBC
HCT: 41.5 % (ref 39.0–52.0)
Hemoglobin: 14 g/dL (ref 13.0–17.0)
MCH: 32 pg (ref 26.0–34.0)
MCHC: 33.7 g/dL (ref 30.0–36.0)
MCV: 95 fL (ref 80.0–100.0)
Platelets: 199 10*3/uL (ref 150–400)
RBC: 4.37 MIL/uL (ref 4.22–5.81)
RDW: 14.7 % (ref 11.5–15.5)
WBC: 10.5 10*3/uL (ref 4.0–10.5)
nRBC: 0 % (ref 0.0–0.2)

## 2022-08-12 LAB — BASIC METABOLIC PANEL
Anion gap: 11 (ref 5–15)
BUN: 41 mg/dL — ABNORMAL HIGH (ref 8–23)
CO2: 24 mmol/L (ref 22–32)
Calcium: 9 mg/dL (ref 8.9–10.3)
Chloride: 97 mmol/L — ABNORMAL LOW (ref 98–111)
Creatinine, Ser: 1.52 mg/dL — ABNORMAL HIGH (ref 0.61–1.24)
GFR, Estimated: 51 mL/min — ABNORMAL LOW (ref >60)
Glucose, Bld: 190 mg/dL — ABNORMAL HIGH (ref 70–99)
Potassium: 4.1 mmol/L (ref 3.5–5.1)
Sodium: 132 mmol/L — ABNORMAL LOW (ref 135–145)

## 2022-08-12 LAB — HIV ANTIBODY (ROUTINE TESTING W REFLEX): HIV Screen 4th Generation wRfx: NONREACTIVE

## 2022-08-12 LAB — RESP PANEL BY RT-PCR (RSV, FLU A&B, COVID)  RVPGX2
Influenza A by PCR: NEGATIVE
Influenza B by PCR: NEGATIVE
Resp Syncytial Virus by PCR: NEGATIVE
SARS Coronavirus 2 by RT PCR: NEGATIVE

## 2022-08-12 LAB — MRSA NEXT GEN BY PCR, NASAL: MRSA by PCR Next Gen: NOT DETECTED

## 2022-08-12 LAB — PROCALCITONIN: Procalcitonin: 0.26 ng/mL

## 2022-08-12 LAB — BRAIN NATRIURETIC PEPTIDE: B Natriuretic Peptide: 1240 pg/mL — ABNORMAL HIGH (ref 0.0–100.0)

## 2022-08-12 LAB — MAGNESIUM: Magnesium: 1.9 mg/dL (ref 1.7–2.4)

## 2022-08-12 LAB — HEMOGLOBIN A1C
Hgb A1c MFr Bld: 5.4 % (ref 4.8–5.6)
Mean Plasma Glucose: 108.28 mg/dL

## 2022-08-12 MED ORDER — METOPROLOL TARTRATE 5 MG/5ML IV SOLN
5.0000 mg | Freq: Once | INTRAVENOUS | Status: AC
  Administered 2022-08-12: 5 mg via INTRAVENOUS
  Filled 2022-08-12: qty 5

## 2022-08-12 MED ORDER — ARFORMOTEROL TARTRATE 15 MCG/2ML IN NEBU
15.0000 ug | INHALATION_SOLUTION | Freq: Two times a day (BID) | RESPIRATORY_TRACT | Status: DC
  Administered 2022-08-12 – 2022-08-19 (×14): 15 ug via RESPIRATORY_TRACT
  Filled 2022-08-12 (×14): qty 2

## 2022-08-12 MED ORDER — ACETAMINOPHEN 325 MG PO TABS
650.0000 mg | ORAL_TABLET | Freq: Four times a day (QID) | ORAL | Status: DC | PRN
  Administered 2022-08-16 – 2022-08-18 (×4): 650 mg via ORAL
  Filled 2022-08-12 (×4): qty 2

## 2022-08-12 MED ORDER — TACROLIMUS 1 MG PO CAPS
1.5000 mg | ORAL_CAPSULE | Freq: Every day | ORAL | Status: DC
  Administered 2022-08-13 – 2022-08-19 (×7): 1.5 mg via ORAL
  Filled 2022-08-12 (×10): qty 1

## 2022-08-12 MED ORDER — METOPROLOL TARTRATE 50 MG PO TABS
50.0000 mg | ORAL_TABLET | Freq: Three times a day (TID) | ORAL | Status: DC
  Administered 2022-08-12 – 2022-08-19 (×19): 50 mg via ORAL
  Filled 2022-08-12 (×5): qty 1
  Filled 2022-08-12: qty 2
  Filled 2022-08-12 (×14): qty 1

## 2022-08-12 MED ORDER — FUROSEMIDE 10 MG/ML IJ SOLN
40.0000 mg | Freq: Once | INTRAMUSCULAR | Status: AC
  Administered 2022-08-12: 40 mg via INTRAVENOUS
  Filled 2022-08-12: qty 4

## 2022-08-12 MED ORDER — DILTIAZEM HCL-DEXTROSE 125-5 MG/125ML-% IV SOLN (PREMIX)
5.0000 mg/h | INTRAVENOUS | Status: DC
  Administered 2022-08-12: 5 mg/h via INTRAVENOUS
  Administered 2022-08-13 – 2022-08-16 (×6): 10 mg/h via INTRAVENOUS
  Filled 2022-08-12 (×7): qty 125

## 2022-08-12 MED ORDER — DILTIAZEM HCL 25 MG/5ML IV SOLN
10.0000 mg | Freq: Once | INTRAVENOUS | Status: AC
  Administered 2022-08-12: 10 mg via INTRAVENOUS
  Filled 2022-08-12: qty 5

## 2022-08-12 MED ORDER — DOXYCYCLINE HYCLATE 100 MG PO TABS
100.0000 mg | ORAL_TABLET | Freq: Two times a day (BID) | ORAL | Status: AC
  Administered 2022-08-12 – 2022-08-18 (×14): 100 mg via ORAL
  Filled 2022-08-12 (×14): qty 1

## 2022-08-12 MED ORDER — GUAIFENESIN-DM 100-10 MG/5ML PO SYRP
5.0000 mL | ORAL_SOLUTION | ORAL | Status: DC | PRN
  Administered 2022-08-12: 5 mL via ORAL
  Filled 2022-08-12: qty 5

## 2022-08-12 MED ORDER — ATORVASTATIN CALCIUM 40 MG PO TABS
40.0000 mg | ORAL_TABLET | Freq: Every day | ORAL | Status: DC
  Administered 2022-08-12 – 2022-08-17 (×6): 40 mg via ORAL
  Filled 2022-08-12 (×8): qty 1

## 2022-08-12 MED ORDER — APIXABAN 5 MG PO TABS
5.0000 mg | ORAL_TABLET | Freq: Two times a day (BID) | ORAL | Status: DC
  Administered 2022-08-12 – 2022-08-17 (×11): 5 mg via ORAL
  Filled 2022-08-12 (×11): qty 1

## 2022-08-12 MED ORDER — TACROLIMUS 1 MG PO CAPS: 1.0000 mg | ORAL_CAPSULE | ORAL | Status: DC

## 2022-08-12 MED ORDER — TACROLIMUS 0.5 MG PO CAPS: 0.5000 mg | ORAL_CAPSULE | ORAL | Status: DC

## 2022-08-12 MED ORDER — TACROLIMUS 1 MG PO CAPS
2.0000 mg | ORAL_CAPSULE | Freq: Every day | ORAL | Status: DC
  Administered 2022-08-12 – 2022-08-18 (×7): 2 mg via ORAL
  Filled 2022-08-12 (×10): qty 2

## 2022-08-12 MED ORDER — ACETAMINOPHEN 650 MG RE SUPP: 650.0000 mg | Freq: Four times a day (QID) | RECTAL | Status: DC | PRN

## 2022-08-12 MED ORDER — CHLORHEXIDINE GLUCONATE CLOTH 2 % EX PADS
6.0000 | MEDICATED_PAD | Freq: Every day | CUTANEOUS | Status: DC
  Administered 2022-08-12 – 2022-08-19 (×8): 6 via TOPICAL

## 2022-08-12 MED ORDER — IPRATROPIUM-ALBUTEROL 0.5-2.5 (3) MG/3ML IN SOLN
3.0000 mL | Freq: Four times a day (QID) | RESPIRATORY_TRACT | Status: DC
  Administered 2022-08-12 (×2): 3 mL via RESPIRATORY_TRACT
  Filled 2022-08-12 (×2): qty 3

## 2022-08-12 MED ORDER — ONDANSETRON HCL 4 MG PO TABS: 4.0000 mg | ORAL_TABLET | Freq: Four times a day (QID) | ORAL | Status: DC | PRN

## 2022-08-12 MED ORDER — HYDROCODONE BIT-HOMATROP MBR 5-1.5 MG/5ML PO SOLN
5.0000 mL | Freq: Four times a day (QID) | ORAL | Status: DC | PRN
  Administered 2022-08-12 – 2022-08-15 (×6): 5 mL via ORAL
  Filled 2022-08-12 (×7): qty 5

## 2022-08-12 MED ORDER — IPRATROPIUM-ALBUTEROL 0.5-2.5 (3) MG/3ML IN SOLN
3.0000 mL | Freq: Three times a day (TID) | RESPIRATORY_TRACT | Status: DC
  Administered 2022-08-13 – 2022-08-14 (×4): 3 mL via RESPIRATORY_TRACT
  Filled 2022-08-12 (×4): qty 3

## 2022-08-12 MED ORDER — ONDANSETRON HCL 4 MG/2ML IJ SOLN
4.0000 mg | Freq: Four times a day (QID) | INTRAMUSCULAR | Status: DC | PRN
  Administered 2022-08-15 – 2022-08-16 (×4): 4 mg via INTRAVENOUS
  Filled 2022-08-12 (×4): qty 2

## 2022-08-12 MED ORDER — METOPROLOL TARTRATE 50 MG PO TABS: 50.0000 mg | ORAL_TABLET | Freq: Two times a day (BID) | ORAL | Status: DC

## 2022-08-12 MED ORDER — BUDESONIDE 0.5 MG/2ML IN SUSP
0.5000 mg | Freq: Two times a day (BID) | RESPIRATORY_TRACT | Status: DC
  Administered 2022-08-12 – 2022-08-19 (×15): 0.5 mg via RESPIRATORY_TRACT
  Filled 2022-08-12 (×15): qty 2

## 2022-08-12 MED ORDER — METHYLPREDNISOLONE SODIUM SUCC 125 MG IJ SOLR
125.0000 mg | Freq: Once | INTRAMUSCULAR | Status: AC
  Administered 2022-08-12: 125 mg via INTRAVENOUS
  Filled 2022-08-12: qty 2

## 2022-08-12 MED ORDER — METHYLPREDNISOLONE SODIUM SUCC 40 MG IJ SOLR
40.0000 mg | Freq: Two times a day (BID) | INTRAMUSCULAR | Status: DC
  Administered 2022-08-13 – 2022-08-17 (×9): 40 mg via INTRAVENOUS
  Filled 2022-08-12 (×10): qty 1

## 2022-08-12 MED ORDER — VITAMIN B-12 1000 MCG PO TABS
1000.0000 ug | ORAL_TABLET | Freq: Every day | ORAL | Status: DC
  Administered 2022-08-13 – 2022-08-19 (×7): 1000 ug via ORAL
  Filled 2022-08-12 (×7): qty 1

## 2022-08-12 MED ORDER — PANTOPRAZOLE SODIUM 40 MG PO TBEC
40.0000 mg | DELAYED_RELEASE_TABLET | Freq: Every day | ORAL | Status: DC
  Administered 2022-08-12 – 2022-08-18 (×7): 40 mg via ORAL
  Filled 2022-08-12 (×7): qty 1

## 2022-08-12 NOTE — H&P (Signed)
History and Physical    Patient: Calvin Carroll DOB: Feb 21, 1956 DOA: 08/12/2022 DOS: the patient was seen and examined on 08/12/2022 PCP: [facility]  Patient coming from: Home  Chief Complaint:  Chief Complaint  Patient presents with   Shortness of Breath   HPI: Calvin Carroll is a 67 year old male with a history of renal transplant x 2 in 2003 and 2011, COPD, tobacco abuse, coronary artery disease with history of MI, atrial fibrillation, and hyperlipidemia presenting with 2-week history of shortness of breath, coughing, and chest congestion.  He states that he has been coughing up some yellow sputum, but denies any hemoptysis.  He has had some subjective sweats but has not taken his temperature.  Notably, the patient has a portable heart monitor that he has been using and has noted that he has had atrial fibrillation with RVR in the past 4 days.  He states that his blood pressure has been running soft with systolic BP in the low 100s.  He denies any syncope, but feels tired and short of breath.  He states that he did have some chest discomfort when he was having RVR with his atrial fibrillation.  At present, the patient denies any chest discomfort, nausea, vomiting or direct abdominal pain.  He continues to smoke slightly over 1 pack/day.  He states that he last picked up a cigarette on 08/08/2022.  Because of his atrial fibrillation and rapid heart rate he presented for further evaluation and treatment.  He denies any worsening lower extremity edema or orthopnea. In the ED, the patient was afebrile hemodynamically stable with oxygen saturation 90% on room air.  WBC 10.5, hemoglobin 14.0, platelets 199,000.  Sodium 132, potassium 4.1, bicarbonate 24, serum creatinine 1.52.  COVID-19 PCR was negative.  BNP 1240.  Chest x-ray showed chronic interstitial markings and hyperinflation.  EKG showed atrial flutter with right bundle branch block.  The patient was started on  diltiazem.  Cardiology was consulted to assist with management.  Review of Systems: As mentioned in the history of present illness. All other systems reviewed and are negative. Past Medical History:  Diagnosis Date   Atrial fibrillation    COPD (chronic obstructive pulmonary disease)    Hypertension    MI (myocardial infarction)    Renal disorder    Past Surgical History:  Procedure Laterality Date   KIDNEY TRANSPLANT     [facility]   KIDNEY TRANSPLANT  2011   Pt has had 2 transplants   NEPHRECTOMY TRANSPLANTED ORGAN     Social History:  reports that he has been smoking cigarettes. He has been smoking an average of 1.5 packs per day. He has never used smokeless tobacco. He reports current alcohol use. He reports that he does not use drugs.  No Known Allergies  History reviewed. No pertinent family history.  Prior to Admission medications   Medication Sig Start Date End Date Taking? Authorizing Provider  albuterol (PROVENTIL HFA;VENTOLIN HFA) 108 (90 Base) MCG/ACT inhaler Inhale 1-2 puffs into the lungs every 4 (four) hours as needed for wheezing or shortness of breath.    [provider]  apixaban (ELIQUIS) 5 MG TABS tablet Take 1 tablet (5 mg total) by mouth 2 (two) times daily. 05/18/21   Johnson, Clanford L, MD  atorvastatin (LIPITOR) 40 MG tablet Take 40 mg by mouth at bedtime. 10/29/20   [provider]  Cholecalciferol (VITAMIN D) 50 MCG (2000 UT) tablet Take 2,000 Units by mouth daily.  [provider]  Cyanocobalamin (B-12) 500 MCG TABS Take 1,000 mcg by mouth daily.    [provider]  fexofenadine (ALLEGRA) 180 MG tablet Take 180 mg by mouth daily as needed for allergies. 08/05/04   [provider]  flunisolide (NASALIDE) 25 MCG/ACT (0.025%) SOLN Place 2 sprays into the nose 2 (two) times daily.    [provider]  hydrALAZINE (APRESOLINE) 25 MG tablet Take 25 mg by mouth 3 (three) times daily.    [provider]   lisinopril (ZESTRIL) 40 MG tablet Take 40 mg by mouth daily.    [provider]  metoprolol tartrate (LOPRESSOR) 25 MG tablet Take 2 tablets (50 mg total) by mouth 2 (two) times daily. Patient not taking: Reported on 12/03/2021 05/26/21   Mancel Bale, MD  metoprolol tartrate (LOPRESSOR) 50 MG tablet Take 50 mg by mouth 2 (two) times daily.    [provider]  mycophenolate (MYFORTIC) 180 MG EC tablet Take 180 mg by mouth 2 (two) times daily.    [provider]  pantoprazole (PROTONIX) 40 MG tablet Take 40 mg by mouth daily before supper.    [provider]  prednisoLONE acetate (PRED FORTE) 1 % ophthalmic suspension Place 2 drops into both eyes daily.    [provider]  predniSONE (DELTASONE) 5 MG tablet Take 5 mg by mouth every evening.    [provider]  tacrolimus (PROGRAF) 0.5 MG capsule Take 0.5 mg by mouth See admin instructions. Take 0.5 mg daily in the morning with the 1 mg capsule to equal 1.5 mg in the morning    [provider]  tacrolimus (PROGRAF) 1 MG capsule Take 1 mg by mouth See admin instructions. Take 1 mg in the morning (take with 0.5 mg to equal 1.5 mg in the morning) and 2 mg in the evening    [provider]  Tiotropium Bromide-Olodaterol (STIOLTO RESPIMAT) 2.5-2.5 MCG/ACT AERS Inhale 2 each into the lungs daily.    [provider]    Physical Exam: Vitals:   08/12/22 1230 08/12/22 1240 08/12/22 1250 08/12/22 1300  BP: 120/73   118/69  Pulse: 77 88 (!) 102 (!) 127  Resp: (!) 25 (!) 34 (!) 27 (!) 22  Temp:      TempSrc:      SpO2:  91%  91%   GENERAL:  A&O x 3, NAD, well developed, cooperative, follows commands HEENT: [place]/AT, No thrush, No icterus, No oral ulcers Neck:  No neck mass, No meningismus, soft, supple CV: IRRR, no S3, no S4, no rub, no JVD Lungs:  diminished BS.  Bibasilar rales.  Minimal bibasilar wheeze Abd: soft/NT +BS, nondistended Ext: No edema, no lymphangitis, no  cyanosis, no rashes Neuro:  CN II-XII intact, strength 4/5 in RUE, RLE, strength 4/5 LUE, LLE; sensation intact bilateral; no dysmetria; babinski equivocal  Data Reviewed: Data reviewed above in hx  Assessment and Plan: Acute respiratory failure with hypoxia -Secondary COPD exacerbation -Stable on 2 L nasal cannula -Presented with hypoxia and tachypnea -Wean oxygen for saturation greater 92%  COPD exacerbation -Start IV Solu-Medrol -Start Pulmicort -Started on duonebs -Start Brovana -COVID/Flu/RSV neg -viral resp panel  Atrial fibrillation with RVR, type unspecified -pt states he is due to ablation on 11/07/22 -appreciate cardiology consult -restart metoprolol -continue diltiazem -continue apixaban  Renal transplant recipient -continue tacrolimus and azathioprine -no longer taking mycophenolate -on IV solumedrol so hold prednisone  Tobacco abuse -tobacco cessation discussed  Hyperlipidemia -continue statin  CAD -  no chest pain presently -continue statin, metoprolol, apixaban     Advance Care Planning: FULL  Consults: cardiology  Family Communication: none  Severity of Illness: The appropriate patient status for this patient is OBSERVATION. Observation status is judged to be reasonable and necessary in order to provide the required intensity of service to ensure the patient's safety. The patient's presenting symptoms, physical exam findings, and initial radiographic and laboratory data in the context of their medical condition is felt to place them at decreased risk for further clinical deterioration. Furthermore, it is anticipated that the patient will be medically stable for discharge from the hospital within 2 midnights of admission.   Author: Catarina Hartshorn, MD 08/12/2022 1:11 PM  For on call review [email or web address].

## 2022-08-12 NOTE — Consult Note (Signed)
Cardiology Consultation   Patient ID: Calvin Carroll MRN: [id number]; DOB: [birth year]  Admit date: 08/12/2022 Date of Consult: 08/12/2022  PCP:  Clinic, [facility] Providers Cardiologist:  None  Electrophysiologist:  Calvin Prude, MD       Patient Profile:   Calvin Carroll is a 67 y.o. male with a hx of COPD, HTN, renal transplant, afib,  who is being seen 08/12/2022 for the evaluation of SOB at the request of Dr Calvin Carroll.  History of Present Illness:   Follows with cardiology at the [place]   Calvin Carroll 67 yo male history of COPD, HTN, renal transplant, afib with plans for ablation in 10/2022 presents with SOB, palpitations chest pain. Monitors heart rhythms at home, 2 episodes of afib last week that terminated with extra metoprolol. Episode Monday that also terminated. Recurrent episode Tuesday without any resolution, presented to ER today. Feelings of heart racing, SOB, chest pressure. Reports medication compliance.    K 4.1 Cr 1.52 BUN 41 Mg 1.9 WBC 10.5 Hgb 14 Plt 199 BNP 1240  CXR no acute process EKG aflutter RVR, RBBB  Jan 2023 echo: LVEF 55-60%, grade I dd, normal RV function  Past Medical History:  Diagnosis Date   Atrial fibrillation    COPD (chronic obstructive pulmonary disease)    Hypertension    MI (myocardial infarction)    Renal disorder     Past Surgical History:  Procedure Laterality Date   KIDNEY TRANSPLANT     [facility]   KIDNEY TRANSPLANT  2011   Pt has had 2 transplants   NEPHRECTOMY TRANSPLANTED ORGAN        Inpatient Medications: Scheduled Meds:  Continuous Infusions:  PRN Meds:   Allergies:   No Known Allergies  Social History:   Social History   Socioeconomic History   Marital status: Divorced    Spouse name: Not on file   Number of children: Not on file   Years of education: Not on file   Highest education level: Not on file  Occupational History   Not on file  Tobacco Use   Smoking  status: Every Day    Packs/day: 1.5    Types: Cigarettes   Smokeless tobacco: Never  Substance and Sexual Activity   Alcohol use: Yes    Comment: couple of beers in the afternoon 3-4 days a week   Drug use: No   Sexual activity: Not on file  Other Topics Concern   Not on file  Social History Narrative   Not on file   Social Determinants of Health   Financial Resource Strain: Not on file  Food Insecurity: Not on file  Transportation Needs: Not on file  Physical Activity: Not on file  Stress: Not on file  Social Connections: Not on file  Intimate Partner Violence: Not on file    Family History:   History reviewed. No pertinent family history.   ROS:  Please see the history of present illness.   All other ROS reviewed and negative.     Physical Exam/Data:   Vitals:   08/12/22 1219 08/12/22 1220 08/12/22 1221 08/12/22 1230  BP:    120/73  Pulse: 77 85 92 77  Resp: (!) 30 (!) 21 (!) 31 (!) 35  Temp:      TempSrc:      SpO2:   92%    No intake or output data in the 24 hours ending 08/12/22 1248    07/20/2022  10:12 AM 05/26/2021    3:00 PM 05/17/2021    2:32 PM  Last 3 Weights  Weight (lbs) 114 lb 110 lb 3.7 oz 112 lb  Weight (kg) 51.71 kg 50 kg 50.803 kg     There is no height or weight on file to calculate BMI.  General:  Well nourished, well developed, in no acute distress HEENT: normal Neck: no JVD Vascular: No carotid bruits; Distal pulses 2+ bilaterally Cardiac:  irreg, tachy Lungs:  clear to auscultation bilaterally, no wheezing, rhonchi or rales  Abd: soft, nontender, no hepatomegaly  Ext: no edema Musculoskeletal:  No deformities, BUE and BLE strength normal and equal Skin: warm and dry  Neuro:  CNs 2-12 intact, no focal abnormalities noted Psych:  Normal affect     Laboratory Data:  High Sensitivity Troponin:  No results for input(s): "TROPONINIHS" in the last 720 hours.   Chemistry Recent Labs  Lab 08/12/22 1124  NA 132*  K 4.1  CL 97*   CO2 24  GLUCOSE 190*  BUN 41*  CREATININE 1.52*  CALCIUM 9.0  MG 1.9  GFRNONAA 51*  ANIONGAP 11    No results for input(s): "PROT", "ALBUMIN", "AST", "ALT", "ALKPHOS", "BILITOT" in the last 168 hours. Lipids No results for input(s): "CHOL", "TRIG", "HDL", "LABVLDL", "LDLCALC", "CHOLHDL" in the last 168 hours.  Hematology Recent Labs  Lab 08/12/22 1124  WBC 10.5  RBC 4.37  HGB 14.0  HCT 41.5  MCV 95.0  MCH 32.0  MCHC 33.7  RDW 14.7  PLT 199   Thyroid No results for input(s): "TSH", "FREET4" in the last 168 hours.  BNP Recent Labs  Lab 08/12/22 1124  BNP 1,240.0*    DDimer No results for input(s): "DDIMER" in the last 168 hours.   Radiology/Studies:  DG Chest Port 1 View  Result Date: 08/12/2022 CLINICAL DATA:  Shortness of breath EXAM: PORTABLE CHEST 1 VIEW COMPARISON:  Radiograph 05/26/2021, 05/17/2021 FINDINGS: Unchanged cardiomediastinal silhouette. Emphysema with prominent interstitial opacities and peripheral reticulation, similar to previous exams. No new focal airspace disease. No large effusion or evidence of pneumothorax. IMPRESSION: Emphysema with chronic interstitial changes. No new focal airspace disease. Electronically Signed   By: Calvin Carroll M.D.   On: 08/12/2022 11:51     Assessment and Plan:   1.Afib/aflutter - history of afib followed by EP with plans for ablation in July - EKG this admit looks more like atypical aflutter with RVR - EKG 07/20/22 did show NSR at 57 - he is on lopressor 50mg  bid at home along with eliquis  - given 10mg  IV dilt in ER, rates from 150s to 110-120s. Will give additional 5mg  IV lopressor - increase oral lopressor from 50mg  bid to tid with hold parameters. Follow HRs particularly any bradycardia with conversion back to SR.  - if persistent flutter may require DCCV on Monday once euvoelmic. Would make npo Sunday night if needed. He reports has not missed any doses of eliquis within the last 3 weeks   2.Acute on chronic  HFpEF - Jan 2023 echo: LVEF 55-60%, grade I dd, normal RV function - BNP 1240, CXR no acute process - exacerbated in the setting of aflutter with RVR  - volume overloaded, dose IV lasix 40mg  x 1 today. Reassess repeat dosing tomorrow, likely will need a few days of IV diuresis.    3. History of renal transplant - on tacrolimus, prednsione, mycophenolate - any med changes will need to cross check with his immunosuppresants.   4.HTN -  would hold home bps meds to allow high av nodal dosing.    For questions or updates, please contact [facility] Please consult [email or web address] for contact info under    Signed, Dina Rich, MD  08/12/2022 12:48 PM

## 2022-08-12 NOTE — ED Triage Notes (Signed)
Pt c/o sob and cough that has gotten worse over the last 3 days  Pt states he has a hx of afib

## 2022-08-12 NOTE — ED Notes (Signed)
See triage notes. Pt c/o cough more than normal x 2 weeks with "greenish yellow" sputum. C/o a fib "acting up" x 3 days. Has taken meds as prescribed and extra metorprolol  yesterday.usually takes metoprolol  bid. Color wnl. A/o. Denies fevers.

## 2022-08-12 NOTE — ED Notes (Signed)
2nd ecg obtained after cardizem given, hr went down to 95-115. Pt states he is breathing better. EDP aware.

## 2022-08-12 NOTE — Hospital Course (Signed)
67 year old male with a history of renal transplant x 2 in 2003 and 2011, COPD, tobacco abuse, coronary artery disease with history of MI, atrial fibrillation, and hyperlipidemia presenting with 2-week history of shortness of breath, coughing, and chest congestion.  He states that he has been coughing up some yellow sputum, but denies any hemoptysis.  He has had some subjective sweats but has not taken his temperature.  Notably, the patient has a portable heart monitor that he has been using and has noted that he has had atrial fibrillation with RVR in the past 4 days.  He states that his blood pressure has been running soft with systolic BP in the low 100s.  He denies any syncope, but feels tired and short of breath.  He states that he did have some chest discomfort when he was having RVR with his atrial fibrillation.  At present, the patient denies any chest discomfort, nausea, vomiting or direct abdominal pain.  He continues to smoke slightly over 1 pack/day.  He states that he last picked up a cigarette on 08/08/2022.  Because of his atrial fibrillation and rapid heart rate he presented for further evaluation and treatment.  He denies any worsening lower extremity edema or orthopnea. In the ED, the patient was afebrile hemodynamically stable with oxygen saturation 90% on room air.  WBC 10.5, hemoglobin 14.0, platelets 199,000.  Sodium 132, potassium 4.1, bicarbonate 24, serum creatinine 1.52.  COVID-19 PCR was negative.  BNP 1240.  Chest x-ray showed chronic interstitial markings and hyperinflation.  EKG showed atrial flutter with right bundle branch block.  The patient was started on diltiazem.  Cardiology was consulted to assist with management.

## 2022-08-12 NOTE — ED Notes (Signed)
RT aware of tx 

## 2022-08-12 NOTE — ED Provider Notes (Signed)
[place] EMERGENCY DEPARTMENT AT [facility] Provider Note   CSN: [id number] Arrival date & time: 08/12/22  1021     History  Chief Complaint  Patient presents with   Shortness of Breath    Calvin Carroll is a 67 y.o. male.  He has a history of atrial fibrillation on anticoagulation, COPD, CHF, and has a renal transplant.  He has had 2 weeks of cough productive of yellow sputum and shortness of breath.  For the last 4 days he felt like he has been in A-fib with rapid heart rate and low blood pressure.  He is continue to take his regular medications.  He has an upcoming ablation with cardiology in July.  The history is provided by the patient.  Shortness of Breath Severity:  Moderate Onset quality:  Gradual Duration:  2 weeks Timing:  Constant Progression:  Unchanged Chronicity:  Recurrent Relieved by:  Nothing Worsened by:  Activity Ineffective treatments:  Rest Associated symptoms: cough and sputum production   Associated symptoms: no abdominal pain, no chest pain, no fever, no hemoptysis and no wheezing   Risk factors: tobacco use        Home Medications Prior to Admission medications   Medication Sig Start Date End Date Taking? Authorizing Provider  albuterol (PROVENTIL HFA;VENTOLIN HFA) 108 (90 Base) MCG/ACT inhaler Inhale 1-2 puffs into the lungs every 4 (four) hours as needed for wheezing or shortness of breath.    [provider]  apixaban (ELIQUIS) 5 MG TABS tablet Take 1 tablet (5 mg total) by mouth 2 (two) times daily. 05/18/21   Johnson, Clanford L, MD  atorvastatin (LIPITOR) 40 MG tablet Take 40 mg by mouth at bedtime. 10/29/20   [provider]  Cholecalciferol (VITAMIN D) 50 MCG (2000 UT) tablet Take 2,000 Units by mouth daily.    [provider]  Cyanocobalamin (B-12) 500 MCG TABS Take 1,000 mcg by mouth daily.    [provider]  fexofenadine (ALLEGRA) 180 MG tablet Take 180 mg by mouth daily as needed for  allergies. 08/05/04   [provider]  flunisolide (NASALIDE) 25 MCG/ACT (0.025%) SOLN Place 2 sprays into the nose 2 (two) times daily.    [provider]  hydrALAZINE (APRESOLINE) 25 MG tablet Take 25 mg by mouth 3 (three) times daily.    [provider]  lisinopril (ZESTRIL) 40 MG tablet Take 40 mg by mouth daily.    [provider]  metoprolol tartrate (LOPRESSOR) 25 MG tablet Take 2 tablets (50 mg total) by mouth 2 (two) times daily. Patient not taking: Reported on 12/03/2021 05/26/21   Mancel Bale, MD  metoprolol tartrate (LOPRESSOR) 50 MG tablet Take 50 mg by mouth 2 (two) times daily.    [provider]  mycophenolate (MYFORTIC) 180 MG EC tablet Take 180 mg by mouth 2 (two) times daily.    [provider]  pantoprazole (PROTONIX) 40 MG tablet Take 40 mg by mouth daily before supper.    [provider]  prednisoLONE acetate (PRED FORTE) 1 % ophthalmic suspension Place 2 drops into both eyes daily.    [provider]  predniSONE (DELTASONE) 5 MG tablet Take 5 mg by mouth every evening.    [provider]  tacrolimus (PROGRAF) 0.5 MG capsule Take 0.5 mg by mouth See admin instructions. Take 0.5 mg daily in the morning with the 1 mg capsule to equal 1.5 mg in the morning    [provider]  tacrolimus (  PROGRAF) 1 MG capsule Take 1 mg by mouth See admin instructions. Take 1 mg in the morning (take with 0.5 mg to equal 1.5 mg in the morning) and 2 mg in the evening    [provider]  Tiotropium Bromide-Olodaterol (STIOLTO RESPIMAT) 2.5-2.5 MCG/ACT AERS Inhale 2 each into the lungs daily.    [provider]      Allergies    Patient has no known allergies.    Review of Systems   Review of Systems  Constitutional:  Negative for fever.  Eyes:  Negative for visual disturbance.  Respiratory:  Positive for cough, sputum production and shortness of breath. Negative for hemoptysis and  wheezing.   Cardiovascular:  Negative for chest pain.  Gastrointestinal:  Negative for abdominal pain.    Physical Exam Updated Vital Signs BP (!) 112/95   Pulse (!) 155   Temp 98.3 F (36.8 C) (Oral)   Resp (!) 30   SpO2 91%  Physical Exam Vitals and nursing note reviewed.  Constitutional:      General: He is not in acute distress.    Appearance: He is well-developed.  HENT:     Head: Normocephalic and atraumatic.  Eyes:     Conjunctiva/sclera: Conjunctivae normal.  Cardiovascular:     Rate and Rhythm: Tachycardia present. Rhythm irregular.     Heart sounds: No murmur heard. Pulmonary:     Effort: Pulmonary effort is normal. No respiratory distress.     Breath sounds: Normal breath sounds.  Abdominal:     Palpations: Abdomen is soft.     Tenderness: There is no abdominal tenderness. There is no guarding or rebound.  Musculoskeletal:        General: No swelling.     Cervical back: Neck supple.     Right lower leg: No tenderness. No edema.     Left lower leg: No tenderness. No edema.  Skin:    General: Skin is warm and dry.     Capillary Refill: Capillary refill takes less than 2 seconds.  Neurological:     General: No focal deficit present.     Mental Status: He is alert.     ED Results / Procedures / Treatments   Labs (all labs ordered are listed, but only abnormal results are displayed) Labs Reviewed  BASIC METABOLIC PANEL - Abnormal; Notable for the following components:      Result Value   Sodium 132 (*)    Chloride 97 (*)    Glucose, Bld 190 (*)    BUN 41 (*)    Creatinine, Ser 1.52 (*)    GFR, Estimated 51 (*)    All other components within normal limits  BRAIN NATRIURETIC PEPTIDE - Abnormal; Notable for the following components:   B Natriuretic Peptide 1,240.0 (*)    All other components within normal limits  RESP PANEL BY RT-PCR (RSV, FLU A&B, COVID)  RVPGX2  RESPIRATORY PANEL BY PCR  MRSA NEXT GEN BY PCR, NASAL  MAGNESIUM  CBC  HIV ANTIBODY  (ROUTINE TESTING W REFLEX)  HEMOGLOBIN A1C  PROCALCITONIN    EKG EKG Interpretation  Date/Time:  Friday August 12 2022 11:04:49 EDT Ventricular Rate:  149 PR Interval:    QRS Duration: 134 QT Interval:  328 QTC Calculation: 516 R Axis:   109 Text Interpretation: Undetermined rhythm Right bundle branch block Abnormal ECG When compared with ECG of 26-May-2021 15:05, wide complex tachycardia Confirmed by Meridee Score [phone]) on 08/12/2022 11:08:41 AM  Radiology CT CHEST  WO CONTRAST  Result Date: 08/12/2022 CLINICAL DATA:  Dyspnea.  Chronic cough.  COPD. EXAM: CT CHEST WITHOUT CONTRAST TECHNIQUE: Multidetector CT imaging of the chest was performed following the standard protocol without IV contrast. RADIATION DOSE REDUCTION: This exam was performed according to the departmental dose-optimization program which includes automated exposure control, adjustment of the mA and/or kV according to patient size and/or use of iterative reconstruction technique. COMPARISON:  X-ray 08/12/2022 and older.  CT angiogram 05/17/2021 FINDINGS: Cardiovascular: Trace pericardial fluid. Heart is nonenlarged. Coronary artery calcifications are seen. There also calcifications along the area of the aortic valve and mitral valve annulus. The thoracic aorta on this noncontrast exam has a normal course and caliber with prominent calcified plaque including along the origin of the great vessels, in particular the left subclavian artery. Mediastinum/Nodes: No specific abnormal lymph node enlargement identified in the axillary region or hilum. There are some prominent to mildly enlarged mediastinal nodes however for example right paratracheal posteriorly along the upper mediastinum on series 2, image 28 measuring 17 by 9 mm, precarinal on series 2, image 65 measuring 2.2 by 1.1 cm. These are unchanged from the previous examination. Other nodes are present as well left paratracheal, subcarinal. There are some lymph nodes in the  mediastinum and hila with calcifications. Lungs/Pleura: Centrilobular emphysematous lung changes are identified. There are calcified lung nodules bilaterally consistent with old granulomatous disease. No pneumothorax or effusion. There is some ill-defined reticular parenchymal opacities identified in the right lower lobe, middle lobe and lingula. Areas in the right lower lobe and lingula were seen on the previous examination could be more chronic. There are some associated areas of bronchial wall thickening. The dependent ill-defined areas in the middle lobe are new from the examination of 2023. There is a noncalcified nodule measuring 4 mm in the inferior aspect of the right upper lobe on series 4, image 73 which is stable on the prior and by report been stable since 2018 demonstrating long-term stability. Upper Abdomen: Along the upper abdomen the kidneys are severely atrophic. Slight thickening of the left adrenal gland is stable. Musculoskeletal: Mild degenerative changes are seen along the spine. IMPRESSION: Advanced emphysematous lung changes. There is some ill-defined opacity and reticular changes involving the right lower lobe greater than middle lobe and lingula. The areas in the right lower lobe and lingula were seen on the study of 2023. This could be chronic versus a recurrent acute process. The areas in the middle lobe were not seen at the time. Again an acute process is possible and recommend follow-up to confirm clearance. Evidence of old granulomatous disease. Stable mildly enlarged mediastinal lymph nodes Aortic Atherosclerosis (ICD10-I70.0) and Emphysema (ICD10-J43.9). Electronically Signed   By: Karen Kays M.D.   On: 08/12/2022 15:50   DG Chest Port 1 View  Result Date: 08/12/2022 CLINICAL DATA:  Shortness of breath EXAM: PORTABLE CHEST 1 VIEW COMPARISON:  Radiograph 05/26/2021, 05/17/2021 FINDINGS: Unchanged cardiomediastinal silhouette. Emphysema with prominent interstitial opacities and  peripheral reticulation, similar to previous exams. No new focal airspace disease. No large effusion or evidence of pneumothorax. IMPRESSION: Emphysema with chronic interstitial changes. No new focal airspace disease. Electronically Signed   By: Caprice Renshaw M.D.   On: 08/12/2022 11:51    Procedures .Critical Care  Performed by: Terrilee Files, MD Authorized by: Terrilee Files, MD   Critical care provider statement:    Critical care time (minutes):  45   Critical care time was exclusive of:  Separately billable procedures  and treating other patients   Critical care was necessary to treat or prevent imminent or life-threatening deterioration of the following conditions:  Circulatory failure and cardiac failure   Critical care was time spent personally by me on the following activities:  Development of treatment plan with patient or surrogate, discussions with consultants, evaluation of patient's response to treatment, examination of patient, obtaining history from patient or surrogate, ordering and performing treatments and interventions, ordering and review of laboratory studies, ordering and review of radiographic studies, pulse oximetry, re-evaluation of patient's condition and review of old charts   I assumed direction of critical care for this patient from another provider in my specialty: no       Medications Ordered in ED Medications  atorvastatin (LIPITOR) tablet 40 mg (has no administration in time range)  metoprolol tartrate (LOPRESSOR) tablet 50 mg (has no administration in time range)  pantoprazole (PROTONIX) EC tablet 40 mg (40 mg Oral Given 08/12/22 1626)  apixaban (ELIQUIS) tablet 5 mg (has no administration in time range)  cyanocobalamin (VITAMIN B12) tablet 1,000 mcg (has no administration in time range)  tacrolimus (PROGRAF) capsule 0.5 mg (has no administration in time range)  tacrolimus (PROGRAF) capsule 1 mg (has no administration in time range)  acetaminophen  (TYLENOL) tablet 650 mg (has no administration in time range)    Or  acetaminophen (TYLENOL) suppository 650 mg (has no administration in time range)  ondansetron (ZOFRAN) tablet 4 mg (has no administration in time range)    Or  ondansetron (ZOFRAN) injection 4 mg (has no administration in time range)  doxycycline (VIBRA-TABS) tablet 100 mg (100 mg Oral Given 08/12/22 1626)  arformoterol (BROVANA) nebulizer solution 15 mcg (has no administration in time range)  ipratropium-albuterol (DUONEB) 0.5-2.5 (3) MG/3ML nebulizer solution 3 mL (3 mLs Nebulization Given 08/12/22 1427)  budesonide (PULMICORT) nebulizer solution 0.5 mg (0.5 mg Nebulization Given 08/12/22 1423)  methylPREDNISolone sodium succinate (SOLU-MEDROL) 40 mg/mL injection 40 mg (has no administration in time range)  metoprolol tartrate (LOPRESSOR) tablet 50 mg (50 mg Oral Given 08/12/22 1626)  Chlorhexidine Gluconate Cloth 2 % PADS 6 each (6 each Topical Given 08/12/22 1553)  guaiFENesin-dextromethorphan (ROBITUSSIN DM) 100-10 MG/5ML syrup 5 mL (5 mLs Oral Given 08/12/22 1626)  diltiazem (CARDIZEM) injection 10 mg (10 mg Intravenous Given 08/12/22 1209)  methylPREDNISolone sodium succinate (SOLU-MEDROL) 125 mg/2 mL injection 125 mg (125 mg Intravenous Given 08/12/22 1326)  metoprolol tartrate (LOPRESSOR) injection 5 mg (5 mg Intravenous Given 08/12/22 1425)  furosemide (LASIX) injection 40 mg (40 mg Intravenous Given 08/12/22 1424)    ED Course/ Medical Decision Making/ A&P Clinical Course as of 08/12/22 1654  Fri Aug 12, 2022  1136 Test x-ray showing diffuse reticulonodular pattern.  No clear infiltrate.  Awaiting radiology reading. [MB]  1159 Discussed with Dr. Wyline Mood cardiology.  He felt the patient was in A-fib with aberrant bundle branch block.  He is recommending Cardizem 10 IV and if that helps but does not hold him he may need to drip and admission.  He will see in consult. [MB]  1249 Patient's heart rate dropped into the 110s with  IV Cardizem.  Discussed with Dr. Arbutus Leas [facility] hospitalist who will evaluate for admission [MB]    Clinical Course User Index [MB] Terrilee Files, MD                             Medical Decision Making Amount and/or Complexity of Data Reviewed  Labs: ordered. Radiology: ordered.  Risk Prescription drug management. Decision regarding hospitalization.   This patient complains of shortness of breath weak lightheaded low blood pressure; this involves an extensive number of treatment Options and is a complaint that carries with it a high risk of complications and morbidity. The differential includes arrhythmia, CHF, COPD, hypoxia, metabolic derangement  I ordered, reviewed and interpreted labs, which included CBC with normal white count and hemoglobin, chemistries with worsening CKD, BNP elevated, COVID and flu negative I ordered medication IV Cardizem and reviewed PMP when indicated. I ordered imaging studies which included chest x-ray and I independently    visualized and interpreted imaging which showed COPD no definite infiltrate Previous records obtained and reviewed in epic including prior cardiology notes I consulted cardiology Dr. Wyline Mood and Dr. Arbutus Leas [facility] hospitalist and discussed lab and imaging findings and discussed disposition.  Cardiac monitoring reviewed, A-fib with RVR Social determinants considered, ongoing tobacco use Critical Interventions: Initiation of IV rate control for tachydysrhythmia  After the interventions stated above, I reevaluated the patient and found patient to be symptomatically improved and his heart rate slows Admission and further testing considered, would benefit from mission the hospital for continued diuresis and control of heart rate.  Patient in agreement with plan for admission.         Final Clinical Impression(s) / ED Diagnoses Final diagnoses:  Atrial fibrillation with rapid ventricular response  Shortness of breath    Rx / DC  Orders ED Discharge Orders     None         Terrilee Files, MD 08/12/22 1657

## 2022-08-12 NOTE — ED Notes (Signed)
02 2L [place] applied due to sats 91-92 and to see if help with his mild sob. Nad. Hospitalist at bedside.

## 2022-08-13 DIAGNOSIS — J439 Emphysema, unspecified: Secondary | ICD-10-CM | POA: Diagnosis present

## 2022-08-13 DIAGNOSIS — N179 Acute kidney failure, unspecified: Secondary | ICD-10-CM | POA: Diagnosis not present

## 2022-08-13 DIAGNOSIS — I130 Hypertensive heart and chronic kidney disease with heart failure and stage 1 through stage 4 chronic kidney disease, or unspecified chronic kidney disease: Secondary | ICD-10-CM | POA: Diagnosis present

## 2022-08-13 DIAGNOSIS — J449 Chronic obstructive pulmonary disease, unspecified: Secondary | ICD-10-CM | POA: Diagnosis present

## 2022-08-13 DIAGNOSIS — I5033 Acute on chronic diastolic (congestive) heart failure: Secondary | ICD-10-CM | POA: Diagnosis present

## 2022-08-13 DIAGNOSIS — I10 Essential (primary) hypertension: Secondary | ICD-10-CM | POA: Diagnosis not present

## 2022-08-13 DIAGNOSIS — E785 Hyperlipidemia, unspecified: Secondary | ICD-10-CM | POA: Diagnosis present

## 2022-08-13 DIAGNOSIS — Z1152 Encounter for screening for COVID-19: Secondary | ICD-10-CM | POA: Diagnosis not present

## 2022-08-13 DIAGNOSIS — I4891 Unspecified atrial fibrillation: Secondary | ICD-10-CM | POA: Diagnosis not present

## 2022-08-13 DIAGNOSIS — I4510 Unspecified right bundle-branch block: Secondary | ICD-10-CM | POA: Diagnosis present

## 2022-08-13 DIAGNOSIS — I4892 Unspecified atrial flutter: Secondary | ICD-10-CM | POA: Diagnosis not present

## 2022-08-13 DIAGNOSIS — J440 Chronic obstructive pulmonary disease with acute lower respiratory infection: Secondary | ICD-10-CM | POA: Diagnosis present

## 2022-08-13 DIAGNOSIS — J441 Chronic obstructive pulmonary disease with (acute) exacerbation: Secondary | ICD-10-CM | POA: Diagnosis present

## 2022-08-13 DIAGNOSIS — I2510 Atherosclerotic heart disease of native coronary artery without angina pectoris: Secondary | ICD-10-CM | POA: Diagnosis present

## 2022-08-13 DIAGNOSIS — E871 Hypo-osmolality and hyponatremia: Secondary | ICD-10-CM | POA: Diagnosis present

## 2022-08-13 DIAGNOSIS — J181 Lobar pneumonia, unspecified organism: Secondary | ICD-10-CM | POA: Diagnosis present

## 2022-08-13 DIAGNOSIS — I2489 Other forms of acute ischemic heart disease: Secondary | ICD-10-CM | POA: Diagnosis present

## 2022-08-13 DIAGNOSIS — N1831 Chronic kidney disease, stage 3a: Secondary | ICD-10-CM | POA: Diagnosis present

## 2022-08-13 DIAGNOSIS — R000 Tachycardia, unspecified: Secondary | ICD-10-CM | POA: Diagnosis not present

## 2022-08-13 DIAGNOSIS — I350 Nonrheumatic aortic (valve) stenosis: Secondary | ICD-10-CM | POA: Diagnosis present

## 2022-08-13 DIAGNOSIS — R0602 Shortness of breath: Secondary | ICD-10-CM | POA: Diagnosis present

## 2022-08-13 DIAGNOSIS — E860 Dehydration: Secondary | ICD-10-CM | POA: Diagnosis present

## 2022-08-13 DIAGNOSIS — J9601 Acute respiratory failure with hypoxia: Secondary | ICD-10-CM | POA: Diagnosis present

## 2022-08-13 DIAGNOSIS — Q2112 Patent foramen ovale: Secondary | ICD-10-CM | POA: Diagnosis not present

## 2022-08-13 DIAGNOSIS — I110 Hypertensive heart disease with heart failure: Secondary | ICD-10-CM | POA: Diagnosis not present

## 2022-08-13 DIAGNOSIS — T8619 Other complication of kidney transplant: Secondary | ICD-10-CM | POA: Diagnosis present

## 2022-08-13 DIAGNOSIS — I252 Old myocardial infarction: Secondary | ICD-10-CM | POA: Diagnosis not present

## 2022-08-13 DIAGNOSIS — F17210 Nicotine dependence, cigarettes, uncomplicated: Secondary | ICD-10-CM | POA: Diagnosis present

## 2022-08-13 DIAGNOSIS — I480 Paroxysmal atrial fibrillation: Secondary | ICD-10-CM | POA: Diagnosis present

## 2022-08-13 DIAGNOSIS — I484 Atypical atrial flutter: Secondary | ICD-10-CM | POA: Diagnosis present

## 2022-08-13 LAB — MAGNESIUM: Magnesium: 1.9 mg/dL (ref 1.7–2.4)

## 2022-08-13 LAB — CBC
HCT: 35.8 % — ABNORMAL LOW (ref 39.0–52.0)
Hemoglobin: 12.2 g/dL — ABNORMAL LOW (ref 13.0–17.0)
MCH: 31.9 pg (ref 26.0–34.0)
MCHC: 34.1 g/dL (ref 30.0–36.0)
MCV: 93.7 fL (ref 80.0–100.0)
Platelets: 213 10*3/uL (ref 150–400)
RBC: 3.82 MIL/uL — ABNORMAL LOW (ref 4.22–5.81)
RDW: 14.4 % (ref 11.5–15.5)
WBC: 9.3 10*3/uL (ref 4.0–10.5)
nRBC: 0 % (ref 0.0–0.2)

## 2022-08-13 LAB — BASIC METABOLIC PANEL
Anion gap: 12 (ref 5–15)
BUN: 46 mg/dL — ABNORMAL HIGH (ref 8–23)
CO2: 21 mmol/L — ABNORMAL LOW (ref 22–32)
Calcium: 8.8 mg/dL — ABNORMAL LOW (ref 8.9–10.3)
Chloride: 97 mmol/L — ABNORMAL LOW (ref 98–111)
Creatinine, Ser: 1.44 mg/dL — ABNORMAL HIGH (ref 0.61–1.24)
GFR, Estimated: 54 mL/min — ABNORMAL LOW (ref >60)
Glucose, Bld: 236 mg/dL — ABNORMAL HIGH (ref 70–99)
Potassium: 4.6 mmol/L (ref 3.5–5.1)
Sodium: 130 mmol/L — ABNORMAL LOW (ref 135–145)

## 2022-08-13 MED ORDER — SODIUM CHLORIDE 0.9 % IV SOLN
1.0000 g | INTRAVENOUS | Status: AC
  Administered 2022-08-13 – 2022-08-18 (×5): 1 g via INTRAVENOUS
  Filled 2022-08-13 (×5): qty 10

## 2022-08-13 NOTE — Progress Notes (Addendum)
PROGRESS NOTE  DEKKER VERGA ZOX:[id number] DOB: 05/29/1955 DOA: 08/12/2022 PCP: [facility]  Brief History:  67 year old male with a history of renal transplant x 2 in 2003 and 2011, COPD, tobacco abuse, coronary artery disease with history of MI, atrial fibrillation, and hyperlipidemia presenting with 2-week history of shortness of breath, coughing, and chest congestion.  He states that he has been coughing up some yellow sputum, but denies any hemoptysis.  He has had some subjective sweats but has not taken his temperature.  Notably, the patient has a portable heart monitor that he has been using and has noted that he has had atrial fibrillation with RVR in the past 4 days.  He states that his blood pressure has been running soft with systolic BP in the low 100s.  He denies any syncope, but feels tired and short of breath.  He states that he did have some chest discomfort when he was having RVR with his atrial fibrillation.  At present, the patient denies any chest discomfort, nausea, vomiting or direct abdominal pain.  He continues to smoke slightly over 1 pack/day.  He states that he last picked up a cigarette on 08/08/2022.  Because of his atrial fibrillation and rapid heart rate he presented for further evaluation and treatment.  He denies any worsening lower extremity edema or orthopnea. In the ED, the patient was afebrile hemodynamically stable with oxygen saturation 90% on room air.  WBC 10.5, hemoglobin 14.0, platelets 199,000.  Sodium 132, potassium 4.1, bicarbonate 24, serum creatinine 1.52.  COVID-19 PCR was negative.  BNP 1240.  Chest x-ray showed chronic interstitial markings and hyperinflation.  EKG showed atrial flutter with right bundle branch block.  The patient was started on diltiazem.  Cardiology was consulted to assist with management.   Assessment/Plan: Acute respiratory failure with hypoxia -Secondary COPD exacerbation -Initially placed on 2 L nasal  cannula -Presented with hypoxia and tachypnea -Wean oxygen for saturation greater 92% -08/02/2022 CT chest--advanced emphysema; ill-defined opacity and reticular nodular change in the RLL>RML; RML opacities are new; mildly enlarged mediastinal lymph nodes   COPD exacerbation -continue IV Solu-Medrol -continue Pulmicort -Continue on duonebs -Continue Brovana -COVID/Flu/RSV neg -viral resp panel--neg  Lobar pneumonia --08/02/2022 CT chest--advanced emphysema; ill-defined opacity and reticular nodular change in the RLL>RML; RML opacities are new; mildly enlarged mediastinal lymph nodes -Add ceftriaxone -Continue doxycycline   Atrial fibrillation with RVR, type unspecified -pt states he is due to ablation on 11/07/22 -appreciate cardiology consult -restart metoprolol--increased to 3 times daily -continue diltiazem gtt -continue apixaban   Renal transplant recipient -continue tacrolimus and azathioprine -no longer taking mycophenolate -on IV solumedrol so hold prednisone   Tobacco abuse -tobacco cessation discussed   Hyperlipidemia -continue statin   CAD/elevated troponin -Secondary to demand ischemia -no chest pain presently -continue statin, metoprolol, apixaban           Family Communication:   Significant other at bedside 4/20  Consultants: Cardiology  Code Status:  FULL   DVT Prophylaxis: Apixaban   Procedures: As Listed in Progress Note Above  Antibiotics: Ceftriaxone 4/20>> Doxycycline 4/19>>     Subjective: Patient states that he is breathing a little better today.  He denies any chest pain, hemoptysis, nausea, vomiting, direct abdominal pain.  His appetite is poor.  Objective: Vitals:   08/13/22 1030 08/13/22 1100 08/13/22 1130 08/13/22 1136  BP: 129/71 125/82 134/70   Pulse: 86 (!) 46 (!) 50 71  Resp:   [name] Kitchen)  28   Temp:    98.3 F (36.8 C)  TempSrc:    Oral  SpO2: (!) 88% 90% 91% 92%  Weight:      Height:        Intake/Output Summary  (Last 24 hours) at 08/13/2022 1238 Last data filed at 08/13/2022 1151 Gross per 24 hour  Intake 119.99 ml  Output 700 ml  Net -580.01 ml   Weight change:  Exam:  General:  Pt is alert, follows commands appropriately, not in acute distress HEENT: No icterus, No thrush, No neck mass, [place]/AT Cardiovascular: IRRR, S1/S2, no rubs, no gallops Respiratory: Bibasilar rales, right greater than left.  Minimal basilar wheeze.  Good air movement Abdomen: Soft/+BS, non tender, non distended, no guarding Extremities: No edema, No lymphangitis, No petechiae, No rashes, no synovitis   Data Reviewed: I have personally reviewed following labs and imaging studies Basic Metabolic Panel: Recent Labs  Lab 08/12/22 1124  NA 132*  K 4.1  CL 97*  CO2 24  GLUCOSE 190*  BUN 41*  CREATININE 1.52*  CALCIUM 9.0  MG 1.9   Liver Function Tests: No results for input(s): "AST", "ALT", "ALKPHOS", "BILITOT", "PROT", "ALBUMIN" in the last 168 hours. No results for input(s): "LIPASE", "AMYLASE" in the last 168 hours. No results for input(s): "AMMONIA" in the last 168 hours. Coagulation Profile: No results for input(s): "INR", "PROTIME" in the last 168 hours. CBC: Recent Labs  Lab 08/12/22 1124  WBC 10.5  HGB 14.0  HCT 41.5  MCV 95.0  PLT 199   Cardiac Enzymes: No results for input(s): "CKTOTAL", "CKMB", "CKMBINDEX", "TROPONINI" in the last 168 hours. BNP: Invalid input(s): "POCBNP" CBG: No results for input(s): "GLUCAP" in the last 168 hours. HbA1C: Recent Labs    08/12/22 1528  HGBA1C 5.4   Urine analysis:    Component Value Date/Time   COLORURINE AMBER (A) 12/03/2016 1026   APPEARANCEUR CLEAR 12/03/2016 1026   LABSPEC 1.023 12/03/2016 1026   PHURINE 5.0 12/03/2016 1026   GLUCOSEU NEGATIVE 12/03/2016 1026   HGBUR SMALL (A) 12/03/2016 1026   BILIRUBINUR NEGATIVE 12/03/2016 1026   KETONESUR 5 (A) 12/03/2016 1026   PROTEINUR >=300 (A) 12/03/2016 1026   NITRITE NEGATIVE 12/03/2016 1026    LEUKOCYTESUR NEGATIVE 12/03/2016 1026   Sepsis Labs: @LABRCNTIP (procalcitonin:4,lacticidven:4) ) Recent Results (from the past 240 hour(s))  Resp panel by RT-PCR (RSV, Flu A&B, Covid) Anterior Nasal Swab     Status: None   Collection Time: 08/12/22 11:34 AM   Specimen: Anterior Nasal Swab  Result Value Ref Range Status   SARS Coronavirus 2 by RT PCR NEGATIVE NEGATIVE Final    Comment: (NOTE) SARS-CoV-2 target nucleic acids are NOT DETECTED.  The SARS-CoV-2 RNA is generally detectable in upper respiratory specimens during the acute phase of infection. The lowest concentration of SARS-CoV-2 viral copies this assay can detect is 138 copies/mL. A negative result does not preclude SARS-Cov-2 infection and should not be used as the sole basis for treatment or other patient management decisions. A negative result may occur with  improper specimen collection/handling, submission of specimen other than nasopharyngeal swab, presence of viral mutation(s) within the areas targeted by this assay, and inadequate number of viral copies(<138 copies/mL). A negative result must be combined with clinical observations, patient history, and epidemiological information. The expected result is Negative.  Fact Sheet for Patients:  BloggerCourse.com  Fact Sheet for Healthcare Providers:  SeriousBroker.it  This test is no t yet approved or cleared by the [place] FDA  and  has been authorized for detection and/or diagnosis of SARS-CoV-2 by FDA under an Emergency Use Authorization (EUA). This EUA will remain  in effect (meaning this test can be used) for the duration of the COVID-19 declaration under Section 564(b)(1) of the Act, 21 U.S.C.section 360bbb-3(b)(1), unless the authorization is terminated  or revoked sooner.       Influenza A by PCR NEGATIVE NEGATIVE Final   Influenza B by PCR NEGATIVE NEGATIVE Final    Comment: (NOTE) The Xpert  Xpress SARS-CoV-2/FLU/RSV plus assay is intended as an aid in the diagnosis of influenza from Nasopharyngeal swab specimens and should not be used as a sole basis for treatment. Nasal washings and aspirates are unacceptable for Xpert Xpress SARS-CoV-2/FLU/RSV testing.  Fact Sheet for Patients: BloggerCourse.com  Fact Sheet for Healthcare Providers: SeriousBroker.it  This test is not yet approved or cleared by the [place] FDA and has been authorized for detection and/or diagnosis of SARS-CoV-2 by FDA under an Emergency Use Authorization (EUA). This EUA will remain in effect (meaning this test can be used) for the duration of the COVID-19 declaration under Section 564(b)(1) of the Act, 21 U.S.C. section 360bbb-3(b)(1), unless the authorization is terminated or revoked.     Resp Syncytial Virus by PCR NEGATIVE NEGATIVE Final    Comment: (NOTE) Fact Sheet for Patients: BloggerCourse.com  Fact Sheet for Healthcare Providers: SeriousBroker.it  This test is not yet approved or cleared by the [place] FDA and has been authorized for detection and/or diagnosis of SARS-CoV-2 by FDA under an Emergency Use Authorization (EUA). This EUA will remain in effect (meaning this test can be used) for the duration of the COVID-19 declaration under Section 564(b)(1) of the Act, 21 U.S.C. section 360bbb-3(b)(1), unless the authorization is terminated or revoked.  Performed at [facility], [address], [place], [place] [address]   Respiratory (~20 pathogens) panel by PCR     Status: None   Collection Time: 08/12/22  3:52 PM   Specimen: Nasopharyngeal Swab; Respiratory  Result Value Ref Range Status   Adenovirus NOT DETECTED NOT DETECTED Final   Coronavirus 229E NOT DETECTED NOT DETECTED Final    Comment: (NOTE) The Coronavirus on the Respiratory Panel, DOES NOT test for the novel   Coronavirus (2019 nCoV)    Coronavirus HKU1 NOT DETECTED NOT DETECTED Final   Coronavirus NL63 NOT DETECTED NOT DETECTED Final   Coronavirus OC43 NOT DETECTED NOT DETECTED Final   Metapneumovirus NOT DETECTED NOT DETECTED Final   Rhinovirus / Enterovirus NOT DETECTED NOT DETECTED Final   Influenza A NOT DETECTED NOT DETECTED Final   Influenza B NOT DETECTED NOT DETECTED Final   Parainfluenza Virus 1 NOT DETECTED NOT DETECTED Final   Parainfluenza Virus 2 NOT DETECTED NOT DETECTED Final   Parainfluenza Virus 3 NOT DETECTED NOT DETECTED Final   Parainfluenza Virus 4 NOT DETECTED NOT DETECTED Final   Respiratory Syncytial Virus NOT DETECTED NOT DETECTED Final   Bordetella pertussis NOT DETECTED NOT DETECTED Final   Bordetella Parapertussis NOT DETECTED NOT DETECTED Final   Chlamydophila pneumoniae NOT DETECTED NOT DETECTED Final   Mycoplasma pneumoniae NOT DETECTED NOT DETECTED Final    Comment: Performed at [facility], [address], [place], [place] [address]  MRSA Next Gen by PCR, Nasal     Status: None   Collection Time: 08/12/22  3:52 PM   Specimen: Nasal Mucosa; Nasal Swab  Result Value Ref Range Status   MRSA by PCR Next Gen NOT DETECTED  NOT DETECTED Final    Comment: (NOTE) The GeneXpert MRSA Assay (FDA approved for NASAL specimens only), is one component of a comprehensive MRSA colonization surveillance program. It is not intended to diagnose MRSA infection nor to guide or monitor treatment for MRSA infections. Test performance is not FDA approved in patients less than 41 years old. Performed at [facility], [address], [place], [place] [address]      Scheduled Meds:  apixaban  5 mg Oral BID   arformoterol  15 mcg Nebulization BID   atorvastatin  40 mg Oral QHS   budesonide (PULMICORT) nebulizer solution  0.5 mg Nebulization BID   Chlorhexidine Gluconate Cloth  6 each Topical Daily   cyanocobalamin  1,000 mcg Oral Daily   doxycycline  100 mg Oral Q12H    ipratropium-albuterol  3 mL Nebulization TID   methylPREDNISolone (SOLU-MEDROL) injection  40 mg Intravenous Q12H   metoprolol tartrate  50 mg Oral TID   pantoprazole  40 mg Oral QAC supper   tacrolimus  1.5 mg Oral Daily   And   tacrolimus  2 mg Oral QHS   Continuous Infusions:  diltiazem (CARDIZEM) infusion 10 mg/hr (08/13/22 1151)    Procedures/Studies: CT CHEST WO CONTRAST  Result Date: 08/12/2022 CLINICAL DATA:  Dyspnea.  Chronic cough.  COPD. EXAM: CT CHEST WITHOUT CONTRAST TECHNIQUE: Multidetector CT imaging of the chest was performed following the standard protocol without IV contrast. RADIATION DOSE REDUCTION: This exam was performed according to the departmental dose-optimization program which includes automated exposure control, adjustment of the mA and/or kV according to patient size and/or use of iterative reconstruction technique. COMPARISON:  X-ray 08/12/2022 and older.  CT angiogram 05/17/2021 FINDINGS: Cardiovascular: Trace pericardial fluid. Heart is nonenlarged. Coronary artery calcifications are seen. There also calcifications along the area of the aortic valve and mitral valve annulus. The thoracic aorta on this noncontrast exam has a normal course and caliber with prominent calcified plaque including along the origin of the great vessels, in particular the left subclavian artery. Mediastinum/Nodes: No specific abnormal lymph node enlargement identified in the axillary region or hilum. There are some prominent to mildly enlarged mediastinal nodes however for example right paratracheal posteriorly along the upper mediastinum on series 2, image 28 measuring 17 by 9 mm, precarinal on series 2, image 65 measuring 2.2 by 1.1 cm. These are unchanged from the previous examination. Other nodes are present as well left paratracheal, subcarinal. There are some lymph nodes in the mediastinum and hila with calcifications. Lungs/Pleura: Centrilobular emphysematous lung changes are identified.  There are calcified lung nodules bilaterally consistent with old granulomatous disease. No pneumothorax or effusion. There is some ill-defined reticular parenchymal opacities identified in the right lower lobe, middle lobe and lingula. Areas in the right lower lobe and lingula were seen on the previous examination could be more chronic. There are some associated areas of bronchial wall thickening. The dependent ill-defined areas in the middle lobe are new from the examination of 2023. There is a noncalcified nodule measuring 4 mm in the inferior aspect of the right upper lobe on series 4, image 73 which is stable on the prior and by report been stable since 2018 demonstrating long-term stability. Upper Abdomen: Along the upper abdomen the kidneys are severely atrophic. Slight thickening of the left adrenal gland is stable. Musculoskeletal: Mild degenerative changes are seen along the spine. IMPRESSION: Advanced emphysematous lung changes. There is some ill-defined opacity and reticular changes involving the right lower lobe greater than middle  lobe and lingula. The areas in the right lower lobe and lingula were seen on the study of 2023. This could be chronic versus a recurrent acute process. The areas in the middle lobe were not seen at the time. Again an acute process is possible and recommend follow-up to confirm clearance. Evidence of old granulomatous disease. Stable mildly enlarged mediastinal lymph nodes Aortic Atherosclerosis (ICD10-I70.0) and Emphysema (ICD10-J43.9). Electronically Signed   By: Karen Kays M.D.   On: 08/12/2022 15:50   DG Chest Port 1 View  Result Date: 08/12/2022 CLINICAL DATA:  Shortness of breath EXAM: PORTABLE CHEST 1 VIEW COMPARISON:  Radiograph 05/26/2021, 05/17/2021 FINDINGS: Unchanged cardiomediastinal silhouette. Emphysema with prominent interstitial opacities and peripheral reticulation, similar to previous exams. No new focal airspace disease. No large effusion or evidence of  pneumothorax. IMPRESSION: Emphysema with chronic interstitial changes. No new focal airspace disease. Electronically Signed   By: Caprice Renshaw M.D.   On: 08/12/2022 11:51    Catarina Hartshorn, DO  [facility]  If 7PM-7AM, please contact night-coverage [email or web address] Password [facility] 08/13/2022, 12:38 PM   LOS: 0 days

## 2022-08-13 NOTE — TOC Initial Note (Signed)
Transition of Care [facility]) - Initial/Assessment Note    Patient Details  Name: Calvin Carroll MRN: [id number] Date of Birth: [birth year]  Transition of Care [facility]) CM/SW Contact:    Catalina Gravel, LCSW Phone Number: 08/13/2022, 8:48 AM  Clinical Narrative:                 .. Transition of Care [facility]) Screening Note   Patient Details  Name: Calvin Carroll Date of Birth: May 30, 1955   Transition of Care [facility]) CM/SW Contact:    Catalina Gravel, LCSW Phone Number: 08/13/2022, 8:48 AM    Transition of Care Department [facility]) has reviewed patient and no TOC needs have been identified at this time. We will continue to monitor patient advancement through interdisciplinary progression rounds. If new patient transition needs arise, please place a TOC consult.   Addendum: [id number] [facility] Care [facility] Reported    Barriers to Discharge: Continued Medical Work up   Patient Goals and CMS Choice            Expected Discharge Plan and Services                                              Prior Living Arrangements/Services                       Activities of Daily Living Home Assistive Devices/Equipment: None ADL Screening (condition at time of admission) Patient's cognitive ability adequate to safely complete daily activities?: Yes Is the patient deaf or have difficulty hearing?: Yes Does the patient have difficulty seeing, even when wearing glasses/contacts?: Yes Does the patient have difficulty concentrating, remembering, or making decisions?: No Patient able to express need for assistance with ADLs?: Yes Does the patient have difficulty dressing or bathing?: No Independently performs ADLs?: Yes (appropriate for developmental age) Does the patient have difficulty walking or climbing stairs?: No Weakness of Legs: None Weakness of Arms/Hands: None  Permission Sought/Granted                  Emotional Assessment               Admission diagnosis:  Atrial fibrillation with RVR [I48.91] Patient Active Problem List   Diagnosis Date Noted   Acute respiratory failure with hypoxia 08/12/2022   Atrial fibrillation with RVR 05/18/2021   Atrial fibrillation with rapid ventricular response 05/17/2021   Renal transplant recipient 05/17/2021   HTN (hypertension) 05/17/2021   COPD with acute exacerbation 05/17/2021   COVID-19 virus infection 05/17/2021   PCP:  [facility] Pharmacy:   [facility] - [place], [place] - [address] [place] [address] Phone: [phone] Fax: [phone]     Social Determinants of Health (SDOH) Social History: SDOH Screenings   Food Insecurity: No Food Insecurity (08/12/2022)  Housing: Low Risk  (08/12/2022)  Transportation Needs: No Transportation Needs (08/12/2022)  Utilities: Not At Risk (08/12/2022)  Tobacco Use: High Risk (08/12/2022)   SDOH Interventions:     Readmission Risk Interventions     No data to display

## 2022-08-14 ENCOUNTER — Inpatient Hospital Stay (HOSPITAL_COMMUNITY): Payer: No Typology Code available for payment source

## 2022-08-14 DIAGNOSIS — I4891 Unspecified atrial fibrillation: Secondary | ICD-10-CM | POA: Diagnosis not present

## 2022-08-14 DIAGNOSIS — R0602 Shortness of breath: Secondary | ICD-10-CM

## 2022-08-14 DIAGNOSIS — J441 Chronic obstructive pulmonary disease with (acute) exacerbation: Secondary | ICD-10-CM | POA: Diagnosis not present

## 2022-08-14 DIAGNOSIS — J9601 Acute respiratory failure with hypoxia: Secondary | ICD-10-CM | POA: Diagnosis not present

## 2022-08-14 LAB — BASIC METABOLIC PANEL
Anion gap: 9 (ref 5–15)
BUN: 49 mg/dL — ABNORMAL HIGH (ref 8–23)
CO2: 23 mmol/L (ref 22–32)
Calcium: 9 mg/dL (ref 8.9–10.3)
Chloride: 96 mmol/L — ABNORMAL LOW (ref 98–111)
Creatinine, Ser: 1.36 mg/dL — ABNORMAL HIGH (ref 0.61–1.24)
GFR, Estimated: 58 mL/min — ABNORMAL LOW (ref >60)
Glucose, Bld: 194 mg/dL — ABNORMAL HIGH (ref 70–99)
Potassium: 4.9 mmol/L (ref 3.5–5.1)
Sodium: 128 mmol/L — ABNORMAL LOW (ref 135–145)

## 2022-08-14 LAB — CBC
HCT: 35.2 % — ABNORMAL LOW (ref 39.0–52.0)
Hemoglobin: 11.8 g/dL — ABNORMAL LOW (ref 13.0–17.0)
MCH: 31.7 pg (ref 26.0–34.0)
MCHC: 33.5 g/dL (ref 30.0–36.0)
MCV: 94.6 fL (ref 80.0–100.0)
Platelets: 221 10*3/uL (ref 150–400)
RBC: 3.72 MIL/uL — ABNORMAL LOW (ref 4.22–5.81)
RDW: 14.1 % (ref 11.5–15.5)
WBC: 10.7 10*3/uL — ABNORMAL HIGH (ref 4.0–10.5)
nRBC: 0 % (ref 0.0–0.2)

## 2022-08-14 LAB — BRAIN NATRIURETIC PEPTIDE: B Natriuretic Peptide: 678 pg/mL — ABNORMAL HIGH (ref 0.0–100.0)

## 2022-08-14 LAB — PROCALCITONIN: Procalcitonin: 0.13 ng/mL

## 2022-08-14 LAB — MAGNESIUM: Magnesium: 1.9 mg/dL (ref 1.7–2.4)

## 2022-08-14 MED ORDER — ALBUTEROL SULFATE (2.5 MG/3ML) 0.083% IN NEBU
2.5000 mg | INHALATION_SOLUTION | Freq: Three times a day (TID) | RESPIRATORY_TRACT | Status: DC
  Administered 2022-08-14 (×2): 2.5 mg via RESPIRATORY_TRACT
  Filled 2022-08-14 (×2): qty 3

## 2022-08-14 MED ORDER — HYDROXYZINE HCL 25 MG PO TABS
25.0000 mg | ORAL_TABLET | Freq: Three times a day (TID) | ORAL | Status: DC | PRN
  Administered 2022-08-14 – 2022-08-18 (×5): 25 mg via ORAL
  Filled 2022-08-14 (×6): qty 1

## 2022-08-14 MED ORDER — ALBUTEROL SULFATE (2.5 MG/3ML) 0.083% IN NEBU
2.5000 mg | INHALATION_SOLUTION | Freq: Three times a day (TID) | RESPIRATORY_TRACT | Status: DC
  Administered 2022-08-15 – 2022-08-17 (×8): 2.5 mg via RESPIRATORY_TRACT
  Filled 2022-08-14 (×9): qty 3

## 2022-08-14 MED ORDER — AZATHIOPRINE 50 MG PO TABS
50.0000 mg | ORAL_TABLET | Freq: Every day | ORAL | Status: DC
  Administered 2022-08-14 – 2022-08-19 (×6): 50 mg via ORAL
  Filled 2022-08-14 (×9): qty 1

## 2022-08-14 MED ORDER — REVEFENACIN 175 MCG/3ML IN SOLN
175.0000 ug | Freq: Every day | RESPIRATORY_TRACT | Status: DC
  Administered 2022-08-14 – 2022-08-19 (×6): 175 ug via RESPIRATORY_TRACT
  Filled 2022-08-14 (×6): qty 3

## 2022-08-14 MED ORDER — PHENOL 1.4 % MT LIQD: 1.0000 | OROMUCOSAL | Status: DC | PRN

## 2022-08-14 NOTE — Progress Notes (Signed)
PROGRESS NOTE  Calvin Carroll:[id number] DOB: 01/22/1956 DOA: 08/12/2022 PCP: [facility]  Brief History:  67 year old male with a history of renal transplant x 2 in 2003 and 2011, COPD, tobacco abuse, coronary artery disease with history of MI, atrial fibrillation, and hyperlipidemia presenting with 2-week history of shortness of breath, coughing, and chest congestion.  He states that he has been coughing up some yellow sputum, but denies any hemoptysis.  He has had some subjective sweats but has not taken his temperature.  Notably, the patient has a portable heart monitor that he has been using and has noted that he has had atrial fibrillation with RVR in the past 4 days.  He states that his blood pressure has been running soft with systolic BP in the low 100s.  He denies any syncope, but feels tired and short of breath.  He states that he did have some chest discomfort when he was having RVR with his atrial fibrillation.  At present, the patient denies any chest discomfort, nausea, vomiting or direct abdominal pain.  He continues to smoke slightly over 1 pack/day.  He states that he last picked up a cigarette on 08/08/2022.  Because of his atrial fibrillation and rapid heart rate he presented for further evaluation and treatment.  He denies any worsening lower extremity edema or orthopnea. In the ED, the patient was afebrile hemodynamically stable with oxygen saturation 90% on room air.  WBC 10.5, hemoglobin 14.0, platelets 199,000.  Sodium 132, potassium 4.1, bicarbonate 24, serum creatinine 1.52.  COVID-19 PCR was negative.  BNP 1240.  Chest x-ray showed chronic interstitial markings and hyperinflation.  EKG showed atrial flutter with right bundle branch block.  The patient was started on diltiazem.  Cardiology was consulted to assist with management.   Assessment/Plan: Acute respiratory failure with hypoxia -Secondary COPD exacerbation -Initially placed on 2 L nasal  cannula -Presented with hypoxia and tachypnea -Wean oxygen for saturation greater 92% -08/02/2022 CT chest--advanced emphysema; ill-defined opacity and reticular nodular change in the RLL>RML; RML opacities are new; mildly enlarged mediastinal lymph nodes -obtain ReDS reading -echo   COPD exacerbation -continue IV Solu-Medrol -continue Pulmicort -Continue on duonebs -Continue Brovana -COVID/Flu/RSV neg -viral resp panel--neg -start yupelri   Lobar pneumonia --08/02/2022 CT chest--advanced emphysema; ill-defined opacity and reticular nodular change in the RLL>RML; RML opacities are new; mildly enlarged mediastinal lymph nodes -Added ceftriaxone -Continue doxycycline -PCT 0.26>>0.13   Atrial fibrillation with RVR, type unspecified -pt states he is due to ablation on 11/07/22 -appreciate cardiology consult -restart metoprolol--increased to 3 times daily -continue diltiazem gtt>>now rate controlled -continue apixaban -obtain echo   Renal transplant recipient -continue tacrolimus and azathioprine -no longer taking mycophenolate -on IV solumedrol so hold prednisone   Tobacco abuse -tobacco cessation discussed   Hyperlipidemia -continue statin   CAD/elevated troponin -Secondary to demand ischemia -no chest pain presently -continue statin, metoprolol, apixaban                   Family Communication:   Significant other at bedside 4/20   Consultants: Cardiology   Code Status:  FULL    DVT Prophylaxis: Apixaban     Procedures: As Listed in Progress Note Above   Antibiotics: Ceftriaxone 4/20>> Doxycycline 4/19>>                Subjective: Pt complains of dyspnea on exertion.  Denies cp, n/v/d, abd pain, f/c  Objective: Vitals:  08/14/22 1200 08/14/22 1230 08/14/22 1300 08/14/22 1330  BP: (!) 149/85 (!) 162/92 130/82 116/62  Pulse: 71 69 70 70  Resp: (!) Temp:      TempSrc:      SpO2: 95% 96% 95% 96%  Weight:      Height:         Intake/Output Summary (Last 24 hours) at 08/14/2022 1403 Last data filed at 08/14/2022 1358 Gross per 24 hour  Intake 940.37 ml  Output 400 ml  Net 540.37 ml   Weight change: -2.1 kg Exam:  General:  Pt is alert, follows commands appropriately, not in acute distress HEENT: No icterus, No thrush, No neck mass, [place]/AT Cardiovascular: RRR, S1/S2, no rubs, no gallops Respiratory: bibasilar crackles. No wheeze Abdomen: Soft/+BS, non tender, non distended, no guarding Extremities: no edema, No lymphangitis, No petechiae, No rashes, no synovitis   Data Reviewed: I have personally reviewed following labs and imaging studies Basic Metabolic Panel: Recent Labs  Lab 08/12/22 1124 08/13/22 1257 08/14/22 0343  NA 132* 130* 128*  K 4.1 4.6 4.9  CL 97* 97* 96*  CO2 24 21* 23  GLUCOSE 190* 236* 194*  BUN 41* 46* 49*  CREATININE 1.52* 1.44* 1.36*  CALCIUM 9.0 8.8* 9.0  MG 1.9 1.9 1.9   Liver Function Tests: No results for input(s): "AST", "ALT", "ALKPHOS", "BILITOT", "PROT", "ALBUMIN" in the last 168 hours. No results for input(s): "LIPASE", "AMYLASE" in the last 168 hours. No results for input(s): "AMMONIA" in the last 168 hours. Coagulation Profile: No results for input(s): "INR", "PROTIME" in the last 168 hours. CBC: Recent Labs  Lab 08/12/22 1124 08/13/22 1257 08/14/22 0343  WBC 10.5 9.3 10.7*  HGB 14.0 12.2* 11.8*  HCT 41.5 35.8* 35.2*  MCV 95.0 93.7 94.6  PLT 199 213 221   Cardiac Enzymes: No results for input(s): "CKTOTAL", "CKMB", "CKMBINDEX", "TROPONINI" in the last 168 hours. BNP: Invalid input(s): "POCBNP" CBG: No results for input(s): "GLUCAP" in the last 168 hours. HbA1C: Recent Labs    08/12/22 1528  HGBA1C 5.4   Urine analysis:    Component Value Date/Time   COLORURINE AMBER (A) 12/03/2016 1026   APPEARANCEUR CLEAR 12/03/2016 1026   LABSPEC 1.023 12/03/2016 1026   PHURINE 5.0 12/03/2016 1026   GLUCOSEU NEGATIVE 12/03/2016 1026   HGBUR SMALL  (A) 12/03/2016 1026   BILIRUBINUR NEGATIVE 12/03/2016 1026   KETONESUR 5 (A) 12/03/2016 1026   PROTEINUR >=300 (A) 12/03/2016 1026   NITRITE NEGATIVE 12/03/2016 1026   LEUKOCYTESUR NEGATIVE 12/03/2016 1026   Sepsis Labs: (procalcitonin:4,lacticidven:4) ) Recent Results (from the past 240 hour(s))  Resp panel by RT-PCR (RSV, Flu A&B, Covid) Anterior Nasal Swab     Status: None   Collection Time: 08/12/22 11:34 AM   Specimen: Anterior Nasal Swab  Result Value Ref Range Status   SARS Coronavirus 2 by RT PCR NEGATIVE NEGATIVE Final    Comment: (NOTE) SARS-CoV-2 target nucleic acids are NOT DETECTED.  The SARS-CoV-2 RNA is generally detectable in upper respiratory specimens during the acute phase of infection. The lowest concentration of SARS-CoV-2 viral copies this assay can detect is 138 copies/mL. A negative result does not preclude SARS-Cov-2 infection and should not be used as the sole basis for treatment or other patient management decisions. A negative result may occur with  improper specimen collection/handling, submission of specimen other than nasopharyngeal swab, presence of viral mutation(s) within the areas targeted by this assay, and inadequate number of viral copies(<138  copies/mL). A negative result must be combined with clinical observations, patient history, and epidemiological information. The expected result is Negative.  Fact Sheet for Patients:  BloggerCourse.com  Fact Sheet for Healthcare Providers:  SeriousBroker.it  This test is no t yet approved or cleared by the [place] FDA and  has been authorized for detection and/or diagnosis of SARS-CoV-2 by FDA under an Emergency Use Authorization (EUA). This EUA will remain  in effect (meaning this test can be used) for the duration of the COVID-19 declaration under Section 564(b)(1) of the Act, 21 U.S.C.section 360bbb-3(b)(1), unless the  authorization is terminated  or revoked sooner.       Influenza A by PCR NEGATIVE NEGATIVE Final   Influenza B by PCR NEGATIVE NEGATIVE Final    Comment: (NOTE) The Xpert Xpress SARS-CoV-2/FLU/RSV plus assay is intended as an aid in the diagnosis of influenza from Nasopharyngeal swab specimens and should not be used as a sole basis for treatment. Nasal washings and aspirates are unacceptable for Xpert Xpress SARS-CoV-2/FLU/RSV testing.  Fact Sheet for Patients: BloggerCourse.com  Fact Sheet for Healthcare Providers: SeriousBroker.it  This test is not yet approved or cleared by the [place] FDA and has been authorized for detection and/or diagnosis of SARS-CoV-2 by FDA under an Emergency Use Authorization (EUA). This EUA will remain in effect (meaning this test can be used) for the duration of the COVID-19 declaration under Section 564(b)(1) of the Act, 21 U.S.C. section 360bbb-3(b)(1), unless the authorization is terminated or revoked.     Resp Syncytial Virus by PCR NEGATIVE NEGATIVE Final    Comment: (NOTE) Fact Sheet for Patients: BloggerCourse.com  Fact Sheet for Healthcare Providers: SeriousBroker.it  This test is not yet approved or cleared by the [place] FDA and has been authorized for detection and/or diagnosis of SARS-CoV-2 by FDA under an Emergency Use Authorization (EUA). This EUA will remain in effect (meaning this test can be used) for the duration of the COVID-19 declaration under Section 564(b)(1) of the Act, 21 U.S.C. section 360bbb-3(b)(1), unless the authorization is terminated or revoked.  Performed at [facility], [address], [place], [place] [address]   Respiratory (~20 pathogens) panel by PCR     Status: None   Collection Time: 08/12/22  3:52 PM   Specimen: Nasopharyngeal Swab; Respiratory  Result Value Ref Range Status   Adenovirus  NOT DETECTED NOT DETECTED Final   Coronavirus 229E NOT DETECTED NOT DETECTED Final    Comment: (NOTE) The Coronavirus on the Respiratory Panel, DOES NOT test for the novel  Coronavirus (2019 nCoV)    Coronavirus HKU1 NOT DETECTED NOT DETECTED Final   Coronavirus NL63 NOT DETECTED NOT DETECTED Final   Coronavirus OC43 NOT DETECTED NOT DETECTED Final   Metapneumovirus NOT DETECTED NOT DETECTED Final   Rhinovirus / Enterovirus NOT DETECTED NOT DETECTED Final   Influenza A NOT DETECTED NOT DETECTED Final   Influenza B NOT DETECTED NOT DETECTED Final   Parainfluenza Virus 1 NOT DETECTED NOT DETECTED Final   Parainfluenza Virus 2 NOT DETECTED NOT DETECTED Final   Parainfluenza Virus 3 NOT DETECTED NOT DETECTED Final   Parainfluenza Virus 4 NOT DETECTED NOT DETECTED Final   Respiratory Syncytial Virus NOT DETECTED NOT DETECTED Final   Bordetella pertussis NOT DETECTED NOT DETECTED Final   Bordetella Parapertussis NOT DETECTED NOT DETECTED Final   Chlamydophila pneumoniae NOT DETECTED NOT DETECTED Final   Mycoplasma pneumoniae NOT DETECTED NOT DETECTED Final    Comment: Performed at [facility],  [address], [place], [place] [address]  MRSA Next Gen by PCR, Nasal     Status: None   Collection Time: 08/12/22  3:52 PM   Specimen: Nasal Mucosa; Nasal Swab  Result Value Ref Range Status   MRSA by PCR Next Gen NOT DETECTED NOT DETECTED Final    Comment: (NOTE) The GeneXpert MRSA Assay (FDA approved for NASAL specimens only), is one component of a comprehensive MRSA colonization surveillance program. It is not intended to diagnose MRSA infection nor to guide or monitor treatment for MRSA infections. Test performance is not FDA approved in patients less than 15 years old. Performed at [facility], [address] [place], [place] [address]      Scheduled Meds:  apixaban  5 mg Oral BID   arformoterol  15 mcg Nebulization BID   atorvastatin  40 mg Oral QHS   budesonide (PULMICORT)  nebulizer solution  0.5 mg Nebulization BID   Chlorhexidine Gluconate Cloth  6 each Topical Daily   cyanocobalamin  1,000 mcg Oral Daily   doxycycline  100 mg Oral Q12H   ipratropium-albuterol  3 mL Nebulization TID   methylPREDNISolone (SOLU-MEDROL) injection  40 mg Intravenous Q12H   metoprolol tartrate  50 mg Oral TID   pantoprazole  40 mg Oral QAC supper   tacrolimus  1.5 mg Oral Daily   And   tacrolimus  2 mg Oral QHS   Continuous Infusions:  cefTRIAXone (ROCEPHIN)  IV Stopped (08/14/22 1259)   diltiazem (CARDIZEM) infusion 10 mg/hr (08/14/22 1358)    Procedures/Studies: CT CHEST WO CONTRAST  Result Date: 08/12/2022 CLINICAL DATA:  Dyspnea.  Chronic cough.  COPD. EXAM: CT CHEST WITHOUT CONTRAST TECHNIQUE: Multidetector CT imaging of the chest was performed following the standard protocol without IV contrast. RADIATION DOSE REDUCTION: This exam was performed according to the departmental dose-optimization program which includes automated exposure control, adjustment of the mA and/or kV according to patient size and/or use of iterative reconstruction technique. COMPARISON:  X-ray 08/12/2022 and older.  CT angiogram 05/17/2021 FINDINGS: Cardiovascular: Trace pericardial fluid. Heart is nonenlarged. Coronary artery calcifications are seen. There also calcifications along the area of the aortic valve and mitral valve annulus. The thoracic aorta on this noncontrast exam has a normal course and caliber with prominent calcified plaque including along the origin of the great vessels, in particular the left subclavian artery. Mediastinum/Nodes: No specific abnormal lymph node enlargement identified in the axillary region or hilum. There are some prominent to mildly enlarged mediastinal nodes however for example right paratracheal posteriorly along the upper mediastinum on series 2, image 28 measuring 17 by 9 mm, precarinal on series 2, image 65 measuring 2.2 by 1.1 cm. These are unchanged from the  previous examination. Other nodes are present as well left paratracheal, subcarinal. There are some lymph nodes in the mediastinum and hila with calcifications. Lungs/Pleura: Centrilobular emphysematous lung changes are identified. There are calcified lung nodules bilaterally consistent with old granulomatous disease. No pneumothorax or effusion. There is some ill-defined reticular parenchymal opacities identified in the right lower lobe, middle lobe and lingula. Areas in the right lower lobe and lingula were seen on the previous examination could be more chronic. There are some associated areas of bronchial wall thickening. The dependent ill-defined areas in the middle lobe are new from the examination of 2023. There is a noncalcified nodule measuring 4 mm in the inferior aspect of the right upper lobe on series 4, image 73 which is stable on the prior and  by report been stable since 2018 demonstrating long-term stability. Upper Abdomen: Along the upper abdomen the kidneys are severely atrophic. Slight thickening of the left adrenal gland is stable. Musculoskeletal: Mild degenerative changes are seen along the spine. IMPRESSION: Advanced emphysematous lung changes. There is some ill-defined opacity and reticular changes involving the right lower lobe greater than middle lobe and lingula. The areas in the right lower lobe and lingula were seen on the study of 2023. This could be chronic versus a recurrent acute process. The areas in the middle lobe were not seen at the time. Again an acute process is possible and recommend follow-up to confirm clearance. Evidence of old granulomatous disease. Stable mildly enlarged mediastinal lymph nodes Aortic Atherosclerosis (ICD10-I70.0) and Emphysema (ICD10-J43.9). Electronically Signed   By: Karen Kays M.D.   On: 08/12/2022 15:50   DG Chest Port 1 View  Result Date: 08/12/2022 CLINICAL DATA:  Shortness of breath EXAM: PORTABLE CHEST 1 VIEW COMPARISON:  Radiograph  05/26/2021, 05/17/2021 FINDINGS: Unchanged cardiomediastinal silhouette. Emphysema with prominent interstitial opacities and peripheral reticulation, similar to previous exams. No new focal airspace disease. No large effusion or evidence of pneumothorax. IMPRESSION: Emphysema with chronic interstitial changes. No new focal airspace disease. Electronically Signed   By: Caprice Renshaw M.D.   On: 08/12/2022 11:51    Catarina Hartshorn, DO  [facility]  If 7PM-7AM, please contact night-coverage [email or web address] Password [email or web address] 08/14/2022, 2:03 PM   LOS: 1 day

## 2022-08-14 NOTE — Progress Notes (Signed)
   08/14/22 1413  ReDS Vest / Clip  Station Marker C  Ruler Value 18  ReDS Value Range (!) > 40  ReDS Actual Value 57  Anatomical Comments patient dangled on edge of bed

## 2022-08-15 ENCOUNTER — Inpatient Hospital Stay (HOSPITAL_COMMUNITY): Payer: No Typology Code available for payment source

## 2022-08-15 ENCOUNTER — Other Ambulatory Visit (HOSPITAL_COMMUNITY): Payer: Self-pay | Admitting: *Deleted

## 2022-08-15 DIAGNOSIS — J441 Chronic obstructive pulmonary disease with (acute) exacerbation: Secondary | ICD-10-CM | POA: Diagnosis not present

## 2022-08-15 DIAGNOSIS — I350 Nonrheumatic aortic (valve) stenosis: Secondary | ICD-10-CM

## 2022-08-15 DIAGNOSIS — I4892 Unspecified atrial flutter: Secondary | ICD-10-CM | POA: Diagnosis not present

## 2022-08-15 DIAGNOSIS — I5033 Acute on chronic diastolic (congestive) heart failure: Secondary | ICD-10-CM | POA: Diagnosis not present

## 2022-08-15 DIAGNOSIS — I10 Essential (primary) hypertension: Secondary | ICD-10-CM | POA: Diagnosis not present

## 2022-08-15 DIAGNOSIS — J181 Lobar pneumonia, unspecified organism: Secondary | ICD-10-CM | POA: Diagnosis not present

## 2022-08-15 DIAGNOSIS — I4891 Unspecified atrial fibrillation: Secondary | ICD-10-CM | POA: Diagnosis not present

## 2022-08-15 DIAGNOSIS — I351 Nonrheumatic aortic (valve) insufficiency: Secondary | ICD-10-CM

## 2022-08-15 DIAGNOSIS — J9601 Acute respiratory failure with hypoxia: Secondary | ICD-10-CM | POA: Diagnosis not present

## 2022-08-15 LAB — BASIC METABOLIC PANEL
Anion gap: 8 (ref 5–15)
BUN: 57 mg/dL — ABNORMAL HIGH (ref 8–23)
CO2: 23 mmol/L (ref 22–32)
Calcium: 9 mg/dL (ref 8.9–10.3)
Chloride: 96 mmol/L — ABNORMAL LOW (ref 98–111)
Creatinine, Ser: 1.57 mg/dL — ABNORMAL HIGH (ref 0.61–1.24)
GFR, Estimated: 49 mL/min — ABNORMAL LOW (ref >60)
Glucose, Bld: 149 mg/dL — ABNORMAL HIGH (ref 70–99)
Potassium: 5.4 mmol/L — ABNORMAL HIGH (ref 3.5–5.1)
Sodium: 127 mmol/L — ABNORMAL LOW (ref 135–145)

## 2022-08-15 LAB — CBC
HCT: 34.9 % — ABNORMAL LOW (ref 39.0–52.0)
Hemoglobin: 11.7 g/dL — ABNORMAL LOW (ref 13.0–17.0)
MCH: 31.6 pg (ref 26.0–34.0)
MCHC: 33.5 g/dL (ref 30.0–36.0)
MCV: 94.3 fL (ref 80.0–100.0)
Platelets: 243 10*3/uL (ref 150–400)
RBC: 3.7 MIL/uL — ABNORMAL LOW (ref 4.22–5.81)
RDW: 13.8 % (ref 11.5–15.5)
WBC: 10.7 10*3/uL — ABNORMAL HIGH (ref 4.0–10.5)
nRBC: 0 % (ref 0.0–0.2)

## 2022-08-15 LAB — MAGNESIUM: Magnesium: 1.8 mg/dL (ref 1.7–2.4)

## 2022-08-15 LAB — ECHOCARDIOGRAM COMPLETE
AR max vel: 0.94 cm2
AV Area VTI: 1.03 cm2
AV Area mean vel: 0.94 cm2
AV Mean grad: 14 mmHg
AV Peak grad: 23.6 mmHg
Ao pk vel: 2.43 m/s
Area-P 1/2: 5.54 cm2
Height: 68 in
P 1/2 time: 364 msec
S' Lateral: 2.6 cm
Weight: 1682.55 oz

## 2022-08-15 LAB — TSH: TSH: 0.365 u[IU]/mL (ref 0.350–4.500)

## 2022-08-15 MED ORDER — SODIUM ZIRCONIUM CYCLOSILICATE 10 G PO PACK
10.0000 g | PACK | Freq: Once | ORAL | Status: DC
  Filled 2022-08-15: qty 1

## 2022-08-15 MED ORDER — FUROSEMIDE 10 MG/ML IJ SOLN
80.0000 mg | Freq: Every day | INTRAMUSCULAR | Status: DC
  Administered 2022-08-15: 80 mg via INTRAVENOUS
  Filled 2022-08-15: qty 8

## 2022-08-15 MED ORDER — GUAIFENESIN-DM 100-10 MG/5ML PO SYRP
5.0000 mL | ORAL_SOLUTION | ORAL | Status: DC | PRN
  Administered 2022-08-15: 5 mL via ORAL
  Filled 2022-08-15: qty 5

## 2022-08-15 NOTE — Progress Notes (Signed)
*  PRELIMINARY RESULTS* Echocardiogram 2D Echocardiogram has been performed.  Stacey Drain 08/15/2022, 12:43 PM

## 2022-08-15 NOTE — Progress Notes (Signed)
Per preliminary d/w Dr. Jenene Slicker, patient placed on DCCV schedule for tomorrow at 10am - she recommends dose of IV Lasix  today and tomorrow AM prior to procedure. She discussed procedure with pt. Orders written including NPO after midnight except sips with meds.

## 2022-08-15 NOTE — Progress Notes (Signed)
Progress Note  Patient Name: Calvin Carroll Date of Encounter: 08/15/2022  Primary Cardiologist: None  Subjective  No acute events overnight, telemetry reviewed, showed atrial flutter rate controlled.  Inpatient Medications    Scheduled Meds:  albuterol  2.5 mg Nebulization TID   apixaban  5 mg Oral BID   arformoterol  15 mcg Nebulization BID   atorvastatin  40 mg Oral QHS   azaTHIOprine  50 mg Oral Daily   budesonide (PULMICORT) nebulizer solution  0.5 mg Nebulization BID   Chlorhexidine Gluconate Cloth  6 each Topical Daily   cyanocobalamin  1,000 mcg Oral Daily   doxycycline  100 mg Oral Q12H   furosemide  80 mg Intravenous Daily   methylPREDNISolone (SOLU-MEDROL) injection  40 mg Intravenous Q12H   metoprolol tartrate  50 mg Oral TID   pantoprazole  40 mg Oral QAC supper   revefenacin  175 mcg Nebulization Daily   sodium zirconium cyclosilicate  10 g Oral Once   tacrolimus  1.5 mg Oral Daily   And   tacrolimus  2 mg Oral QHS   Continuous Infusions:  cefTRIAXone (ROCEPHIN)  IV Stopped (08/14/22 1259)   diltiazem (CARDIZEM) infusion 10 mg/hr (08/15/22 0719)   PRN Meds: acetaminophen **OR** acetaminophen, guaiFENesin-dextromethorphan, HYDROcodone bit-homatropine, hydrOXYzine, ondansetron **OR** ondansetron (ZOFRAN) IV, phenol   Vital Signs    Vitals:   08/15/22 0839 08/15/22 0842 08/15/22 0849 08/15/22 0852  BP:      Pulse:      Resp:      Temp:      TempSrc:      SpO2: 97% 98% 99% 98%  Weight:      Height:        Intake/Output Summary (Last 24 hours) at 08/15/2022 1012 Last data filed at 08/15/2022 0903 Gross per 24 hour  Intake 891.58 ml  Output 550 ml  Net 341.58 ml   Filed Weights   08/13/22 0515 08/14/22 0500 08/15/22 0500  Weight: 47.7 kg 47.6 kg 47.7 kg    Telemetry     Personally reviewed, atrial flutter  ECG    Atrial flutter  Physical Exam   GEN: No acute distress.   Neck: JVD+ Cardiac: RRR, no murmur, rub, or gallop.   Respiratory: Nonlabored. Clear to auscultation bilaterally. GI: Soft, nontender, bowel sounds present. MS: No edema; No deformity. Neuro:  Nonfocal. Psych: Alert and oriented x 3. Normal affect.  Labs    Chemistry Recent Labs  Lab 08/13/22 1257 08/14/22 0343 08/15/22 0513  NA 130* 128* 127*  K 4.6 4.9 5.4*  CL 97* 96* 96*  CO2 21* 23 23  GLUCOSE 236* 194* 149*  BUN 46* 49* 57*  CREATININE 1.44* 1.36* 1.57*  CALCIUM 8.8* 9.0 9.0  GFRNONAA 54* 58* 49*  ANIONGAP Hematology Recent Labs  Lab 08/13/22 1257 08/14/22 0343 08/15/22 0513  WBC 9.3 10.7* 10.7*  RBC 3.82* 3.72* 3.70*  HGB 12.2* 11.8* 11.7*  HCT 35.8* 35.2* 34.9*  MCV 93.7 94.6 94.3  MCH 31.9 31.7 31.6  MCHC 34.1 33.5 33.5  RDW 14.4 14.1 13.8  PLT 213 221 243    Cardiac EnzymesNo results for input(s): "TROPONINIHS" in the last 720 hours.  BNP Recent Labs  Lab 08/12/22 1124 08/14/22 1500  BNP 1,240.0* 678.0*     DDimerNo results for input(s): "DDIMER" in the last 168 hours.   Radiology    DG Chest 1 View  Result Date: 08/14/2022 CLINICAL DATA:  Shortness  of breath EXAM: CHEST  1 VIEW COMPARISON:  08/12/2022 FINDINGS: Bilateral interstitial opacities with mild hyperinflation. Normal cardiomediastinal contours and pleural spaces. IMPRESSION: Bilateral interstitial opacities with mild hyperinflation, likely due to COPD. Electronically Signed   By: Deatra Robinson M.D.   On: 08/14/2022 19:55    Cardiac Studies  Echocardiogram in 04/2021 LVEF 55 to 60% Mild LVH In G1 DD RV systolic function is normal Mild aortic valve regurgitation suspect mild aortic valve stenosis, suboptimal Doppler interrogation Borderline dilatation of aortic root, 38 mm   Assessment & Plan    # New onset of atrial flutter: Patient had history of A-fib followed by EP with plans for ablation in July. EKG this admission showed atrial flutter, rate controlled.  Continue metoprolol tartrate 50 mg twice daily and Eliquis  5 mg twice daily.  Did not miss any doses of Eliquis in the last 3 weeks.  He will benefit from DCCV tomorrow on 08/16/2022. Outpatient OSA evaluation due to history of A-fib and new onset of atrial flutter. -The risks, benefits and alternatives for the procedure DCCV were discussed with the patient, the patient comprehended these risks and agreed to proceed with the procedure, DCCV.  Risks include, but are not limited to, cough, sore throat, vomiting, nausea, infection, aspiration pneumonia, cardiac arrest, possibility of PPM implantation.  # Acute on chronic HFpEF:ReDS high, patient has SOB symptoms.  Administer IV Lasix 80 mg today and tomorrow prior to DCCV.  # History renal transplant: On tacrolimus, prednisone, mycophenolate.  And medication changes will need to crosscheck with his immunosuppressants.  # HTN: Home antihypertensive medications were held to allow room for AV nodal agents.  # Mild AI and mild AS on 2023 echo: Outpatient monitoring  I have spent a total of 30 minutes with patient reviewing chart , telemetry, EKGs, labs and examining patient as well as establishing an assessment and plan that was discussed with the patient.  > 50% of time was spent in direct patient care.     Signed, Christeen Lai P Gabbie Marzo, MD  08/15/2022, 10:12 AM

## 2022-08-15 NOTE — Progress Notes (Signed)
PROGRESS NOTE  Calvin Carroll ZOX:[id number] DOB: 06-Jul-1955 DOA: 08/12/2022 PCP: [facility]  Brief History:  67 year old male with a history of renal transplant x 2 in 2003 and 2011, COPD, tobacco abuse, coronary artery disease with history of MI, atrial fibrillation, and hyperlipidemia presenting with 2-week history of shortness of breath, coughing, and chest congestion.  He states that he has been coughing up some yellow sputum, but denies any hemoptysis.  He has had some subjective sweats but has not taken his temperature.  Notably, the patient has a portable heart monitor that he has been using and has noted that he has had atrial fibrillation with RVR in the past 4 days.  He states that his blood pressure has been running soft with systolic BP in the low 100s.  He denies any syncope, but feels tired and short of breath.  He states that he did have some chest discomfort when he was having RVR with his atrial fibrillation.  At present, the patient denies any chest discomfort, nausea, vomiting or direct abdominal pain.  He continues to smoke slightly over 1 pack/day.  He states that he last picked up a cigarette on 08/08/2022.  Because of his atrial fibrillation and rapid heart rate he presented for further evaluation and treatment.  He denies any worsening lower extremity edema or orthopnea. In the ED, the patient was afebrile hemodynamically stable with oxygen saturation 90% on room air.  WBC 10.5, hemoglobin 14.0, platelets 199,000.  Sodium 132, potassium 4.1, bicarbonate 24, serum creatinine 1.52.  COVID-19 PCR was negative.  BNP 1240.  Chest x-ray showed chronic interstitial markings and hyperinflation.  EKG showed atrial flutter with right bundle branch block.  The patient was started on diltiazem.  Cardiology was consulted to assist with management.  The patient subsequently developed RVR.  He was started on diltiazem drip.  His rate was controlled, but remained in atrial  flutter.  Cardiology plan for cardioversion on 08/16/2022.   Assessment/Plan: Acute respiratory failure with hypoxia -Secondary COPD exacerbation -Initially placed on 2 L nasal cannula -Presented with hypoxia and tachypnea -Wean oxygen for saturation greater 92% -08/02/2022 CT chest--advanced emphysema; ill-defined opacity and reticular nodular change in the RLL>RML; RML opacities are new; mildly enlarged mediastinal lymph nodes -obtain ReDS reading -echo   COPD exacerbation -continue IV Solu-Medrol -continue Pulmicort -Continue on duonebs -Continue Brovana -COVID/Flu/RSV neg -viral resp panel--neg -continue yupelri   Lobar pneumonia --08/02/2022 CT chest--advanced emphysema; ill-defined opacity and reticular nodular change in the RLL>RML; RML opacities are new; mildly enlarged mediastinal lymph nodes -continue ceftriaxone -Continue doxycycline -PCT 0.26>>0.13   Atrial fibrillation/flutter with RVR, type unspecified -pt states he is due to ablation on 11/07/22 -appreciate cardiology consult -restart metoprolol--increased to 3 times daily -continue diltiazem gtt>>now rate controlled, remains in a flutter -continue apixaban -obtain echo   Renal transplant recipient -continue tacrolimus and azathioprine -no longer taking mycophenolate -on IV solumedrol so hold prednisone   Tobacco abuse -tobacco cessation discussed   Hyperlipidemia -continue statin   CAD/elevated troponin -Secondary to demand ischemia -no chest pain presently -continue statin, metoprolol, apixaban                   Family Communication:   Significant other at bedside 4/20   Consultants: Cardiology   Code Status:  FULL    DVT Prophylaxis: Apixaban     Procedures: As Listed in Progress Note Above   Antibiotics: Ceftriaxone 4/20>> Doxycycline 4/19>>  Subjective: Pt states he is breathing a little better today.  Denies f/c, cp, n/v/d.  No abd  pain  Objective: Vitals:   08/15/22 1117 08/15/22 1200 08/15/22 1230 08/15/22 1300  BP:  120/77 (!) 159/96 (!) 162/75  Pulse:  68 69 67  Resp:  20 (!) 25 (!) 22  Temp: 97.8 F (36.6 C)     TempSrc: Oral     SpO2:  95% 95% 97%  Weight:      Height:        Intake/Output Summary (Last 24 hours) at 08/15/2022 1351 Last data filed at 08/15/2022 1117 Gross per 24 hour  Intake 771.58 ml  Output 750 ml  Net 21.58 ml   Weight change: 0.1 kg Exam:  General:  Pt is alert, follows commands appropriately, not in acute distress HEENT: No icterus, No thrush, No neck mass, [place]/AT Cardiovascular: RRR, S1/S2, no rubs, no gallops Respiratory: diminished BS.  Bibasilar rales.  No wheeze Abdomen: Soft/+BS, non tender, non distended, no guarding Extremities: No edema, No lymphangitis, No petechiae, No rashes, no synovitis   Data Reviewed: I have personally reviewed following labs and imaging studies Basic Metabolic Panel: Recent Labs  Lab 08/12/22 1124 08/13/22 1257 08/14/22 0343 08/15/22 0513  NA 132* 130* 128* 127*  K 4.1 4.6 4.9 5.4*  CL 97* 97* 96* 96*  CO2 24 21* 23 23  GLUCOSE 190* 236* 194* 149*  BUN 41* 46* 49* 57*  CREATININE 1.52* 1.44* 1.36* 1.57*  CALCIUM 9.0 8.8* 9.0 9.0  MG 1.9 1.9 1.9 1.8   Liver Function Tests: No results for input(s): "AST", "ALT", "ALKPHOS", "BILITOT", "PROT", "ALBUMIN" in the last 168 hours. No results for input(s): "LIPASE", "AMYLASE" in the last 168 hours. No results for input(s): "AMMONIA" in the last 168 hours. Coagulation Profile: No results for input(s): "INR", "PROTIME" in the last 168 hours. CBC: Recent Labs  Lab 08/12/22 1124 08/13/22 1257 08/14/22 0343 08/15/22 0513  WBC 10.5 9.3 10.7* 10.7*  HGB 14.0 12.2* 11.8* 11.7*  HCT 41.5 35.8* 35.2* 34.9*  MCV 95.0 93.7 94.6 94.3  PLT 199 213 221 243   Cardiac Enzymes: No results for input(s): "CKTOTAL", "CKMB", "CKMBINDEX", "TROPONINI" in the last 168 hours. BNP: Invalid  input(s): "POCBNP" CBG: No results for input(s): "GLUCAP" in the last 168 hours. HbA1C: Recent Labs    08/12/22 1528  HGBA1C 5.4   Urine analysis:    Component Value Date/Time   COLORURINE AMBER (A) 12/03/2016 1026   APPEARANCEUR CLEAR 12/03/2016 1026   LABSPEC 1.023 12/03/2016 1026   PHURINE 5.0 12/03/2016 1026   GLUCOSEU NEGATIVE 12/03/2016 1026   HGBUR SMALL (A) 12/03/2016 1026   BILIRUBINUR NEGATIVE 12/03/2016 1026   KETONESUR 5 (A) 12/03/2016 1026   PROTEINUR >=300 (A) 12/03/2016 1026   NITRITE NEGATIVE 12/03/2016 1026   LEUKOCYTESUR NEGATIVE 12/03/2016 1026   Sepsis Labs: (procalcitonin:4,lacticidven:4) ) Recent Results (from the past 240 hour(s))  Resp panel by RT-PCR (RSV, Flu A&B, Covid) Anterior Nasal Swab     Status: None   Collection Time: 08/12/22 11:34 AM   Specimen: Anterior Nasal Swab  Result Value Ref Range Status   SARS Coronavirus 2 by RT PCR NEGATIVE NEGATIVE Final    Comment: (NOTE) SARS-CoV-2 target nucleic acids are NOT DETECTED.  The SARS-CoV-2 RNA is generally detectable in upper respiratory specimens during the acute phase of infection. The lowest concentration of SARS-CoV-2 viral copies this assay can detect is 138 copies/mL. A negative result does not preclude SARS-Cov-2 infection  and should not be used as the sole basis for treatment or other patient management decisions. A negative result may occur with  improper specimen collection/handling, submission of specimen other than nasopharyngeal swab, presence of viral mutation(s) within the areas targeted by this assay, and inadequate number of viral copies(<138 copies/mL). A negative result must be combined with clinical observations, patient history, and epidemiological information. The expected result is Negative.  Fact Sheet for Patients:  BloggerCourse.com  Fact Sheet for Healthcare Providers:  SeriousBroker.it  This test  is no t yet approved or cleared by the [place] FDA and  has been authorized for detection and/or diagnosis of SARS-CoV-2 by FDA under an Emergency Use Authorization (EUA). This EUA will remain  in effect (meaning this test can be used) for the duration of the COVID-19 declaration under Section 564(b)(1) of the Act, 21 U.S.C.section 360bbb-3(b)(1), unless the authorization is terminated  or revoked sooner.       Influenza A by PCR NEGATIVE NEGATIVE Final   Influenza B by PCR NEGATIVE NEGATIVE Final    Comment: (NOTE) The Xpert Xpress SARS-CoV-2/FLU/RSV plus assay is intended as an aid in the diagnosis of influenza from Nasopharyngeal swab specimens and should not be used as a sole basis for treatment. Nasal washings and aspirates are unacceptable for Xpert Xpress SARS-CoV-2/FLU/RSV testing.  Fact Sheet for Patients: BloggerCourse.com  Fact Sheet for Healthcare Providers: SeriousBroker.it  This test is not yet approved or cleared by the [place] FDA and has been authorized for detection and/or diagnosis of SARS-CoV-2 by FDA under an Emergency Use Authorization (EUA). This EUA will remain in effect (meaning this test can be used) for the duration of the COVID-19 declaration under Section 564(b)(1) of the Act, 21 U.S.C. section 360bbb-3(b)(1), unless the authorization is terminated or revoked.     Resp Syncytial Virus by PCR NEGATIVE NEGATIVE Final    Comment: (NOTE) Fact Sheet for Patients: BloggerCourse.com  Fact Sheet for Healthcare Providers: SeriousBroker.it  This test is not yet approved or cleared by the [place] FDA and has been authorized for detection and/or diagnosis of SARS-CoV-2 by FDA under an Emergency Use Authorization (EUA). This EUA will remain in effect (meaning this test can be used) for the duration of the COVID-19 declaration under Section  564(b)(1) of the Act, 21 U.S.C. section 360bbb-3(b)(1), unless the authorization is terminated or revoked.  Performed at [facility], [address] [place], [place] [address]   Respiratory (~20 pathogens) panel by PCR     Status: None   Collection Time: 08/12/22  3:52 PM   Specimen: Nasopharyngeal Swab; Respiratory  Result Value Ref Range Status   Adenovirus NOT DETECTED NOT DETECTED Final   Coronavirus 229E NOT DETECTED NOT DETECTED Final    Comment: (NOTE) The Coronavirus on the Respiratory Panel, DOES NOT test for the novel  Coronavirus (2019 nCoV)    Coronavirus HKU1 NOT DETECTED NOT DETECTED Final   Coronavirus NL63 NOT DETECTED NOT DETECTED Final   Coronavirus OC43 NOT DETECTED NOT DETECTED Final   Metapneumovirus NOT DETECTED NOT DETECTED Final   Rhinovirus / Enterovirus NOT DETECTED NOT DETECTED Final   Influenza A NOT DETECTED NOT DETECTED Final   Influenza B NOT DETECTED NOT DETECTED Final   Parainfluenza Virus 1 NOT DETECTED NOT DETECTED Final   Parainfluenza Virus 2 NOT DETECTED NOT DETECTED Final   Parainfluenza Virus 3 NOT DETECTED NOT DETECTED Final   Parainfluenza Virus 4 NOT DETECTED NOT DETECTED Final   Respiratory Syncytial Virus NOT  DETECTED NOT DETECTED Final   Bordetella pertussis NOT DETECTED NOT DETECTED Final   Bordetella Parapertussis NOT DETECTED NOT DETECTED Final   Chlamydophila pneumoniae NOT DETECTED NOT DETECTED Final   Mycoplasma pneumoniae NOT DETECTED NOT DETECTED Final    Comment: Performed at [facility], [address], [place], [place] [address]  MRSA Next Gen by PCR, Nasal     Status: None   Collection Time: 08/12/22  3:52 PM   Specimen: Nasal Mucosa; Nasal Swab  Result Value Ref Range Status   MRSA by PCR Next Gen NOT DETECTED NOT DETECTED Final    Comment: (NOTE) The GeneXpert MRSA Assay (FDA approved for NASAL specimens only), is one component of a comprehensive MRSA colonization surveillance program. It is not intended to  diagnose MRSA infection nor to guide or monitor treatment for MRSA infections. Test performance is not FDA approved in patients less than 71 years old. Performed at [facility], [address], [place], [place] [address]      Scheduled Meds:  albuterol  2.5 mg Nebulization TID   apixaban  5 mg Oral BID   arformoterol  15 mcg Nebulization BID   atorvastatin  40 mg Oral QHS   azaTHIOprine  50 mg Oral Daily   budesonide (PULMICORT) nebulizer solution  0.5 mg Nebulization BID   Chlorhexidine Gluconate Cloth  6 each Topical Daily   cyanocobalamin  1,000 mcg Oral Daily   doxycycline  100 mg Oral Q12H   furosemide  80 mg Intravenous Daily   methylPREDNISolone (SOLU-MEDROL) injection  40 mg Intravenous Q12H   metoprolol tartrate  50 mg Oral TID   pantoprazole  40 mg Oral QAC supper   revefenacin  175 mcg Nebulization Daily   tacrolimus  1.5 mg Oral Daily   And   tacrolimus  2 mg Oral QHS   Continuous Infusions:  cefTRIAXone (ROCEPHIN)  IV 1 g (08/15/22 1233)   diltiazem (CARDIZEM) infusion 10 mg/hr (08/15/22 0719)    Procedures/Studies: DG Chest 1 View  Result Date: 08/14/2022 CLINICAL DATA:  Shortness of breath EXAM: CHEST  1 VIEW COMPARISON:  08/12/2022 FINDINGS: Bilateral interstitial opacities with mild hyperinflation. Normal cardiomediastinal contours and pleural spaces. IMPRESSION: Bilateral interstitial opacities with mild hyperinflation, likely due to COPD. Electronically Signed   By: Deatra Robinson M.D.   On: 08/14/2022 19:55   CT CHEST WO CONTRAST  Result Date: 08/12/2022 CLINICAL DATA:  Dyspnea.  Chronic cough.  COPD. EXAM: CT CHEST WITHOUT CONTRAST TECHNIQUE: Multidetector CT imaging of the chest was performed following the standard protocol without IV contrast. RADIATION DOSE REDUCTION: This exam was performed according to the departmental dose-optimization program which includes automated exposure control, adjustment of the mA and/or kV according to patient size and/or use  of iterative reconstruction technique. COMPARISON:  X-ray 08/12/2022 and older.  CT angiogram 05/17/2021 FINDINGS: Cardiovascular: Trace pericardial fluid. Heart is nonenlarged. Coronary artery calcifications are seen. There also calcifications along the area of the aortic valve and mitral valve annulus. The thoracic aorta on this noncontrast exam has a normal course and caliber with prominent calcified plaque including along the origin of the great vessels, in particular the left subclavian artery. Mediastinum/Nodes: No specific abnormal lymph node enlargement identified in the axillary region or hilum. There are some prominent to mildly enlarged mediastinal nodes however for example right paratracheal posteriorly along the upper mediastinum on series 2, image 28 measuring 17 by 9 mm, precarinal on series 2, image 65 measuring 2.2 by 1.1 cm. These  are unchanged from the previous examination. Other nodes are present as well left paratracheal, subcarinal. There are some lymph nodes in the mediastinum and hila with calcifications. Lungs/Pleura: Centrilobular emphysematous lung changes are identified. There are calcified lung nodules bilaterally consistent with old granulomatous disease. No pneumothorax or effusion. There is some ill-defined reticular parenchymal opacities identified in the right lower lobe, middle lobe and lingula. Areas in the right lower lobe and lingula were seen on the previous examination could be more chronic. There are some associated areas of bronchial wall thickening. The dependent ill-defined areas in the middle lobe are new from the examination of 2023. There is a noncalcified nodule measuring 4 mm in the inferior aspect of the right upper lobe on series 4, image 73 which is stable on the prior and by report been stable since 2018 demonstrating long-term stability. Upper Abdomen: Along the upper abdomen the kidneys are severely atrophic. Slight thickening of the left adrenal gland is  stable. Musculoskeletal: Mild degenerative changes are seen along the spine. IMPRESSION: Advanced emphysematous lung changes. There is some ill-defined opacity and reticular changes involving the right lower lobe greater than middle lobe and lingula. The areas in the right lower lobe and lingula were seen on the study of 2023. This could be chronic versus a recurrent acute process. The areas in the middle lobe were not seen at the time. Again an acute process is possible and recommend follow-up to confirm clearance. Evidence of old granulomatous disease. Stable mildly enlarged mediastinal lymph nodes Aortic Atherosclerosis (ICD10-I70.0) and Emphysema (ICD10-J43.9). Electronically Signed   By: Karen Kays M.D.   On: 08/12/2022 15:50   DG Chest Port 1 View  Result Date: 08/12/2022 CLINICAL DATA:  Shortness of breath EXAM: PORTABLE CHEST 1 VIEW COMPARISON:  Radiograph 05/26/2021, 05/17/2021 FINDINGS: Unchanged cardiomediastinal silhouette. Emphysema with prominent interstitial opacities and peripheral reticulation, similar to previous exams. No new focal airspace disease. No large effusion or evidence of pneumothorax. IMPRESSION: Emphysema with chronic interstitial changes. No new focal airspace disease. Electronically Signed   By: Caprice Renshaw M.D.   On: 08/12/2022 11:51    Catarina Hartshorn, DO  [facility]  If 7PM-7AM, please contact night-coverage [email or web address] Password [email or web address] 08/15/2022, 1:51 PM   LOS: 2 days

## 2022-08-16 ENCOUNTER — Encounter (HOSPITAL_COMMUNITY): Payer: Self-pay | Admitting: Internal Medicine

## 2022-08-16 DIAGNOSIS — I10 Essential (primary) hypertension: Secondary | ICD-10-CM | POA: Diagnosis not present

## 2022-08-16 DIAGNOSIS — I4891 Unspecified atrial fibrillation: Secondary | ICD-10-CM | POA: Diagnosis not present

## 2022-08-16 DIAGNOSIS — J9601 Acute respiratory failure with hypoxia: Secondary | ICD-10-CM | POA: Diagnosis not present

## 2022-08-16 DIAGNOSIS — I5033 Acute on chronic diastolic (congestive) heart failure: Secondary | ICD-10-CM | POA: Diagnosis not present

## 2022-08-16 DIAGNOSIS — J441 Chronic obstructive pulmonary disease with (acute) exacerbation: Secondary | ICD-10-CM | POA: Diagnosis not present

## 2022-08-16 DIAGNOSIS — I4892 Unspecified atrial flutter: Secondary | ICD-10-CM | POA: Diagnosis not present

## 2022-08-16 DIAGNOSIS — Q2112 Patent foramen ovale: Secondary | ICD-10-CM

## 2022-08-16 LAB — CBC
HCT: 36.2 % — ABNORMAL LOW (ref 39.0–52.0)
Hemoglobin: 12.2 g/dL — ABNORMAL LOW (ref 13.0–17.0)
MCH: 31.5 pg (ref 26.0–34.0)
MCHC: 33.7 g/dL (ref 30.0–36.0)
MCV: 93.5 fL (ref 80.0–100.0)
Platelets: 230 10*3/uL (ref 150–400)
RBC: 3.87 MIL/uL — ABNORMAL LOW (ref 4.22–5.81)
RDW: 13.8 % (ref 11.5–15.5)
WBC: 9.4 10*3/uL (ref 4.0–10.5)
nRBC: 0 % (ref 0.0–0.2)

## 2022-08-16 LAB — BASIC METABOLIC PANEL
Anion gap: 9 (ref 5–15)
BUN: 63 mg/dL — ABNORMAL HIGH (ref 8–23)
CO2: 24 mmol/L (ref 22–32)
Calcium: 8.8 mg/dL — ABNORMAL LOW (ref 8.9–10.3)
Chloride: 94 mmol/L — ABNORMAL LOW (ref 98–111)
Creatinine, Ser: 1.69 mg/dL — ABNORMAL HIGH (ref 0.61–1.24)
GFR, Estimated: 44 mL/min — ABNORMAL LOW (ref >60)
Glucose, Bld: 209 mg/dL — ABNORMAL HIGH (ref 70–99)
Potassium: 5 mmol/L (ref 3.5–5.1)
Sodium: 127 mmol/L — ABNORMAL LOW (ref 135–145)

## 2022-08-16 MED ORDER — FUROSEMIDE 10 MG/ML IJ SOLN
80.0000 mg | Freq: Two times a day (BID) | INTRAMUSCULAR | Status: DC
  Administered 2022-08-16 (×2): 80 mg via INTRAVENOUS
  Filled 2022-08-16 (×3): qty 8

## 2022-08-16 MED ORDER — HYDRALAZINE HCL 20 MG/ML IJ SOLN
10.0000 mg | Freq: Four times a day (QID) | INTRAMUSCULAR | Status: DC | PRN
  Administered 2022-08-16 – 2022-08-19 (×6): 10 mg via INTRAVENOUS
  Filled 2022-08-16 (×6): qty 1

## 2022-08-16 MED ORDER — SENNA 8.6 MG PO TABS
2.0000 | ORAL_TABLET | Freq: Every day | ORAL | Status: DC
  Administered 2022-08-16 – 2022-08-19 (×4): 17.2 mg via ORAL
  Filled 2022-08-16 (×4): qty 2

## 2022-08-16 MED ORDER — HYDRALAZINE HCL 25 MG PO TABS
25.0000 mg | ORAL_TABLET | Freq: Three times a day (TID) | ORAL | Status: DC
  Administered 2022-08-16 – 2022-08-18 (×5): 25 mg via ORAL
  Filled 2022-08-16 (×5): qty 1

## 2022-08-16 MED ORDER — POLYETHYLENE GLYCOL 3350 17 G PO PACK
17.0000 g | PACK | Freq: Every day | ORAL | Status: DC
  Administered 2022-08-16 – 2022-08-19 (×3): 17 g via ORAL
  Filled 2022-08-16 (×3): qty 1

## 2022-08-16 MED ORDER — DILTIAZEM HCL-DEXTROSE 125-5 MG/125ML-% IV SOLN (PREMIX)
5.0000 mg/h | INTRAVENOUS | Status: DC
  Administered 2022-08-16: 5 mg/h via INTRAVENOUS
  Administered 2022-08-17: 10 mg/h via INTRAVENOUS
  Filled 2022-08-16: qty 125

## 2022-08-16 NOTE — Progress Notes (Addendum)
PROGRESS NOTE  Calvin CARRERAS Carroll:[id number] DOB: January 11, 1956 DOA: 08/12/2022 PCP: [facility]  Brief History:  67 year old male with a history of renal transplant x 2 in 2003 and 2011, COPD, tobacco abuse, coronary artery disease with history of MI, atrial fibrillation, and hyperlipidemia presenting with 2-week history of shortness of breath, coughing, and chest congestion.  He states that he has been coughing up some yellow sputum, but denies any hemoptysis.  He has had some subjective sweats but has not taken his temperature.  Notably, the patient has a portable heart monitor that he has been using and has noted that he has had atrial fibrillation with RVR in the past 4 days.  He states that his blood pressure has been running soft with systolic BP in the low 100s.  He denies any syncope, but feels tired and short of breath.  He states that he did have some chest discomfort when he was having RVR with his atrial fibrillation.  At present, the patient denies any chest discomfort, nausea, vomiting or direct abdominal pain.  He continues to smoke slightly over 1 pack/day.  He states that he last picked up a cigarette on 08/08/2022.  Because of his atrial fibrillation and rapid heart rate he presented for further evaluation and treatment.  He denies any worsening lower extremity edema or orthopnea. In the ED, the patient was afebrile hemodynamically stable with oxygen saturation 90% on room air.  WBC 10.5, hemoglobin 14.0, platelets 199,000.  Sodium 132, potassium 4.1, bicarbonate 24, serum creatinine 1.52.  COVID-19 PCR was negative.  BNP 1240.  Chest x-ray showed chronic interstitial markings and hyperinflation.  EKG showed atrial flutter with right bundle branch block.  The patient was started on diltiazem.  Cardiology was consulted to assist with management.  The patient subsequently developed RVR.  He was started on diltiazem drip.  His rate was controlled, but remained in atrial  flutter.  Cardiology plan for cardioversion on 08/16/2022.   Assessment/Plan: Acute respiratory failure with hypoxia -Secondary COPD exacerbation -Initially placed on 2 L nasal cannula -Presented with hypoxia and tachypnea -Wean oxygen for saturation greater 92% -08/02/2022 CT chest--advanced emphysema; ill-defined opacity and reticular nodular change in the RLL>RML; RML opacities are new; mildly enlarged mediastinal lymph nodes -4/21 ReDS reading--57 -echo EF 60-65%, no WMA, normal RVF   COPD exacerbation -continue IV Solu-Medrol -continue Pulmicort -Continue on duonebs -Continue Brovana -COVID/Flu/RSV neg -viral resp panel--neg -continue yupelri  Acute on chronic HFpEF -appreciate cardiology -continue IV lasix -weight 109.5>>105.1 lbs   Lobar pneumonia --08/02/2022 CT chest--advanced emphysema; ill-defined opacity and reticular nodular change in the RLL>RML; RML opacities are new; mildly enlarged mediastinal lymph nodes -continue ceftriaxone -Continue doxycycline -PCT 0.26>>0.13   Atrial fibrillation/flutter with RVR, type unspecified -pt states he is due to ablation on 11/07/22 -appreciate cardiology consult -restart metoprolol--increased to 3 times daily -continue diltiazem gtt>>now rate controlled, remains in a flutter -continue apixaban -obtain echo---echo EF 60-65%, no WMA, normal RVF -DCCV delayed till 4/24   Renal transplant recipient -continue tacrolimus and azathioprine -no longer taking mycophenolate -on IV solumedrol so hold prednisone   Tobacco abuse -tobacco cessation discussed   Hyperlipidemia -continue statin   CAD/elevated troponin -Secondary to demand ischemia -no chest pain presently -continue statin, metoprolol, apixaban   Hyponatremia -improving with diuresis              Family Communication:   Significant other at bedside 4/20   Consultants: Cardiology  Code Status:  FULL    DVT Prophylaxis: Apixaban     Procedures: As  Listed in Progress Note Above   Antibiotics: Ceftriaxone 4/20>> Doxycycline 4/19>>            Subjective: Pt is breathing better.  Denies f/c, cp, vomiting, diarrhea, abd pain.    Objective: Vitals:   08/16/22 1000 08/16/22 1100 08/16/22 1232 08/16/22 1320  BP: (!) 163/85 (!) 181/94    Pulse: 68 68  86  Resp: 18 (!) 21  20  Temp:   97.8 F (36.6 C)   TempSrc:   Oral   SpO2: 96% 96%  99%  Weight:      Height:        Intake/Output Summary (Last 24 hours) at 08/16/2022 1602 Last data filed at 08/16/2022 1300 Gross per 24 hour  Intake 547.09 ml  Output 1220 ml  Net -672.91 ml   Weight change:  Exam:  General:  Pt is alert, follows commands appropriately, not in acute distress HEENT: No icterus, No thrush, No neck mass, [place]/AT Cardiovascular: RRR, S1/S2, no rubs, no gallops Respiratory: bibasilar crackles. No wheeze Abdomen: Soft/+BS, non tender, non distended, no guarding Extremities: No edema, No lymphangitis, No petechiae, No rashes, no synovitis   Data Reviewed: I have personally reviewed following labs and imaging studies Basic Metabolic Panel: Recent Labs  Lab 08/12/22 1124 08/13/22 1257 08/14/22 0343 08/15/22 0513 08/16/22 0250  NA 132* 130* 128* 127* 127*  K 4.1 4.6 4.9 5.4* 5.0  CL 97* 97* 96* 96* 94*  CO2 24 21* 23 23 24   GLUCOSE 190* 236* 194* 149* 209*  BUN 41* 46* 49* 57* 63*  CREATININE 1.52* 1.44* 1.36* 1.57* 1.69*  CALCIUM 9.0 8.8* 9.0 9.0 8.8*  MG 1.9 1.9 1.9 1.8  --    Liver Function Tests: No results for input(s): "AST", "ALT", "ALKPHOS", "BILITOT", "PROT", "ALBUMIN" in the last 168 hours. No results for input(s): "LIPASE", "AMYLASE" in the last 168 hours. No results for input(s): "AMMONIA" in the last 168 hours. Coagulation Profile: No results for input(s): "INR", "PROTIME" in the last 168 hours. CBC: Recent Labs  Lab 08/12/22 1124 08/13/22 1257 08/14/22 0343 08/15/22 0513 08/16/22 0250  WBC 10.5 9.3 10.7* 10.7* 9.4  HGB  14.0 12.2* 11.8* 11.7* 12.2*  HCT 41.5 35.8* 35.2* 34.9* 36.2*  MCV 95.0 93.7 94.6 94.3 93.5  PLT 199 213 221 243 230   Cardiac Enzymes: No results for input(s): "CKTOTAL", "CKMB", "CKMBINDEX", "TROPONINI" in the last 168 hours. BNP: Invalid input(s): "POCBNP" CBG: No results for input(s): "GLUCAP" in the last 168 hours. HbA1C: No results for input(s): "HGBA1C" in the last 72 hours. Urine analysis:    Component Value Date/Time   COLORURINE AMBER (A) 12/03/2016 1026   APPEARANCEUR CLEAR 12/03/2016 1026   LABSPEC 1.023 12/03/2016 1026   PHURINE 5.0 12/03/2016 1026   GLUCOSEU NEGATIVE 12/03/2016 1026   HGBUR SMALL (A) 12/03/2016 1026   BILIRUBINUR NEGATIVE 12/03/2016 1026   KETONESUR 5 (A) 12/03/2016 1026   PROTEINUR >=300 (A) 12/03/2016 1026   NITRITE NEGATIVE 12/03/2016 1026   LEUKOCYTESUR NEGATIVE 12/03/2016 1026   Sepsis Labs: @LABRCNTIP (procalcitonin:4,lacticidven:4) ) Recent Results (from the past 240 hour(s))  Resp panel by RT-PCR (RSV, Flu A&B, Covid) Anterior Nasal Swab     Status: None   Collection Time: 08/12/22 11:34 AM   Specimen: Anterior Nasal Swab  Result Value Ref Range Status   SARS Coronavirus 2 by RT PCR NEGATIVE NEGATIVE Final    Comment: (  NOTE) SARS-CoV-2 target nucleic acids are NOT DETECTED.  The SARS-CoV-2 RNA is generally detectable in upper respiratory specimens during the acute phase of infection. The lowest concentration of SARS-CoV-2 viral copies this assay can detect is 138 copies/mL. A negative result does not preclude SARS-Cov-2 infection and should not be used as the sole basis for treatment or other patient management decisions. A negative result may occur with  improper specimen collection/handling, submission of specimen other than nasopharyngeal swab, presence of viral mutation(s) within the areas targeted by this assay, and inadequate number of viral copies(<138 copies/mL). A negative result must be combined with clinical  observations, patient history, and epidemiological information. The expected result is Negative.  Fact Sheet for Patients:  BloggerCourse.com  Fact Sheet for Healthcare Providers:  SeriousBroker.it  This test is no t yet approved or cleared by the [place] FDA and  has been authorized for detection and/or diagnosis of SARS-CoV-2 by FDA under an Emergency Use Authorization (EUA). This EUA will remain  in effect (meaning this test can be used) for the duration of the COVID-19 declaration under Section 564(b)(1) of the Act, 21 U.S.C.section 360bbb-3(b)(1), unless the authorization is terminated  or revoked sooner.       Influenza A by PCR NEGATIVE NEGATIVE Final   Influenza B by PCR NEGATIVE NEGATIVE Final    Comment: (NOTE) The Xpert Xpress SARS-CoV-2/FLU/RSV plus assay is intended as an aid in the diagnosis of influenza from Nasopharyngeal swab specimens and should not be used as a sole basis for treatment. Nasal washings and aspirates are unacceptable for Xpert Xpress SARS-CoV-2/FLU/RSV testing.  Fact Sheet for Patients: BloggerCourse.com  Fact Sheet for Healthcare Providers: SeriousBroker.it  This test is not yet approved or cleared by the [place] FDA and has been authorized for detection and/or diagnosis of SARS-CoV-2 by FDA under an Emergency Use Authorization (EUA). This EUA will remain in effect (meaning this test can be used) for the duration of the COVID-19 declaration under Section 564(b)(1) of the Act, 21 U.S.C. section 360bbb-3(b)(1), unless the authorization is terminated or revoked.     Resp Syncytial Virus by PCR NEGATIVE NEGATIVE Final    Comment: (NOTE) Fact Sheet for Patients: BloggerCourse.com  Fact Sheet for Healthcare Providers: SeriousBroker.it  This test is not yet approved or cleared by  the [place] FDA and has been authorized for detection and/or diagnosis of SARS-CoV-2 by FDA under an Emergency Use Authorization (EUA). This EUA will remain in effect (meaning this test can be used) for the duration of the COVID-19 declaration under Section 564(b)(1) of the Act, 21 U.S.C. section 360bbb-3(b)(1), unless the authorization is terminated or revoked.  Performed at [facility], [address] [place], [place] [address]   Respiratory (~20 pathogens) panel by PCR     Status: None   Collection Time: 08/12/22  3:52 PM   Specimen: Nasopharyngeal Swab; Respiratory  Result Value Ref Range Status   Adenovirus NOT DETECTED NOT DETECTED Final   Coronavirus 229E NOT DETECTED NOT DETECTED Final    Comment: (NOTE) The Coronavirus on the Respiratory Panel, DOES NOT test for the novel  Coronavirus (2019 nCoV)    Coronavirus HKU1 NOT DETECTED NOT DETECTED Final   Coronavirus NL63 NOT DETECTED NOT DETECTED Final   Coronavirus OC43 NOT DETECTED NOT DETECTED Final   Metapneumovirus NOT DETECTED NOT DETECTED Final   Rhinovirus / Enterovirus NOT DETECTED NOT DETECTED Final   Influenza A NOT DETECTED NOT DETECTED Final   Influenza B NOT DETECTED NOT DETECTED  Final   Parainfluenza Virus 1 NOT DETECTED NOT DETECTED Final   Parainfluenza Virus 2 NOT DETECTED NOT DETECTED Final   Parainfluenza Virus 3 NOT DETECTED NOT DETECTED Final   Parainfluenza Virus 4 NOT DETECTED NOT DETECTED Final   Respiratory Syncytial Virus NOT DETECTED NOT DETECTED Final   Bordetella pertussis NOT DETECTED NOT DETECTED Final   Bordetella Parapertussis NOT DETECTED NOT DETECTED Final   Chlamydophila pneumoniae NOT DETECTED NOT DETECTED Final   Mycoplasma pneumoniae NOT DETECTED NOT DETECTED Final    Comment: Performed at [facility], [address] [place], [place] [address]  MRSA Next Gen by PCR, Nasal     Status: None   Collection Time: 08/12/22  3:52 PM   Specimen: Nasal Mucosa; Nasal Swab  Result  Value Ref Range Status   MRSA by PCR Next Gen NOT DETECTED NOT DETECTED Final    Comment: (NOTE) The GeneXpert MRSA Assay (FDA approved for NASAL specimens only), is one component of a comprehensive MRSA colonization surveillance program. It is not intended to diagnose MRSA infection nor to guide or monitor treatment for MRSA infections. Test performance is not FDA approved in patients less than 47 years old. Performed at [facility], [address], [place], [place] [address]      Scheduled Meds:  albuterol  2.5 mg Nebulization TID   apixaban  5 mg Oral BID   arformoterol  15 mcg Nebulization BID   atorvastatin  40 mg Oral QHS   azaTHIOprine  50 mg Oral Daily   budesonide (PULMICORT) nebulizer solution  0.5 mg Nebulization BID   Chlorhexidine Gluconate Cloth  6 each Topical Daily   cyanocobalamin  1,000 mcg Oral Daily   doxycycline  100 mg Oral Q12H   furosemide  80 mg Intravenous BID   methylPREDNISolone (SOLU-MEDROL) injection  40 mg Intravenous Q12H   metoprolol tartrate  50 mg Oral TID   pantoprazole  40 mg Oral QAC supper   polyethylene glycol  17 g Oral Daily   revefenacin  175 mcg Nebulization Daily   senna  2 tablet Oral Daily   tacrolimus  1.5 mg Oral Daily   And   tacrolimus  2 mg Oral QHS   Continuous Infusions:  cefTRIAXone (ROCEPHIN)  IV 1 g (08/16/22 1327)    Procedures/Studies: ECHOCARDIOGRAM COMPLETE  Result Date: 08/15/2022    ECHOCARDIOGRAM REPORT   Patient Name:   BRACK SHADDOCK Date of Exam: 08/15/2022 Medical Rec #:  [id number]        Height:       68.0 in Accession #:    [id number]       Weight:       105.2 lb Date of Birth:  1956/04/01       BSA:          1.555 m Patient Age:    66 years         BP:           151/90 mmHg Patient Gender: M                HR:           67 bpm. [facility] Procedure: 2D Echo, Cardiac Doppler and Color Doppler Indications:    Atrial Fibrillation I48.91  History:        Patient has prior history of  Echocardiogram examinations, most                 recent  05/18/2021. Previous Myocardial Infarction, COPD,                 Arrythmias:Atrial Fibrillation; Risk Factors:Hypertension. Hx of                 COVID-19 virus infection.  Sonographer:    Celesta Gentile RCS Referring Phys: [phone] Harlean Regula IMPRESSIONS  1. Left ventricular ejection fraction, by estimation, is 60 to 65%. The left ventricle has normal function. The left ventricle has no regional wall motion abnormalities. Left ventricular diastolic function could not be evaluated due to atrial arrythmia.  The E/e' is 26.9.  2. Right ventricular systolic function is normal. The right ventricular size is normal. Tricuspid regurgitation signal is inadequate for assessing PA pressure.  3. Left atrial size was severely dilated.  4. Right atrial size was moderately dilated.  5. The mitral valve is abnormal. Trivial mitral valve regurgitation. No evidence of mitral stenosis. Moderate to severe mitral annular calcification.  6. The aortic valve was not well visualized. Visually, aortic valve regurgitation is mild but PHT measures 364 msec likely from diastolic dysfunction. Mild aortic valve stenosis. Aortic valve area, by VTI measures 1.03 cm. Aortic valve mean gradient measures 14.0 mmHg. Aortic valve Vmax measures 2.43 m/s.  7. Aortic dilatation noted. There is borderline dilatation of the aortic root, measuring 37 mm.  8. The inferior vena cava is dilated in size with <50% respiratory variability, suggesting right atrial pressure of 15 mmHg.  9. Evidence of atrial level shunting detected by color flow Doppler. There is a small patent foramen ovale with predominantly left to right shunting across the atrial septum. Comparison(s): No significant change from prior study. FINDINGS  Left Ventricle: Left ventricular ejection fraction, by estimation, is 60 to 65%. The left ventricle has normal function. The left ventricle has no regional wall motion abnormalities. The left  ventricular internal cavity size was normal in size. There is  no left ventricular hypertrophy. Left ventricular diastolic function could not be evaluated due to atrial fibrillation. Left ventricular diastolic function could not be evaluated. The E/e' is 26.9. Right Ventricle: The right ventricular size is normal. No increase in right ventricular wall thickness. Right ventricular systolic function is normal. Tricuspid regurgitation signal is inadequate for assessing PA pressure. Left Atrium: Left atrial size was severely dilated. Right Atrium: Right atrial size was moderately dilated. Pericardium: There is no evidence of pericardial effusion. Mitral Valve: The mitral valve is abnormal. Moderate to severe mitral annular calcification. Trivial mitral valve regurgitation. No evidence of mitral valve stenosis. Tricuspid Valve: The tricuspid valve is normal in structure. Tricuspid valve regurgitation is trivial. No evidence of tricuspid stenosis. Aortic Valve: The aortic valve was not well visualized. Aortic valve regurgitation is mild. Aortic regurgitation PHT measures 364 msec. Mild aortic stenosis is present. Aortic valve mean gradient measures 14.0 mmHg. Aortic valve peak gradient measures 23.6 mmHg. Aortic valve area, by VTI measures 1.03 cm. Pulmonic Valve: The pulmonic valve was not well visualized. Pulmonic valve regurgitation is not visualized. No evidence of pulmonic stenosis. Aorta: Aortic dilatation noted. There is borderline dilatation of the aortic root, measuring 37 mm. Venous: The inferior vena cava is dilated in size with less than 50% respiratory variability, suggesting right atrial pressure of 15 mmHg. IAS/Shunts: Evidence of atrial level shunting detected by color flow Doppler. A small patent foramen ovale is detected with predominantly left to right shunting across the atrial septum.  LEFT VENTRICLE PLAX 2D LVIDd:  3.90 cm   Diastology LVIDs:         2.60 cm   LV e' medial:    6.20 cm/s LV  PW:         0.90 cm   LV E/e' medial:  26.9 LV IVS:        1.20 cm   LV e' lateral:   7.94 cm/s LVOT diam:     1.90 cm   LV E/e' lateral: 21.0 LV SV:         58 LV SV Index:   37 LVOT Area:     2.84 cm  RIGHT VENTRICLE RV S prime:     12.20 cm/s TAPSE (M-mode): 2.0 cm LEFT ATRIUM              Index        RIGHT ATRIUM           Index LA diam:        4.20 cm  2.70 cm/m   RA Area:     24.00 cm LA Vol (A2C):   101.0 ml 64.95 ml/m  RA Volume:   74.50 ml  47.91 ml/m LA Vol (A4C):   102.0 ml 65.59 ml/m LA Biplane Vol: 103.0 ml 66.23 ml/m  AORTIC VALVE AV Area (Vmax):    0.94 cm AV Area (Vmean):   0.94 cm AV Area (VTI):     1.03 cm AV Vmax:           243.00 cm/s AV Vmean:          181.000 cm/s AV VTI:            0.564 m AV Peak Grad:      23.6 mmHg AV Mean Grad:      14.0 mmHg LVOT Vmax:         80.40 cm/s LVOT Vmean:        60.300 cm/s LVOT VTI:          0.204 m LVOT/AV VTI ratio: 0.36 AI PHT:            364 msec  AORTA Ao Root diam: 3.70 cm MITRAL VALVE MV Area (PHT): 5.54 cm     SHUNTS MV Decel Time: 137 msec     Systemic VTI:  0.20 m MV E velocity: 167.00 cm/s  Systemic Diam: 1.90 cm MV A velocity: 52.80 cm/s MV E/A ratio:  3.16 Vishnu Priya Mallipeddi Electronically signed by Winfield Rast Mallipeddi Signature Date/Time: 08/15/2022/3:06:35 PM    Final    DG Chest 1 View  Result Date: 08/14/2022 CLINICAL DATA:  Shortness of breath EXAM: CHEST  1 VIEW COMPARISON:  08/12/2022 FINDINGS: Bilateral interstitial opacities with mild hyperinflation. Normal cardiomediastinal contours and pleural spaces. IMPRESSION: Bilateral interstitial opacities with mild hyperinflation, likely due to COPD. Electronically Signed   By: Deatra Robinson M.D.   On: 08/14/2022 19:55   CT CHEST WO CONTRAST  Result Date: 08/12/2022 CLINICAL DATA:  Dyspnea.  Chronic cough.  COPD. EXAM: CT CHEST WITHOUT CONTRAST TECHNIQUE: Multidetector CT imaging of the chest was performed following the standard protocol without IV contrast. RADIATION  DOSE REDUCTION: This exam was performed according to the departmental dose-optimization program which includes automated exposure control, adjustment of the mA and/or kV according to patient size and/or use of iterative reconstruction technique. COMPARISON:  X-ray 08/12/2022 and older.  CT angiogram 05/17/2021 FINDINGS: Cardiovascular: Trace pericardial fluid. Heart is nonenlarged. Coronary artery calcifications are seen. There also calcifications along the area of the aortic  valve and mitral valve annulus. The thoracic aorta on this noncontrast exam has a normal course and caliber with prominent calcified plaque including along the origin of the great vessels, in particular the left subclavian artery. Mediastinum/Nodes: No specific abnormal lymph node enlargement identified in the axillary region or hilum. There are some prominent to mildly enlarged mediastinal nodes however for example right paratracheal posteriorly along the upper mediastinum on series 2, image 28 measuring 17 by 9 mm, precarinal on series 2, image 65 measuring 2.2 by 1.1 cm. These are unchanged from the previous examination. Other nodes are present as well left paratracheal, subcarinal. There are some lymph nodes in the mediastinum and hila with calcifications. Lungs/Pleura: Centrilobular emphysematous lung changes are identified. There are calcified lung nodules bilaterally consistent with old granulomatous disease. No pneumothorax or effusion. There is some ill-defined reticular parenchymal opacities identified in the right lower lobe, middle lobe and lingula. Areas in the right lower lobe and lingula were seen on the previous examination could be more chronic. There are some associated areas of bronchial wall thickening. The dependent ill-defined areas in the middle lobe are new from the examination of 2023. There is a noncalcified nodule measuring 4 mm in the inferior aspect of the right upper lobe on series 4, image 73 which is stable on  the prior and by report been stable since 2018 demonstrating long-term stability. Upper Abdomen: Along the upper abdomen the kidneys are severely atrophic. Slight thickening of the left adrenal gland is stable. Musculoskeletal: Mild degenerative changes are seen along the spine. IMPRESSION: Advanced emphysematous lung changes. There is some ill-defined opacity and reticular changes involving the right lower lobe greater than middle lobe and lingula. The areas in the right lower lobe and lingula were seen on the study of 2023. This could be chronic versus a recurrent acute process. The areas in the middle lobe were not seen at the time. Again an acute process is possible and recommend follow-up to confirm clearance. Evidence of old granulomatous disease. Stable mildly enlarged mediastinal lymph nodes Aortic Atherosclerosis (ICD10-I70.0) and Emphysema (ICD10-J43.9). Electronically Signed   By: Karen Kays M.D.   On: 08/12/2022 15:50   DG Chest Port 1 View  Result Date: 08/12/2022 CLINICAL DATA:  Shortness of breath EXAM: PORTABLE CHEST 1 VIEW COMPARISON:  Radiograph 05/26/2021, 05/17/2021 FINDINGS: Unchanged cardiomediastinal silhouette. Emphysema with prominent interstitial opacities and peripheral reticulation, similar to previous exams. No new focal airspace disease. No large effusion or evidence of pneumothorax. IMPRESSION: Emphysema with chronic interstitial changes. No new focal airspace disease. Electronically Signed   By: Caprice Renshaw M.D.   On: 08/12/2022 11:51    Catarina Hartshorn, DO  [facility]  If 7PM-7AM, please contact night-coverage [email or web address] Password [email or web address] 08/16/2022, 4:02 PM   LOS: 3 days

## 2022-08-16 NOTE — Progress Notes (Addendum)
Progress Note  Patient Name: Calvin Carroll Date of Encounter: 08/16/2022  Primary Cardiologist: None  Subjective  No acute events overnight, telemetry reviewed, showed atrial flutter rate controlled.  Inpatient Medications    Scheduled Meds:  albuterol  2.5 mg Nebulization TID   apixaban  5 mg Oral BID   arformoterol  15 mcg Nebulization BID   atorvastatin  40 mg Oral QHS   azaTHIOprine  50 mg Oral Daily   budesonide (PULMICORT) nebulizer solution  0.5 mg Nebulization BID   Chlorhexidine Gluconate Cloth  6 each Topical Daily   cyanocobalamin  1,000 mcg Oral Daily   doxycycline  100 mg Oral Q12H   furosemide  80 mg Intravenous BID   methylPREDNISolone (SOLU-MEDROL) injection  40 mg Intravenous Q12H   metoprolol tartrate  50 mg Oral TID   pantoprazole  40 mg Oral QAC supper   revefenacin  175 mcg Nebulization Daily   tacrolimus  1.5 mg Oral Daily   And   tacrolimus  2 mg Oral QHS   Continuous Infusions:  cefTRIAXone (ROCEPHIN)  IV Stopped (08/15/22 1303)   PRN Meds: acetaminophen **OR** acetaminophen, guaiFENesin-dextromethorphan, HYDROcodone bit-homatropine, hydrOXYzine, ondansetron **OR** ondansetron (ZOFRAN) IV, phenol   Vital Signs    Vitals:   08/16/22 0723 08/16/22 0800 08/16/22 0806 08/16/22 0900  BP:  (!) 151/90  (!) 168/95  Pulse: 68 69  67  Resp: 17 (!) 21  18  Temp:   98.2 F (36.8 C)   TempSrc:   Oral   SpO2: 97% 97%  97%  Weight:      Height:        Intake/Output Summary (Last 24 hours) at 08/16/2022 1007 Last data filed at 08/16/2022 0806 Gross per 24 hour  Intake 346.73 ml  Output 1825 ml  Net -1478.27 ml   Filed Weights   08/13/22 0515 08/14/22 0500 08/15/22 0500  Weight: 47.7 kg 47.6 kg 47.7 kg    Telemetry     Personally reviewed, atrial flutter  ECG    Atrial flutter  Physical Exam   GEN: No acute distress.   Neck: JVD+ Cardiac: RRR, no murmur, rub, or gallop.  Respiratory: Nonlabored. Clear to auscultation  bilaterally. GI: Soft, nontender, bowel sounds present. MS: No edema; No deformity. Neuro:  Nonfocal. Psych: Alert and oriented x 3. Normal affect.  Labs    Chemistry Recent Labs  Lab 08/14/22 0343 08/15/22 0513 08/16/22 0250  NA 128* 127* 127*  K 4.9 5.4* 5.0  CL 96* 96* 94*  CO2 23 23 24   GLUCOSE 194* 149* 209*  BUN 49* 57* 63*  CREATININE 1.36* 1.57* 1.69*  CALCIUM 9.0 9.0 8.8*  GFRNONAA 58* 49* 44*  ANIONGAP 9 8 9      Hematology Recent Labs  Lab 08/14/22 0343 08/15/22 0513 08/16/22 0250  WBC 10.7* 10.7* 9.4  RBC 3.72* 3.70* 3.87*  HGB 11.8* 11.7* 12.2*  HCT 35.2* 34.9* 36.2*  MCV 94.6 94.3 93.5  MCH 31.7 31.6 31.5  MCHC 33.5 33.5 33.7  RDW 14.1 13.8 13.8  PLT 221 243 230    Cardiac EnzymesNo results for input(s): "TROPONINIHS" in the last 720 hours.  BNP Recent Labs  Lab 08/12/22 1124 08/14/22 1500  BNP 1,240.0* 678.0*     DDimerNo results for input(s): "DDIMER" in the last 168 hours.   Radiology    ECHOCARDIOGRAM COMPLETE  Result Date: 08/15/2022    ECHOCARDIOGRAM REPORT   Patient Name:   Calvin Carroll Date of Exam: 08/15/2022 Medical  Rec #:  [id number]        Height:       68.0 in Accession #:    [id number]       Weight:       105.2 lb Date of Birth:  08-05-1955       BSA:          1.555 m Patient Age:    66 years         BP:           151/90 mmHg Patient Gender: M                HR:           67 bpm. Exam Location:  [facility] Procedure: 2D Echo, Cardiac Doppler and Color Doppler Indications:    Atrial Fibrillation I48.91  History:        Patient has prior history of Echocardiogram examinations, most                 recent 05/18/2021. Previous Myocardial Infarction, COPD,                 Arrythmias:Atrial Fibrillation; Risk Factors:Hypertension. Hx of                 COVID-19 virus infection.  Sonographer:    Celesta Gentile RCS Referring Phys: [phone] DAVID TAT IMPRESSIONS  1. Left ventricular ejection fraction, by estimation, is 60 to 65%. The left  ventricle has normal function. The left ventricle has no regional wall motion abnormalities. Left ventricular diastolic function could not be evaluated due to atrial arrythmia.  The E/e' is 26.9.  2. Right ventricular systolic function is normal. The right ventricular size is normal. Tricuspid regurgitation signal is inadequate for assessing PA pressure.  3. Left atrial size was severely dilated.  4. Right atrial size was moderately dilated.  5. The mitral valve is abnormal. Trivial mitral valve regurgitation. No evidence of mitral stenosis. Moderate to severe mitral annular calcification.  6. The aortic valve was not well visualized. Visually, aortic valve regurgitation is mild but PHT measures 364 msec likely from diastolic dysfunction. Mild aortic valve stenosis. Aortic valve area, by VTI measures 1.03 cm. Aortic valve mean gradient measures 14.0 mmHg. Aortic valve Vmax measures 2.43 m/s.  7. Aortic dilatation noted. There is borderline dilatation of the aortic root, measuring 37 mm.  8. The inferior vena cava is dilated in size with <50% respiratory variability, suggesting right atrial pressure of 15 mmHg.  9. Evidence of atrial level shunting detected by color flow Doppler. There is a small patent foramen ovale with predominantly left to right shunting across the atrial septum. Comparison(s): No significant change from prior study. FINDINGS  Left Ventricle: Left ventricular ejection fraction, by estimation, is 60 to 65%. The left ventricle has normal function. The left ventricle has no regional wall motion abnormalities. The left ventricular internal cavity size was normal in size. There is  no left ventricular hypertrophy. Left ventricular diastolic function could not be evaluated due to atrial fibrillation. Left ventricular diastolic function could not be evaluated. The E/e' is 26.9. Right Ventricle: The right ventricular size is normal. No increase in right ventricular wall thickness. Right ventricular  systolic function is normal. Tricuspid regurgitation signal is inadequate for assessing PA pressure. Left Atrium: Left atrial size was severely dilated. Right Atrium: Right atrial size was moderately dilated. Pericardium: There is no evidence of pericardial effusion. Mitral Valve: The mitral valve is abnormal. Moderate to severe  mitral annular calcification. Trivial mitral valve regurgitation. No evidence of mitral valve stenosis. Tricuspid Valve: The tricuspid valve is normal in structure. Tricuspid valve regurgitation is trivial. No evidence of tricuspid stenosis. Aortic Valve: The aortic valve was not well visualized. Aortic valve regurgitation is mild. Aortic regurgitation PHT measures 364 msec. Mild aortic stenosis is present. Aortic valve mean gradient measures 14.0 mmHg. Aortic valve peak gradient measures 23.6 mmHg. Aortic valve area, by VTI measures 1.03 cm. Pulmonic Valve: The pulmonic valve was not well visualized. Pulmonic valve regurgitation is not visualized. No evidence of pulmonic stenosis. Aorta: Aortic dilatation noted. There is borderline dilatation of the aortic root, measuring 37 mm. Venous: The inferior vena cava is dilated in size with less than 50% respiratory variability, suggesting right atrial pressure of 15 mmHg. IAS/Shunts: Evidence of atrial level shunting detected by color flow Doppler. A small patent foramen ovale is detected with predominantly left to right shunting across the atrial septum.  LEFT VENTRICLE PLAX 2D LVIDd:         3.90 cm   Diastology LVIDs:         2.60 cm   LV e' medial:    6.20 cm/s LV PW:         0.90 cm   LV E/e' medial:  26.9 LV IVS:        1.20 cm   LV e' lateral:   7.94 cm/s LVOT diam:     1.90 cm   LV E/e' lateral: 21.0 LV SV:         58 LV SV Index:   37 LVOT Area:     2.84 cm  RIGHT VENTRICLE RV S prime:     12.20 cm/s TAPSE (M-mode): 2.0 cm LEFT ATRIUM              Index        RIGHT ATRIUM           Index LA diam:        4.20 cm  2.70 cm/m   RA Area:      24.00 cm LA Vol (A2C):   101.0 ml 64.95 ml/m  RA Volume:   74.50 ml  47.91 ml/m LA Vol (A4C):   102.0 ml 65.59 ml/m LA Biplane Vol: 103.0 ml 66.23 ml/m  AORTIC VALVE AV Area (Vmax):    0.94 cm AV Area (Vmean):   0.94 cm AV Area (VTI):     1.03 cm AV Vmax:           243.00 cm/s AV Vmean:          181.000 cm/s AV VTI:            0.564 m AV Peak Grad:      23.6 mmHg AV Mean Grad:      14.0 mmHg LVOT Vmax:         80.40 cm/s LVOT Vmean:        60.300 cm/s LVOT VTI:          0.204 m LVOT/AV VTI ratio: 0.36 AI PHT:            364 msec  AORTA Ao Root diam: 3.70 cm MITRAL VALVE MV Area (PHT): 5.54 cm     SHUNTS MV Decel Time: 137 msec     Systemic VTI:  0.20 m MV E velocity: 167.00 cm/s  Systemic Diam: 1.90 cm MV A velocity: 52.80 cm/s MV E/A ratio:  3.16 Tally Mckinnon Priya Nemesis [place] Electronically signed by Winfield Rast Shacola Schussler  Signature Date/Time: 08/15/2022/3:06:35 PM    Final    DG Chest 1 View  Result Date: 08/14/2022 CLINICAL DATA:  Shortness of breath EXAM: CHEST  1 VIEW COMPARISON:  08/12/2022 FINDINGS: Bilateral interstitial opacities with mild hyperinflation. Normal cardiomediastinal contours and pleural spaces. IMPRESSION: Bilateral interstitial opacities with mild hyperinflation, likely due to COPD. Electronically Signed   By: Deatra Robinson M.D.   On: 08/14/2022 19:55    Cardiac Studies  Echocardiogram on 07/2022 LVEF 60 to 65%, E/e 26.9 RV systolic function is normal LA severely dilated RA moderately dilated Trivial MR Mild aortic valve stenosis Mild aortic valve regurgitation Aortic root dilation, 37 mm CVP 15 mmHg Small PFO with predominantly left-to-right shunt  Echocardiogram in 04/2021 LVEF 55 to 60% Mild LVH In G1 DD RV systolic function is normal Mild aortic valve regurgitation suspect mild aortic valve stenosis, suboptimal Doppler interrogation Borderline dilatation of aortic root, 38 mm   Assessment & Plan    # New onset of atrial flutter: Patient had history of  A-fib followed by EP with plans for ablation in July. EKG this admission showed atrial flutter, rate controlled. Continue metoprolol tartrate 50 mg twice daily and Eliquis 5 mg twice daily. Discontinue diltiazem drip. Did not miss any doses of Eliquis in the last 3 weeks.  He will benefit from DCCV tomorrow on 08/17/2022. Outpatient OSA evaluation due to history of A-fib and new onset of atrial flutter. -The risks, benefits and alternatives for the procedure DCCV were discussed with the patient, the patient comprehended these risks and agreed to proceed with the procedure, DCCV.  Risks include, but are not limited to, cough, sore throat, vomiting, nausea, infection, aspiration pneumonia, cardiac arrest, possibility of PPM implantation.  # Acute on chronic HFpEF: Patient symptoms of SOB improved.  Switch IV Lasix 80 mg from once daily to twice daily for DCCV procedure tomorrow.  # History renal transplant: On tacrolimus, prednisone, mycophenolate.  And medication changes will need to crosscheck with his immunosuppressants.  # HTN: Home antihypertensive medications were held to allow room for AV nodal agents.  # Mild AI and mild AS on 2023 echo: Outpatient monitoring  # Small PFO with left to right shunt on 4/24 echo: No intervention, diuresis as stated above.  I have spent a total of 30 minutes with patient reviewing chart , telemetry, EKGs, labs and examining patient as well as establishing an assessment and plan that was discussed with the patient.  > 50% of time was spent in direct patient care.     Signed, Marjo Bicker, MD  08/16/2022, 10:07 AM

## 2022-08-16 NOTE — H&P (View-Only) (Signed)
 Progress Note  Patient Name: Calvin Carroll Date of Encounter: 08/16/2022  Primary Cardiologist: None  Subjective  No acute events overnight, telemetry reviewed, showed atrial flutter rate controlled.  Inpatient Medications    Scheduled Meds:  albuterol  2.5 mg Nebulization TID   apixaban  5 mg Oral BID   arformoterol  15 mcg Nebulization BID   atorvastatin  40 mg Oral QHS   azaTHIOprine  50 mg Oral Daily   budesonide (PULMICORT) nebulizer solution  0.5 mg Nebulization BID   Chlorhexidine Gluconate Cloth  6 each Topical Daily   cyanocobalamin  1,000 mcg Oral Daily   doxycycline  100 mg Oral Q12H   furosemide  80 mg Intravenous BID   methylPREDNISolone (SOLU-MEDROL) injection  40 mg Intravenous Q12H   metoprolol tartrate  50 mg Oral TID   pantoprazole  40 mg Oral QAC supper   revefenacin  175 mcg Nebulization Daily   tacrolimus  1.5 mg Oral Daily   And   tacrolimus  2 mg Oral QHS   Continuous Infusions:  cefTRIAXone (ROCEPHIN)  IV Stopped (08/15/22 1303)   PRN Meds: acetaminophen **OR** acetaminophen, guaiFENesin-dextromethorphan, HYDROcodone bit-homatropine, hydrOXYzine, ondansetron **OR** ondansetron (ZOFRAN) IV, phenol   Vital Signs    Vitals:   08/16/22 0723 08/16/22 0800 08/16/22 0806 08/16/22 0900  BP:  (!) 151/90  (!) 168/95  Pulse: 68 69  67  Resp: 17 (!) 21  18  Temp:   98.2 F (36.8 C)   TempSrc:   Oral   SpO2: 97% 97%  97%  Weight:      Height:        Intake/Output Summary (Last 24 hours) at 08/16/2022 1007 Last data filed at 08/16/2022 0806 Gross per 24 hour  Intake 346.73 ml  Output 1825 ml  Net -1478.27 ml   Filed Weights   08/13/22 0515 08/14/22 0500 08/15/22 0500  Weight: 47.7 kg 47.6 kg 47.7 kg    Telemetry     Personally reviewed, atrial flutter  ECG    Atrial flutter  Physical Exam   GEN: No acute distress.   Neck: JVD+ Cardiac: RRR, no murmur, rub, or gallop.  Respiratory: Nonlabored. Clear to auscultation  bilaterally. GI: Soft, nontender, bowel sounds present. MS: No edema; No deformity. Neuro:  Nonfocal. Psych: Alert and oriented x 3. Normal affect.  Labs    Chemistry Recent Labs  Lab 08/14/22 0343 08/15/22 0513 08/16/22 0250  NA 128* 127* 127*  K 4.9 5.4* 5.0  CL 96* 96* 94*  CO2 23 23 24  GLUCOSE 194* 149* 209*  BUN 49* 57* 63*  CREATININE 1.36* 1.57* 1.69*  CALCIUM 9.0 9.0 8.8*  GFRNONAA 58* 49* 44*  ANIONGAP 9 8 9     Hematology Recent Labs  Lab 08/14/22 0343 08/15/22 0513 08/16/22 0250  WBC 10.7* 10.7* 9.4  RBC 3.72* 3.70* 3.87*  HGB 11.8* 11.7* 12.2*  HCT 35.2* 34.9* 36.2*  MCV 94.6 94.3 93.5  MCH 31.7 31.6 31.5  MCHC 33.5 33.5 33.7  RDW 14.1 13.8 13.8  PLT 221 243 230    Cardiac EnzymesNo results for input(s): "TROPONINIHS" in the last 720 hours.  BNP Recent Labs  Lab 08/12/22 1124 08/14/22 1500  BNP 1,240.0* 678.0*     DDimerNo results for input(s): "DDIMER" in the last 168 hours.   Radiology    ECHOCARDIOGRAM COMPLETE  Result Date: 08/15/2022    ECHOCARDIOGRAM REPORT   Patient Name:   Calvin Carroll Date of Exam: 08/15/2022 Medical   Rec #:  [id number]        Height:       68.0 in Accession #:    [id number]       Weight:       105.2 lb Date of Birth:  01/31/1956       BSA:          1.555 m Patient Age:    66 years         BP:           151/90 mmHg Patient Gender: M                HR:           67 bpm. Exam Location:  [place] Procedure: 2D Echo, Cardiac Doppler and Color Doppler Indications:    Atrial Fibrillation I48.91  History:        Patient has prior history of Echocardiogram examinations, most                 recent 05/18/2021. Previous Myocardial Infarction, COPD,                 Arrythmias:Atrial Fibrillation; Risk Factors:Hypertension. Hx of                 COVID-19 virus infection.  Sonographer:    Bernard White RCS Referring Phys: [id number] DAVID TAT IMPRESSIONS  1. Left ventricular ejection fraction, by estimation, is 60 to 65%. The left  ventricle has normal function. The left ventricle has no regional wall motion abnormalities. Left ventricular diastolic function could not be evaluated due to atrial arrythmia.  The E/e' is 26.9.  2. Right ventricular systolic function is normal. The right ventricular size is normal. Tricuspid regurgitation signal is inadequate for assessing PA pressure.  3. Left atrial size was severely dilated.  4. Right atrial size was moderately dilated.  5. The mitral valve is abnormal. Trivial mitral valve regurgitation. No evidence of mitral stenosis. Moderate to severe mitral annular calcification.  6. The aortic valve was not well visualized. Visually, aortic valve regurgitation is mild but PHT measures 364 msec likely from diastolic dysfunction. Mild aortic valve stenosis. Aortic valve area, by VTI measures 1.03 cm. Aortic valve mean gradient measures 14.0 mmHg. Aortic valve Vmax measures 2.43 m/s.  7. Aortic dilatation noted. There is borderline dilatation of the aortic root, measuring 37 mm.  8. The inferior vena cava is dilated in size with <50% respiratory variability, suggesting right atrial pressure of 15 mmHg.  9. Evidence of atrial level shunting detected by color flow Doppler. There is a small patent foramen ovale with predominantly left to right shunting across the atrial septum. Comparison(s): No significant change from prior study. FINDINGS  Left Ventricle: Left ventricular ejection fraction, by estimation, is 60 to 65%. The left ventricle has normal function. The left ventricle has no regional wall motion abnormalities. The left ventricular internal cavity size was normal in size. There is  no left ventricular hypertrophy. Left ventricular diastolic function could not be evaluated due to atrial fibrillation. Left ventricular diastolic function could not be evaluated. The E/e' is 26.9. Right Ventricle: The right ventricular size is normal. No increase in right ventricular wall thickness. Right ventricular  systolic function is normal. Tricuspid regurgitation signal is inadequate for assessing PA pressure. Left Atrium: Left atrial size was severely dilated. Right Atrium: Right atrial size was moderately dilated. Pericardium: There is no evidence of pericardial effusion. Mitral Valve: The mitral valve is abnormal. Moderate to severe   mitral annular calcification. Trivial mitral valve regurgitation. No evidence of mitral valve stenosis. Tricuspid Valve: The tricuspid valve is normal in structure. Tricuspid valve regurgitation is trivial. No evidence of tricuspid stenosis. Aortic Valve: The aortic valve was not well visualized. Aortic valve regurgitation is mild. Aortic regurgitation PHT measures 364 msec. Mild aortic stenosis is present. Aortic valve mean gradient measures 14.0 mmHg. Aortic valve peak gradient measures 23.6 mmHg. Aortic valve area, by VTI measures 1.03 cm. Pulmonic Valve: The pulmonic valve was not well visualized. Pulmonic valve regurgitation is not visualized. No evidence of pulmonic stenosis. Aorta: Aortic dilatation noted. There is borderline dilatation of the aortic root, measuring 37 mm. Venous: The inferior vena cava is dilated in size with less than 50% respiratory variability, suggesting right atrial pressure of 15 mmHg. IAS/Shunts: Evidence of atrial level shunting detected by color flow Doppler. A small patent foramen ovale is detected with predominantly left to right shunting across the atrial septum.  LEFT VENTRICLE PLAX 2D LVIDd:         3.90 cm   Diastology LVIDs:         2.60 cm   LV e' medial:    6.20 cm/s LV PW:         0.90 cm   LV E/e' medial:  26.9 LV IVS:        1.20 cm   LV e' lateral:   7.94 cm/s LVOT diam:     1.90 cm   LV E/e' lateral: 21.0 LV SV:         58 LV SV Index:   37 LVOT Area:     2.84 cm  RIGHT VENTRICLE RV S prime:     12.20 cm/s TAPSE (M-mode): 2.0 cm LEFT ATRIUM              Index        RIGHT ATRIUM           Index LA diam:        4.20 cm  2.70 cm/m   RA Area:      24.00 cm LA Vol (A2C):   101.0 ml 64.95 ml/m  RA Volume:   74.50 ml  47.91 ml/m LA Vol (A4C):   102.0 ml 65.59 ml/m LA Biplane Vol: 103.0 ml 66.23 ml/m  AORTIC VALVE AV Area (Vmax):    0.94 cm AV Area (Vmean):   0.94 cm AV Area (VTI):     1.03 cm AV Vmax:           243.00 cm/s AV Vmean:          181.000 cm/s AV VTI:            0.564 m AV Peak Grad:      23.6 mmHg AV Mean Grad:      14.0 mmHg LVOT Vmax:         80.40 cm/s LVOT Vmean:        60.300 cm/s LVOT VTI:          0.204 m LVOT/AV VTI ratio: 0.36 AI PHT:            364 msec  AORTA Ao Root diam: 3.70 cm MITRAL VALVE MV Area (PHT): 5.54 cm     SHUNTS MV Decel Time: 137 msec     Systemic VTI:  0.20 m MV E velocity: 167.00 cm/s  Systemic Diam: 1.90 cm MV A velocity: 52.80 cm/s MV E/A ratio:  3.16 Hallelujah Wysong Priya Gerrianne Aydelott Electronically signed by Traivon Morrical Priya Danny Yackley   Signature Date/Time: 08/15/2022/3:06:35 PM    Final    DG Chest 1 View  Result Date: 08/14/2022 CLINICAL DATA:  Shortness of breath EXAM: CHEST  1 VIEW COMPARISON:  08/12/2022 FINDINGS: Bilateral interstitial opacities with mild hyperinflation. Normal cardiomediastinal contours and pleural spaces. IMPRESSION: Bilateral interstitial opacities with mild hyperinflation, likely due to COPD. Electronically Signed   By: Kevin  Herman M.D.   On: 08/14/2022 19:55    Cardiac Studies  Echocardiogram on 07/2022 LVEF 60 to 65%, E/e 26.9 RV systolic function is normal LA severely dilated RA moderately dilated Trivial MR Mild aortic valve stenosis Mild aortic valve regurgitation Aortic root dilation, 37 mm CVP 15 mmHg Small PFO with predominantly left-to-right shunt  Echocardiogram in 04/2021 LVEF 55 to 60% Mild LVH In G1 DD RV systolic function is normal Mild aortic valve regurgitation suspect mild aortic valve stenosis, suboptimal Doppler interrogation Borderline dilatation of aortic root, 38 mm   Assessment & Plan    # New onset of atrial flutter: Patient had history of  A-fib followed by EP with plans for ablation in July. EKG this admission showed atrial flutter, rate controlled. Continue metoprolol tartrate 50 mg twice daily and Eliquis 5 mg twice daily. Discontinue diltiazem drip. Did not miss any doses of Eliquis in the last 3 weeks.  He will benefit from DCCV tomorrow on 08/17/2022. Outpatient OSA evaluation due to history of A-fib and new onset of atrial flutter. -The risks, benefits and alternatives for the procedure DCCV were discussed with the patient, the patient comprehended these risks and agreed to proceed with the procedure, DCCV.  Risks include, but are not limited to, cough, sore throat, vomiting, nausea, infection, aspiration pneumonia, cardiac arrest, possibility of PPM implantation.  # Acute on chronic HFpEF: Patient symptoms of SOB improved.  Switch IV Lasix 80 mg from once daily to twice daily for DCCV procedure tomorrow.  # History renal transplant: On tacrolimus, prednisone, mycophenolate.  And medication changes will need to crosscheck with his immunosuppressants.  # HTN: Home antihypertensive medications were held to allow room for AV nodal agents.  # Mild AI and mild AS on 2023 echo: Outpatient monitoring  # Small PFO with left to right shunt on 4/24 echo: No intervention, diuresis as stated above.  I have spent a total of 30 minutes with patient reviewing chart , telemetry, EKGs, labs and examining patient as well as establishing an assessment and plan that was discussed with the patient.  > 50% of time was spent in direct patient care.     Signed, Lynnet Hefley P France Lusty, MD  08/16/2022, 10:07 AM    

## 2022-08-16 NOTE — Progress Notes (Signed)
Per MD, DCCV cancelled for today and moved to tomorrow - first availability 1pm. Will need re-eval in rounds prior to committing to procedure so will hold off entering full procedure orders again, and make NPO after midnight except sips with meds.

## 2022-08-17 ENCOUNTER — Inpatient Hospital Stay (HOSPITAL_COMMUNITY): Payer: No Typology Code available for payment source | Admitting: Anesthesiology

## 2022-08-17 ENCOUNTER — Encounter (HOSPITAL_COMMUNITY): Payer: Self-pay | Admitting: Internal Medicine

## 2022-08-17 ENCOUNTER — Other Ambulatory Visit: Payer: Self-pay

## 2022-08-17 ENCOUNTER — Encounter (HOSPITAL_COMMUNITY)
Admission: EM | Disposition: A | Discharge status: Home / Self Care | Payer: Self-pay | Source: Home / Self Care | Attending: Internal Medicine

## 2022-08-17 DIAGNOSIS — I4892 Unspecified atrial flutter: Secondary | ICD-10-CM

## 2022-08-17 DIAGNOSIS — I10 Essential (primary) hypertension: Secondary | ICD-10-CM | POA: Diagnosis not present

## 2022-08-17 DIAGNOSIS — I2510 Atherosclerotic heart disease of native coronary artery without angina pectoris: Secondary | ICD-10-CM

## 2022-08-17 DIAGNOSIS — I5032 Chronic diastolic (congestive) heart failure: Secondary | ICD-10-CM

## 2022-08-17 DIAGNOSIS — I5033 Acute on chronic diastolic (congestive) heart failure: Secondary | ICD-10-CM | POA: Diagnosis not present

## 2022-08-17 DIAGNOSIS — J441 Chronic obstructive pulmonary disease with (acute) exacerbation: Secondary | ICD-10-CM | POA: Diagnosis not present

## 2022-08-17 DIAGNOSIS — I110 Hypertensive heart disease with heart failure: Secondary | ICD-10-CM

## 2022-08-17 DIAGNOSIS — I4891 Unspecified atrial fibrillation: Secondary | ICD-10-CM | POA: Diagnosis not present

## 2022-08-17 DIAGNOSIS — F17210 Nicotine dependence, cigarettes, uncomplicated: Secondary | ICD-10-CM

## 2022-08-17 DIAGNOSIS — J9601 Acute respiratory failure with hypoxia: Secondary | ICD-10-CM | POA: Diagnosis not present

## 2022-08-17 DIAGNOSIS — J449 Chronic obstructive pulmonary disease, unspecified: Secondary | ICD-10-CM

## 2022-08-17 DIAGNOSIS — I252 Old myocardial infarction: Secondary | ICD-10-CM

## 2022-08-17 DIAGNOSIS — J181 Lobar pneumonia, unspecified organism: Secondary | ICD-10-CM | POA: Diagnosis not present

## 2022-08-17 HISTORY — PX: CARDIOVERSION: SHX1299

## 2022-08-17 LAB — BASIC METABOLIC PANEL
Anion gap: 9 (ref 5–15)
BUN: 74 mg/dL — ABNORMAL HIGH (ref 8–23)
CO2: 25 mmol/L (ref 22–32)
Calcium: 8.8 mg/dL — ABNORMAL LOW (ref 8.9–10.3)
Chloride: 94 mmol/L — ABNORMAL LOW (ref 98–111)
Creatinine, Ser: 2.01 mg/dL — ABNORMAL HIGH (ref 0.61–1.24)
GFR, Estimated: 36 mL/min — ABNORMAL LOW (ref >60)
Glucose, Bld: 162 mg/dL — ABNORMAL HIGH (ref 70–99)
Potassium: 5 mmol/L (ref 3.5–5.1)
Sodium: 128 mmol/L — ABNORMAL LOW (ref 135–145)

## 2022-08-17 LAB — CBC
HCT: 36.7 % — ABNORMAL LOW (ref 39.0–52.0)
Hemoglobin: 12.6 g/dL — ABNORMAL LOW (ref 13.0–17.0)
MCH: 31.7 pg (ref 26.0–34.0)
MCHC: 34.3 g/dL (ref 30.0–36.0)
MCV: 92.4 fL (ref 80.0–100.0)
Platelets: 236 10*3/uL (ref 150–400)
RBC: 3.97 MIL/uL — ABNORMAL LOW (ref 4.22–5.81)
RDW: 13.6 % (ref 11.5–15.5)
WBC: 8.7 10*3/uL (ref 4.0–10.5)
nRBC: 0 % (ref 0.0–0.2)

## 2022-08-17 SURGERY — CARDIOVERSION
Anesthesia: General

## 2022-08-17 MED ORDER — LEVALBUTEROL HCL 0.63 MG/3ML IN NEBU
0.6300 mg | INHALATION_SOLUTION | Freq: Three times a day (TID) | RESPIRATORY_TRACT | Status: DC
  Administered 2022-08-18: 0.63 mg via RESPIRATORY_TRACT
  Filled 2022-08-17: qty 3

## 2022-08-17 MED ORDER — PROPOFOL 10 MG/ML IV BOLUS
INTRAVENOUS | Status: AC
  Filled 2022-08-17: qty 20

## 2022-08-17 MED ORDER — HYDROCORTISONE 1 % EX CREA: 1.0000 | TOPICAL_CREAM | Freq: Three times a day (TID) | CUTANEOUS | Status: DC | PRN

## 2022-08-17 MED ORDER — ORAL CARE MOUTH RINSE: 15.0000 mL | OROMUCOSAL | Status: DC | PRN

## 2022-08-17 MED ORDER — HYDROCORTISONE 1 % EX CREA
TOPICAL_CREAM | Freq: Three times a day (TID) | CUTANEOUS | Status: DC
  Filled 2022-08-17: qty 28

## 2022-08-17 MED ORDER — PREDNISONE 20 MG PO TABS
50.0000 mg | ORAL_TABLET | Freq: Every day | ORAL | Status: DC
  Administered 2022-08-18 – 2022-08-19 (×2): 50 mg via ORAL
  Filled 2022-08-17 (×2): qty 1

## 2022-08-17 MED ORDER — LACTATED RINGERS IV SOLN: INTRAVENOUS | Status: DC | PRN

## 2022-08-17 MED ORDER — PROPOFOL 10 MG/ML IV BOLUS
INTRAVENOUS | Status: DC | PRN
  Administered 2022-08-17: 30 mg via INTRAVENOUS
  Administered 2022-08-17: 20 mg via INTRAVENOUS
  Administered 2022-08-17: 30 mg via INTRAVENOUS
  Administered 2022-08-17: 70 mg via INTRAVENOUS

## 2022-08-17 NOTE — Progress Notes (Signed)
Pt transported down for procedure via WC.

## 2022-08-17 NOTE — Progress Notes (Signed)
Pt arrived back to unit, vitals WNL. Pt in no distress. Verbal order from Dr. Jenene Slicker for hydrocortisone cream for patient back and chest where pads were. Will continue to monitor.

## 2022-08-17 NOTE — Anesthesia Postprocedure Evaluation (Signed)
Anesthesia Post Note  Patient: Calvin Carroll  Procedure(s) Performed: CARDIOVERSION  Patient location during evaluation: Phase II Anesthesia Type: General Level of consciousness: awake and alert and oriented Pain management: pain level controlled Vital Signs Assessment: post-procedure vital signs reviewed and stable Respiratory status: spontaneous breathing, nonlabored ventilation and respiratory function stable Cardiovascular status: blood pressure returned to baseline and stable Postop Assessment: no apparent nausea or vomiting Anesthetic complications: no  No notable events documented.   Last Vitals:  Vitals:   08/17/22 1400 08/17/22 1434  BP: 135/72   Pulse: (!) 57 65  Resp: 16 17  Temp:    SpO2: 92% 98%    Last Pain:  Vitals:   08/17/22 1337  TempSrc:   PainSc: 0-No pain                 Clovis Warwick C Dorenda Pfannenstiel

## 2022-08-17 NOTE — Transfer of Care (Signed)
Immediate Anesthesia Transfer of Care Note  Patient: Calvin Carroll  Procedure(s) Performed: CARDIOVERSION  Patient Location: PACU  Anesthesia Type:General  Level of Consciousness: awake  Airway & Oxygen Therapy: Patient Spontanous Breathing and Patient connected to nasal cannula oxygen  Post-op Assessment: Report given to RN and Post -op Vital signs reviewed and stable  Post vital signs: Reviewed and stable  Last Vitals:  Vitals Value Taken Time  BP 125/75   Temp 98.2   Pulse 64   Resp 20   SpO2 95%     Last Pain:  Vitals:   08/17/22 1241  TempSrc: Oral  PainSc: 0-No pain      Patients Stated Pain Goal: 8 (08/17/22 1241)  Complications: No notable events documented.

## 2022-08-17 NOTE — Anesthesia Preprocedure Evaluation (Signed)
Anesthesia Evaluation  Patient identified by MRN, date of birth, ID band Patient awake    Reviewed: Allergy & Precautions, H&P , NPO status , Patient's Chart, lab work & pertinent test results  Airway Mallampati: II  TM Distance: >3 FB Neck ROM: Full    Dental  (+) Upper Dentures, Dental Advisory Given   Pulmonary shortness of breath and with exertion, pneumonia, COPD,  COPD inhaler, Current Smoker   Pulmonary exam normal breath sounds clear to auscultation       Cardiovascular Exercise Tolerance: Good hypertension, Pt. on medications + Past MI and +CHF  Normal cardiovascular exam+ dysrhythmias Atrial Fibrillation  Rhythm:Regular Rate:Normal     Neuro/Psych negative neurological ROS  negative psych ROS   GI/Hepatic negative GI ROS, Neg liver ROS,,,  Endo/Other  negative endocrine ROS    Renal/GU Renal InsufficiencyRenal disease (s/p renal transplant)  negative genitourinary   Musculoskeletal negative musculoskeletal ROS (+)    Abdominal   Peds negative pediatric ROS (+)  Hematology negative hematology ROS (+)   Anesthesia Other Findings On oxygen 3 liters  Reproductive/Obstetrics negative OB ROS                             Anesthesia Physical Anesthesia Plan  ASA: 4  Anesthesia Plan: General   Post-op Pain Management: Minimal or no pain anticipated   Induction: Intravenous  PONV Risk Score and Plan: 0 and Propofol infusion  Airway Management Planned: Nasal Cannula, Natural Airway and Simple Face Mask  Additional Equipment:   Intra-op Plan:   Post-operative Plan:   Informed Consent: I have reviewed the patients History and Physical, chart, labs and discussed the procedure including the risks, benefits and alternatives for the proposed anesthesia with the patient or authorized representative who has indicated his/her understanding and acceptance.       Plan Discussed  with: CRNA and Surgeon  Anesthesia Plan Comments:         Anesthesia Quick Evaluation

## 2022-08-17 NOTE — TOC Initial Note (Signed)
Transition of Care [facility]) - Initial/Assessment Note    Patient Details  Name: Calvin Carroll MRN: [id number] Date of Birth: 1956/01/31  Transition of Care [facility]) CM/SW Contact:    Elliot Gault, LCSW Phone Number: 08/17/2022, 11:22 AM  Clinical Narrative:                  Pt admitted from home. He now has a high readmission risk score. Met with pt at bedside to assess and review dc planning.   Pt reports that he lives with his sig other and plan is for return to home at dc. Pt states he is independent in ADLs at home. He does not currently use and DME or HH at home. Pt reports that he is able to get to appointments and obtain medications as needed.  Pt currently on O2 and pt states MD told him that he will likely need O2 at dc. Discussed options for O2 arrangements either using pt's VA benefits or using his Medicare plan. Pt states he would like the portable O2 concentrator if possible. He states that if the [place] won't cover that, he would consider using his [employer] Medicare if the co-pays are within his budget. Pt aware that TOC will start looking into this and update him when further information is available.  Awaiting return call from the [place] at this time. TOC will follow.   Expected Discharge Plan: Home/Self Care Barriers to Discharge: Continued Medical Work up   Patient Goals and CMS Choice Patient states their goals for this hospitalization and ongoing recovery are:: go home CMS Medicare.gov Compare Post Acute Care list provided to:: Patient Choice offered to / list presented to : Patient      Expected Discharge Plan and Services In-house Referral: Clinical Social Work   Post Acute Care Choice: Durable Medical Equipment Living arrangements for the past 2 months: Single Family Home                                      Prior Living Arrangements/Services Living arrangements for the past 2 months: Single Family Home Lives with:: Significant Other Patient language  and need for interpreter reviewed:: Yes Do you feel safe going back to the place where you live?: Yes      Need for Family Participation in Patient Care: No (Comment)     Criminal Activity/Legal Involvement Pertinent to Current Situation/Hospitalization: No - Comment as needed  Activities of Daily Living Home Assistive Devices/Equipment: None ADL Screening (condition at time of admission) Patient's cognitive ability adequate to safely complete daily activities?: Yes Is the patient deaf or have difficulty hearing?: Yes Does the patient have difficulty seeing, even when wearing glasses/contacts?: Yes Does the patient have difficulty concentrating, remembering, or making decisions?: No Patient able to express need for assistance with ADLs?: Yes Does the patient have difficulty dressing or bathing?: No Independently performs ADLs?: Yes (appropriate for developmental age) Does the patient have difficulty walking or climbing stairs?: No Weakness of Legs: None Weakness of Arms/Hands: None  Permission Sought/Granted Permission sought to share information with : Oceanographer granted to share information with : Yes, Verbal Permission Granted     Permission granted to share info w AGENCY: [facility], [employer]        Emotional Assessment Appearance:: Appears stated age Attitude/Demeanor/Rapport: Engaged Affect (typically observed): Pleasant Orientation: : Oriented to Self, Oriented to Place, Oriented to  Time,  Oriented to Situation Alcohol / Substance Use: Not Applicable Psych Involvement: No (comment)  Admission diagnosis:  Atrial fibrillation with RVR [I48.91] COPD exacerbation [J44.1] Patient Active Problem List   Diagnosis Date Noted   Shortness of breath 08/14/2022   Lobar pneumonia 08/13/2022   COPD exacerbation 08/13/2022   Acute respiratory failure with hypoxia 08/12/2022   Atrial fibrillation with RVR 05/18/2021   Atrial fibrillation with rapid  ventricular response 05/17/2021   Renal transplant recipient 05/17/2021   HTN (hypertension) 05/17/2021   COPD with acute exacerbation 05/17/2021   COVID-19 virus infection 05/17/2021   PCP:  Clinic, [facility] Pharmacy:   [facility] - [place], [place] - [address] [place] [address] Phone: [phone] Fax: [phone]     Social Determinants of Health (SDOH) Social History: SDOH Screenings   Food Insecurity: No Food Insecurity (08/12/2022)  Housing: Low Risk  (08/12/2022)  Transportation Needs: No Transportation Needs (08/12/2022)  Utilities: Not At Risk (08/12/2022)  Tobacco Use: High Risk (08/16/2022)   SDOH Interventions:     Readmission Risk Interventions    08/17/2022   11:20 AM  Readmission Risk Prevention Plan  Transportation Screening Complete  Home Care Screening Complete  Medication Review (RN CM) Complete

## 2022-08-17 NOTE — Interval H&P Note (Signed)
History and Physical Interval Note:  08/17/2022 1:17 PM  Calvin Carroll  has presented today for DCCV for intermittent atrial flutter with RVR.  The various methods of treatment have been discussed with the patient and family. After consideration of risks, benefits and other options for treatment, the patient has consented to  Procedure(s): CARDIOVERSION (N/A) as a surgical intervention.  The patient's history has been reviewed, patient examined, no change in status, stable for surgery.  I have reviewed the patient's chart and labs.  Questions were answered to the patient's satisfaction.    Patient did not miss any doses of anticoagulation in the last 3 weeks. Confirmed with the patient.   Shloma Roggenkamp Norton Pastel, MD [facility]

## 2022-08-17 NOTE — Progress Notes (Addendum)
Rounding Note    Patient Name: Calvin Carroll Date of Encounter: 08/17/2022  [facility] Cardiologist: Kathryne Sharper [facility] EP: Dr. Lalla Brothers  Subjective   Feels that his breathing has significantly improved. Had tachycardia overnight with HR in the 140's to 150's and was started on IV Cardizem. Denies any chest pain or palpitations at this time.   Inpatient Medications    Scheduled Meds:  albuterol  2.5 mg Nebulization TID   apixaban  5 mg Oral BID   arformoterol  15 mcg Nebulization BID   atorvastatin  40 mg Oral QHS   azaTHIOprine  50 mg Oral Daily   budesonide (PULMICORT) nebulizer solution  0.5 mg Nebulization BID   Chlorhexidine Gluconate Cloth  6 each Topical Daily   cyanocobalamin  1,000 mcg Oral Daily   doxycycline  100 mg Oral Q12H   hydrALAZINE  25 mg Oral Q8H   methylPREDNISolone (SOLU-MEDROL) injection  40 mg Intravenous Q12H   metoprolol tartrate  50 mg Oral TID   pantoprazole  40 mg Oral QAC supper   polyethylene glycol  17 g Oral Daily   revefenacin  175 mcg Nebulization Daily   senna  2 tablet Oral Daily   tacrolimus  1.5 mg Oral Daily   And   tacrolimus  2 mg Oral QHS   Continuous Infusions:  cefTRIAXone (ROCEPHIN)  IV Stopped (08/16/22 1358)   diltiazem (CARDIZEM) infusion 10 mg/hr (08/17/22 0741)   PRN Meds: acetaminophen **OR** acetaminophen, guaiFENesin-dextromethorphan, hydrALAZINE, HYDROcodone bit-homatropine, hydrOXYzine, ondansetron **OR** ondansetron (ZOFRAN) IV, phenol   Vital Signs    Vitals:   08/17/22 0645 08/17/22 0700 08/17/22 0715 08/17/22 0728  BP: 128/76 129/75 137/77   Pulse: 72 73 71 72  Resp: 15 16 14 14   Temp:      TempSrc:      SpO2: 97% 97% 96% 98%  Weight:      Height:        Intake/Output Summary (Last 24 hours) at 08/17/2022 0747 Last data filed at 08/17/2022 0506 Gross per 24 hour  Intake 602.56 ml  Output 1595 ml  Net -992.44 ml      08/17/2022    5:06 AM 08/15/2022    5:00 AM 08/14/2022    5:00 AM   Last 3 Weights  Weight (lbs) 108 lb 0.4 oz 105 lb 2.6 oz 104 lb 15 oz  Weight (kg) 49 kg 47.7 kg 47.6 kg      Telemetry    Atrial flutter with RVR around 2300 with rates in the 140's, improved into the 70's currently.  - Personally Reviewed  ECG    Atrial flutter with 4:1 conduction, HR 67 with RBBB.   - Personally Reviewed  Physical Exam   GEN: Pleasant, thin male appearing in no acute distress.   Neck: No JVD Cardiac: Irregularly irregular, 2/6 SEM along RUSB.  Respiratory: Clear to auscultation bilaterally. GI: Soft, nontender, non-distended  MS: No pitting edema; No deformity. Neuro:  Nonfocal  Psych: Normal affect   Labs    High Sensitivity Troponin:  No results for input(s): "TROPONINIHS" in the last 720 hours.   Chemistry Recent Labs  Lab 08/13/22 1257 08/14/22 0343 08/15/22 0513 08/16/22 0250 08/17/22 0448  NA 130* 128* 127* 127* 128*  K 4.6 4.9 5.4* 5.0 5.0  CL 97* 96* 96* 94* 94*  CO2 21* 23 23 24 25   GLUCOSE 236* 194* 149* 209* 162*  BUN 46* 49* 57* 63* 74*  CREATININE 1.44* 1.36* 1.57* 1.69* 2.01*  CALCIUM 8.8* 9.0 9.0 8.8* 8.8*  MG 1.9 1.9 1.8  --   --   GFRNONAA 54* 58* 49* 44* 36*  ANIONGAP Lipids No results for input(s): "CHOL", "TRIG", "HDL", "LABVLDL", "LDLCALC", "CHOLHDL" in the last 168 hours.  Hematology Recent Labs  Lab 08/15/22 0513 08/16/22 0250 08/17/22 0448  WBC 10.7* 9.4 8.7  RBC 3.70* 3.87* 3.97*  HGB 11.7* 12.2* 12.6*  HCT 34.9* 36.2* 36.7*  MCV 94.3 93.5 92.4  MCH 31.6 31.5 31.7  MCHC 33.5 33.7 34.3  RDW 13.8 13.8 13.6  PLT 243 230 236   Thyroid  Recent Labs  Lab 08/15/22 0513  TSH 0.365    BNP Recent Labs  Lab 08/12/22 1124 08/14/22 1500  BNP 1,240.0* 678.0*    DDimer No results for input(s): "DDIMER" in the last 168 hours.   Radiology     Cardiac Studies   Echocardiogram: 08/15/2022 IMPRESSIONS     1. Left ventricular ejection fraction, by estimation, is 60 to 65%. The  left  ventricle has normal function. The left ventricle has no regional  wall motion abnormalities. Left ventricular diastolic function could not  be evaluated due to atrial arrythmia.   The E/e' is 26.9.   2. Right ventricular systolic function is normal. The right ventricular  size is normal. Tricuspid regurgitation signal is inadequate for assessing  PA pressure.   3. Left atrial size was severely dilated.   4. Right atrial size was moderately dilated.   5. The mitral valve is abnormal. Trivial mitral valve regurgitation. No  evidence of mitral stenosis. Moderate to severe mitral annular  calcification.   6. The aortic valve was not well visualized. Visually, aortic valve  regurgitation is mild but PHT measures 364 msec likely from diastolic  dysfunction. Mild aortic valve stenosis. Aortic valve area, by VTI  measures 1.03 cm. Aortic valve mean gradient  measures 14.0 mmHg. Aortic valve Vmax measures 2.43 m/s.   7. Aortic dilatation noted. There is borderline dilatation of the aortic  root, measuring 37 mm.   8. The inferior vena cava is dilated in size with <50% respiratory  variability, suggesting right atrial pressure of 15 mmHg.   9. Evidence of atrial level shunting detected by color flow Doppler.  There is a small patent foramen ovale with predominantly left to right  shunting across the atrial septum.   Comparison(s): No significant change from prior study.   Patient Profile     67 y.o. 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   Assessment & Plan    1. History of Atrial Fibrillation/Atrial Flutter with RVR - Diagnosed with atypical atrial flutter with RVR this admission and was on PO Lopressor  TID but required initiation of IV Cardizem  overnight due to elevated HR. Rates now improved into the 70's. He is scheduled for DCCV today. Risks and benefits reviewed and he is in agreement to proceed. He has been NPO since midnight. Would likely monitor overnight with possible discharge tomorrow given his multiple medical issues. Will need OSA evaluation as an outpatient. Not on antiarrhythmics as he is scheduled for an ablation in 10/2022. - Remains on Eliquis  BID for anticoagulation. Given his weight of 49 kg, will need to reduce  dosing to 2.5mg  BID if creatinine remains > 1.5.   2. Acute HFpEF - BNP was elevated to 1240 on admission and had improved to 678 when rechecked on 4/21. He received IV Lasix  BID yesterday and given his worsening renal function, will hold for now. Recorded output of -2.1 L thus far (-992 mL yesterday).   3. AKI/History of Renal Transplant - Creatinine was at 0.96 in 04/2022 by review of Care Everywhere. At 1.52 on admission and trending up to 2.01 today. Will hold additional diuretics. Repeat BMET in AM.   4. HTN - SBP was elevated into the 220's yesterday, BP improved to 140/98 currently. He is receiving Hydralazine  TID, Lopressor  TID and IV Cardizem. Will plan to stop Cardizem following DCCV. Can titrate Hydralazine pending BP trend. Was on Lisinopril  daily at home but would hold at this time given his AKI.   5. HLD - He has been continued on Atorvastatin  daily.   6. Hyponatremia - Na+ at 127 yesterday, at 128 today. Continue to follow. Holding Lasix today as described above.   7. Aortic Stenosis and Regurgitation - Echo this admission shows mild AI and mild AS with a small PFO as well. Continue to follow as an outpatient.   8. Lobar PNA - Noted on CT this admission and receiving Ceftriaxone and Doxycycline. Management per the admitting team.    For questions or updates, please contact [facility] Please consult [email or web address] for contact info under         Signed, Ellsworth Lennox, PA-C  08/17/2022, 7:47 AM     Patient is NPO for DCCV today. He had atrial flutter with RVR episode last night with heart rates as high as 150s due to which diltiazem drip was restarted. Continues to be in atrial flutter, but rate controlled.  He denies any symptoms of SOB. Physical examination is remarkable for patient not in acute respite distress, HEENT normal, JVD unable to examine due to facial hair, S1-S2 normal, ejection systolic murmur present, clear lungs, nondistended and nontender abdomen, nonpitting edema in bilateral lower EXTR. Serum creatinine increased from 1.6-2, will hold diuresis.  N.p.o. for DCCV, will follow-up after DCCV.  Per EP, okay to use amiodarone however will check with pharmacy to make sure it has no interactions with the immunosuppressants he takes.  I have spent a total of 30 minutes with patient reviewing chart , telemetry, EKGs, labs and examining patient as well as establishing an assessment and plan that was discussed with the patient.  > 50% of time was spent in direct patient care.    Jasin Brazel Verne Spurr, MD [place]  [facility]  10:12 AM

## 2022-08-17 NOTE — Progress Notes (Signed)
PROGRESS NOTE  Calvin Carroll ZOX:[id number] DOB: 04-24-1956 DOA: 08/12/2022 PCP: [facility]  Brief History:  67 year old male with a history of renal transplant x 2 in 2003 and 2011, COPD, tobacco abuse, coronary artery disease with history of MI, atrial fibrillation, and hyperlipidemia presenting with 2-week history of shortness of breath, coughing, and chest congestion.  He states that he has been coughing up some yellow sputum, but denies any hemoptysis.  He has had some subjective sweats but has not taken his temperature.  Notably, the patient has a portable heart monitor that he has been using and has noted that he has had atrial fibrillation with RVR in the past 4 days.  He states that his blood pressure has been running soft with systolic BP in the low 100s.  He denies any syncope, but feels tired and short of breath.  He states that he did have some chest discomfort when he was having RVR with his atrial fibrillation.  At present, the patient denies any chest discomfort, nausea, vomiting or direct abdominal pain.  He continues to smoke slightly over 1 pack/day.  He states that he last picked up a cigarette on 08/08/2022.  Because of his atrial fibrillation and rapid heart rate he presented for further evaluation and treatment.  He denies any worsening lower extremity edema or orthopnea. In the ED, the patient was afebrile hemodynamically stable with oxygen saturation 90% on room air.  WBC 10.5, hemoglobin 14.0, platelets 199,000.  Sodium 132, potassium 4.1, bicarbonate 24, serum creatinine 1.52.  COVID-19 PCR was negative.  BNP 1240.  Chest x-ray showed chronic interstitial markings and hyperinflation.  EKG showed atrial flutter with right bundle branch block.  The patient was started on diltiazem.  Cardiology was consulted to assist with management.  The patient subsequently developed RVR.  He was started on diltiazem drip.  His rate was controlled, but remained in atrial  flutter.  Cardiology performed DCCV cardioversion on 08/17/2022 after which pt was in sinus rhythm.   Assessment/Plan: Acute respiratory failure with hypoxia -Secondary COPD exacerbation -Initially placed on 2 L nasal cannula -Presented with hypoxia and tachypnea -Wean oxygen for saturation greater 92% -08/02/2022 CT chest--advanced emphysema; ill-defined opacity and reticular nodular change in the RLL>RML; RML opacities are new; mildly enlarged mediastinal lymph nodes -4/21 ReDS reading--57 -echo EF 60-65%, no WMA, normal RVF -08/17/22--weaned to RA after DCCV    COPD exacerbation -continue IV Solu-Medrol>>transition to po prednisone 4/24 -continue Pulmicort -Continue on duonebs -Continue Brovana -COVID/Flu/RSV neg -viral resp panel--neg -continue yupelri   Acute on chronic HFpEF -appreciate cardiology -continue IV lasix--last dose 4/23>>held now due to uptrend serum creatinine -weight 109.5>>105.1 lbs   Lobar pneumonia --08/02/2022 CT chest--advanced emphysema; ill-defined opacity and reticular nodular change in the RLL>RML; RML opacities are new; mildly enlarged mediastinal lymph nodes -continue ceftriaxone -Continue doxycycline -PCT 0.26>>0.13   Atrial fibrillation/flutter with RVR, type unspecified -pt states he is due to ablation on 11/07/22 -appreciate cardiology consult -restart metoprolol--increased to 3 times daily -continue diltiazem gtt>>now rate controlled, remains in a flutter -continue apixaban -obtain echo---echo EF 60-65%, no WMA, normal RVF -DCCV 4/24>>back to sinus   Renal transplant recipient -continue tacrolimus and azathioprine -no longer taking mycophenolate -on IV solumedrol so hold baseline prednisone   Tobacco abuse -tobacco cessation discussed   Hyperlipidemia -continue statin   CAD/elevated troponin -Secondary to demand ischemia -no chest pain presently -continue statin, metoprolol, apixaban   Hyponatremia -improving with  diuresis               Family Communication:   Significant other at bedside 4/20   Consultants: Cardiology   Code Status:  FULL    DVT Prophylaxis: Apixaban     Procedures: As Listed in Progress Note Above   Antibiotics: Ceftriaxone 4/20>> Doxycycline 4/19>>            Subjective: Pt states his breathing is better post DCCV.  Denies f/c, cp, n/v/d, abd pain  Objective: Vitals:   08/17/22 1509 08/17/22 1530 08/17/22 1534 08/17/22 1537  BP: (!) 157/79 (!) 166/90    Pulse: (!) 42 71 64 64  Resp: (!) 21 (!) 27 19 19   Temp:      TempSrc:      SpO2: 96% (!) 89% (!) 89% 94%  Weight:      Height:        Intake/Output Summary (Last 24 hours) at 08/17/2022 1548 Last data filed at 08/17/2022 1452 Gross per 24 hour  Intake 415.45 ml  Output 1175 ml  Net -759.55 ml   Weight change:  Exam:  General:  Pt is alert, follows commands appropriately, not in acute distress HEENT: No icterus, No thrush, No neck mass, [place]/AT Cardiovascular: RRR, S1/S2, no rubs, no gallops Respiratory: bibasilar rales.  No wheeze Abdomen: Soft/+BS, non tender, non distended, no guarding Extremities: No edema, No lymphangitis, No petechiae, No rashes, no synovitis   Data Reviewed: I have personally reviewed following labs and imaging studies Basic Metabolic Panel: Recent Labs  Lab 08/12/22 1124 08/13/22 1257 08/14/22 0343 08/15/22 0513 08/16/22 0250 08/17/22 0448  NA 132* 130* 128* 127* 127* 128*  K 4.1 4.6 4.9 5.4* 5.0 5.0  CL 97* 97* 96* 96* 94* 94*  CO2 24 21* 23 23 24 25   GLUCOSE 190* 236* 194* 149* 209* 162*  BUN 41* 46* 49* 57* 63* 74*  CREATININE 1.52* 1.44* 1.36* 1.57* 1.69* 2.01*  CALCIUM 9.0 8.8* 9.0 9.0 8.8* 8.8*  MG 1.9 1.9 1.9 1.8  --   --    Liver Function Tests: No results for input(s): "AST", "ALT", "ALKPHOS", "BILITOT", "PROT", "ALBUMIN" in the last 168 hours. No results for input(s): "LIPASE", "AMYLASE" in the last 168 hours. No results for input(s): "AMMONIA" in the last  168 hours. Coagulation Profile: No results for input(s): "INR", "PROTIME" in the last 168 hours. CBC: Recent Labs  Lab 08/13/22 1257 08/14/22 0343 08/15/22 0513 08/16/22 0250 08/17/22 0448  WBC 9.3 10.7* 10.7* 9.4 8.7  HGB 12.2* 11.8* 11.7* 12.2* 12.6*  HCT 35.8* 35.2* 34.9* 36.2* 36.7*  MCV 93.7 94.6 94.3 93.5 92.4  PLT 213 221 243 230 236   Cardiac Enzymes: No results for input(s): "CKTOTAL", "CKMB", "CKMBINDEX", "TROPONINI" in the last 168 hours. BNP: Invalid input(s): "POCBNP" CBG: No results for input(s): "GLUCAP" in the last 168 hours. HbA1C: No results for input(s): "HGBA1C" in the last 72 hours. Urine analysis:    Component Value Date/Time   COLORURINE AMBER (A) 12/03/2016 1026   APPEARANCEUR CLEAR 12/03/2016 1026   LABSPEC 1.023 12/03/2016 1026   PHURINE 5.0 12/03/2016 1026   GLUCOSEU NEGATIVE 12/03/2016 1026   HGBUR SMALL (A) 12/03/2016 1026   BILIRUBINUR NEGATIVE 12/03/2016 1026   KETONESUR 5 (A) 12/03/2016 1026   PROTEINUR >=300 (A) 12/03/2016 1026   NITRITE NEGATIVE 12/03/2016 1026   LEUKOCYTESUR NEGATIVE 12/03/2016 1026   Sepsis Labs: @LABRCNTIP (procalcitonin:4,lacticidven:4) ) Recent Results (from the past 240 hour(s))  Resp panel by RT-PCR (RSV, Flu  A&B, Covid) Anterior Nasal Swab     Status: None   Collection Time: 08/12/22 11:34 AM   Specimen: Anterior Nasal Swab  Result Value Ref Range Status   SARS Coronavirus 2 by RT PCR NEGATIVE NEGATIVE Final    Comment: (NOTE) SARS-CoV-2 target nucleic acids are NOT DETECTED.  The SARS-CoV-2 RNA is generally detectable in upper respiratory specimens during the acute phase of infection. The lowest concentration of SARS-CoV-2 viral copies this assay can detect is 138 copies/mL. A negative result does not preclude SARS-Cov-2 infection and should not be used as the sole basis for treatment or other patient management decisions. A negative result may occur with  improper specimen collection/handling,  submission of specimen other than nasopharyngeal swab, presence of viral mutation(s) within the areas targeted by this assay, and inadequate number of viral copies(<138 copies/mL). A negative result must be combined with clinical observations, patient history, and epidemiological information. The expected result is Negative.  Fact Sheet for Patients:  BloggerCourse.com  Fact Sheet for Healthcare Providers:  SeriousBroker.it  This test is no t yet approved or cleared by the [place] FDA and  has been authorized for detection and/or diagnosis of SARS-CoV-2 by FDA under an Emergency Use Authorization (EUA). This EUA will remain  in effect (meaning this test can be used) for the duration of the COVID-19 declaration under Section 564(b)(1) of the Act, 21 U.S.C.section 360bbb-3(b)(1), unless the authorization is terminated  or revoked sooner.       Influenza A by PCR NEGATIVE NEGATIVE Final   Influenza B by PCR NEGATIVE NEGATIVE Final    Comment: (NOTE) The Xpert Xpress SARS-CoV-2/FLU/RSV plus assay is intended as an aid in the diagnosis of influenza from Nasopharyngeal swab specimens and should not be used as a sole basis for treatment. Nasal washings and aspirates are unacceptable for Xpert Xpress SARS-CoV-2/FLU/RSV testing.  Fact Sheet for Patients: BloggerCourse.com  Fact Sheet for Healthcare Providers: SeriousBroker.it  This test is not yet approved or cleared by the [place] FDA and has been authorized for detection and/or diagnosis of SARS-CoV-2 by FDA under an Emergency Use Authorization (EUA). This EUA will remain in effect (meaning this test can be used) for the duration of the COVID-19 declaration under Section 564(b)(1) of the Act, 21 U.S.C. section 360bbb-3(b)(1), unless the authorization is terminated or revoked.     Resp Syncytial Virus by PCR NEGATIVE  NEGATIVE Final    Comment: (NOTE) Fact Sheet for Patients: BloggerCourse.com  Fact Sheet for Healthcare Providers: SeriousBroker.it  This test is not yet approved or cleared by the [place] FDA and has been authorized for detection and/or diagnosis of SARS-CoV-2 by FDA under an Emergency Use Authorization (EUA). This EUA will remain in effect (meaning this test can be used) for the duration of the COVID-19 declaration under Section 564(b)(1) of the Act, 21 U.S.C. section 360bbb-3(b)(1), unless the authorization is terminated or revoked.  Performed at [facility], [address] [place], [place] [address]   Respiratory (~20 pathogens) panel by PCR     Status: None   Collection Time: 08/12/22  3:52 PM   Specimen: Nasopharyngeal Swab; Respiratory  Result Value Ref Range Status   Adenovirus NOT DETECTED NOT DETECTED Final   Coronavirus 229E NOT DETECTED NOT DETECTED Final    Comment: (NOTE) The Coronavirus on the Respiratory Panel, DOES NOT test for the novel  Coronavirus (2019 nCoV)    Coronavirus HKU1 NOT DETECTED NOT DETECTED Final   Coronavirus NL63 NOT DETECTED NOT DETECTED  Final   Coronavirus OC43 NOT DETECTED NOT DETECTED Final   Metapneumovirus NOT DETECTED NOT DETECTED Final   Rhinovirus / Enterovirus NOT DETECTED NOT DETECTED Final   Influenza A NOT DETECTED NOT DETECTED Final   Influenza B NOT DETECTED NOT DETECTED Final   Parainfluenza Virus 1 NOT DETECTED NOT DETECTED Final   Parainfluenza Virus 2 NOT DETECTED NOT DETECTED Final   Parainfluenza Virus 3 NOT DETECTED NOT DETECTED Final   Parainfluenza Virus 4 NOT DETECTED NOT DETECTED Final   Respiratory Syncytial Virus NOT DETECTED NOT DETECTED Final   Bordetella pertussis NOT DETECTED NOT DETECTED Final   Bordetella Parapertussis NOT DETECTED NOT DETECTED Final   Chlamydophila pneumoniae NOT DETECTED NOT DETECTED Final   Mycoplasma pneumoniae NOT DETECTED NOT  DETECTED Final    Comment: Performed at [facility], [address] [place], [place] [address]  MRSA Next Gen by PCR, Nasal     Status: None   Collection Time: 08/12/22  3:52 PM   Specimen: Nasal Mucosa; Nasal Swab  Result Value Ref Range Status   MRSA by PCR Next Gen NOT DETECTED NOT DETECTED Final    Comment: (NOTE) The GeneXpert MRSA Assay (FDA approved for NASAL specimens only), is one component of a comprehensive MRSA colonization surveillance program. It is not intended to diagnose MRSA infection nor to guide or monitor treatment for MRSA infections. Test performance is not FDA approved in patients less than 67 years old. Performed at [facility], [address] [place], [place] [address]      Scheduled Meds:  albuterol  2.5 mg Nebulization TID   apixaban  5 mg Oral BID   arformoterol  15 mcg Nebulization BID   atorvastatin  40 mg Oral QHS   azaTHIOprine  50 mg Oral Daily   budesonide (PULMICORT) nebulizer solution  0.5 mg Nebulization BID   Chlorhexidine Gluconate Cloth  6 each Topical Daily   cyanocobalamin  1,000 mcg Oral Daily   doxycycline  100 mg Oral Q12H   hydrALAZINE  25 mg Oral Q8H   hydrocortisone cream   Topical TID   metoprolol tartrate  50 mg Oral TID   pantoprazole  40 mg Oral QAC supper   polyethylene glycol  17 g Oral Daily   [START ON 08/18/2022] predniSONE  50 mg Oral Q breakfast   revefenacin  175 mcg Nebulization Daily   senna  2 tablet Oral Daily   tacrolimus  1.5 mg Oral Daily   And   tacrolimus  2 mg Oral QHS   Continuous Infusions:  cefTRIAXone (ROCEPHIN)  IV Stopped (08/16/22 1358)   diltiazem (CARDIZEM) infusion Stopped (08/17/22 1453)    Procedures/Studies: ECHOCARDIOGRAM COMPLETE  Result Date: 08/15/2022    ECHOCARDIOGRAM REPORT   Patient Name:   MARQUAY KRUSE Date of Exam: 08/15/2022 Medical Rec #:  [id number]        Height:       68.0 in Accession #:    [id number]       Weight:       105.2 lb Date of Birth:  Aug 26, 1955        BSA:          1.555 m Patient Age:    66 years         BP:           151/90 mmHg Patient Gender: M                HR:  67 bpm. [facility] Procedure: 2D Echo, Cardiac Doppler and Color Doppler Indications:    Atrial Fibrillation I48.91  History:        Patient has prior history of Echocardiogram examinations, most                 recent 05/18/2021. Previous Myocardial Infarction, COPD,                 Arrythmias:Atrial Fibrillation; Risk Factors:Hypertension. Hx of                 COVID-19 virus infection.  Sonographer:    Celesta Gentile RCS Referring Phys: [id number] Lemmie Vanlanen IMPRESSIONS  1. Left ventricular ejection fraction, by estimation, is 60 to 65%. The left ventricle has normal function. The left ventricle has no regional wall motion abnormalities. Left ventricular diastolic function could not be evaluated due to atrial arrythmia.  The E/e' is 26.9.  2. Right ventricular systolic function is normal. The right ventricular size is normal. Tricuspid regurgitation signal is inadequate for assessing PA pressure.  3. Left atrial size was severely dilated.  4. Right atrial size was moderately dilated.  5. The mitral valve is abnormal. Trivial mitral valve regurgitation. No evidence of mitral stenosis. Moderate to severe mitral annular calcification.  6. The aortic valve was not well visualized. Visually, aortic valve regurgitation is mild but PHT measures 364 msec likely from diastolic dysfunction. Mild aortic valve stenosis. Aortic valve area, by VTI measures 1.03 cm. Aortic valve mean gradient measures 14.0 mmHg. Aortic valve Vmax measures 2.43 m/s.  7. Aortic dilatation noted. There is borderline dilatation of the aortic root, measuring 37 mm.  8. The inferior vena cava is dilated in size with <50% respiratory variability, suggesting right atrial pressure of 15 mmHg.  9. Evidence of atrial level shunting detected by color flow Doppler. There is a small patent foramen ovale with predominantly  left to right shunting across the atrial septum. Comparison(s): No significant change from prior study. FINDINGS  Left Ventricle: Left ventricular ejection fraction, by estimation, is 60 to 65%. The left ventricle has normal function. The left ventricle has no regional wall motion abnormalities. The left ventricular internal cavity size was normal in size. There is  no left ventricular hypertrophy. Left ventricular diastolic function could not be evaluated due to atrial fibrillation. Left ventricular diastolic function could not be evaluated. The E/e' is 26.9. Right Ventricle: The right ventricular size is normal. No increase in right ventricular wall thickness. Right ventricular systolic function is normal. Tricuspid regurgitation signal is inadequate for assessing PA pressure. Left Atrium: Left atrial size was severely dilated. Right Atrium: Right atrial size was moderately dilated. Pericardium: There is no evidence of pericardial effusion. Mitral Valve: The mitral valve is abnormal. Moderate to severe mitral annular calcification. Trivial mitral valve regurgitation. No evidence of mitral valve stenosis. Tricuspid Valve: The tricuspid valve is normal in structure. Tricuspid valve regurgitation is trivial. No evidence of tricuspid stenosis. Aortic Valve: The aortic valve was not well visualized. Aortic valve regurgitation is mild. Aortic regurgitation PHT measures 364 msec. Mild aortic stenosis is present. Aortic valve mean gradient measures 14.0 mmHg. Aortic valve peak gradient measures 23.6 mmHg. Aortic valve area, by VTI measures 1.03 cm. Pulmonic Valve: The pulmonic valve was not well visualized. Pulmonic valve regurgitation is not visualized. No evidence of pulmonic stenosis. Aorta: Aortic dilatation noted. There is borderline dilatation of the aortic root, measuring 37 mm. Venous: The inferior vena cava is dilated in  size with less than 50% respiratory variability, suggesting right atrial pressure of 15  mmHg. IAS/Shunts: Evidence of atrial level shunting detected by color flow Doppler. A small patent foramen ovale is detected with predominantly left to right shunting across the atrial septum.  LEFT VENTRICLE PLAX 2D LVIDd:         3.90 cm   Diastology LVIDs:         2.60 cm   LV e' medial:    6.20 cm/s LV PW:         0.90 cm   LV E/e' medial:  26.9 LV IVS:        1.20 cm   LV e' lateral:   7.94 cm/s LVOT diam:     1.90 cm   LV E/e' lateral: 21.0 LV SV:         58 LV SV Index:   37 LVOT Area:     2.84 cm  RIGHT VENTRICLE RV S prime:     12.20 cm/s TAPSE (M-mode): 2.0 cm LEFT ATRIUM              Index        RIGHT ATRIUM           Index LA diam:        4.20 cm  2.70 cm/m   RA Area:     24.00 cm LA Vol (A2C):   101.0 ml 64.95 ml/m  RA Volume:   74.50 ml  47.91 ml/m LA Vol (A4C):   102.0 ml 65.59 ml/m LA Biplane Vol: 103.0 ml 66.23 ml/m  AORTIC VALVE AV Area (Vmax):    0.94 cm AV Area (Vmean):   0.94 cm AV Area (VTI):     1.03 cm AV Vmax:           243.00 cm/s AV Vmean:          181.000 cm/s AV VTI:            0.564 m AV Peak Grad:      23.6 mmHg AV Mean Grad:      14.0 mmHg LVOT Vmax:         80.40 cm/s LVOT Vmean:        60.300 cm/s LVOT VTI:          0.204 m LVOT/AV VTI ratio: 0.36 AI PHT:            364 msec  AORTA Ao Root diam: 3.70 cm MITRAL VALVE MV Area (PHT): 5.54 cm     SHUNTS MV Decel Time: 137 msec     Systemic VTI:  0.20 m MV E velocity: 167.00 cm/s  Systemic Diam: 1.90 cm MV A velocity: 52.80 cm/s MV E/A ratio:  3.16 Vishnu Priya Mallipeddi Electronically signed by Winfield Rast Mallipeddi Signature Date/Time: 08/15/2022/3:06:35 PM    Final    DG Chest 1 View  Result Date: 08/14/2022 CLINICAL DATA:  Shortness of breath EXAM: CHEST  1 VIEW COMPARISON:  08/12/2022 FINDINGS: Bilateral interstitial opacities with mild hyperinflation. Normal cardiomediastinal contours and pleural spaces. IMPRESSION: Bilateral interstitial opacities with mild hyperinflation, likely due to COPD. Electronically  Signed   By: Deatra Robinson M.D.   On: 08/14/2022 19:55   CT CHEST WO CONTRAST  Result Date: 08/12/2022 CLINICAL DATA:  Dyspnea.  Chronic cough.  COPD. EXAM: CT CHEST WITHOUT CONTRAST TECHNIQUE: Multidetector CT imaging of the chest was performed following the standard protocol without IV contrast. RADIATION DOSE REDUCTION: This exam was performed according to the  departmental dose-optimization program which includes automated exposure control, adjustment of the mA and/or kV according to patient size and/or use of iterative reconstruction technique. COMPARISON:  X-ray 08/12/2022 and older.  CT angiogram 05/17/2021 FINDINGS: Cardiovascular: Trace pericardial fluid. Heart is nonenlarged. Coronary artery calcifications are seen. There also calcifications along the area of the aortic valve and mitral valve annulus. The thoracic aorta on this noncontrast exam has a normal course and caliber with prominent calcified plaque including along the origin of the great vessels, in particular the left subclavian artery. Mediastinum/Nodes: No specific abnormal lymph node enlargement identified in the axillary region or hilum. There are some prominent to mildly enlarged mediastinal nodes however for example right paratracheal posteriorly along the upper mediastinum on series 2, image 28 measuring 17 by 9 mm, precarinal on series 2, image 65 measuring 2.2 by 1.1 cm. These are unchanged from the previous examination. Other nodes are present as well left paratracheal, subcarinal. There are some lymph nodes in the mediastinum and hila with calcifications. Lungs/Pleura: Centrilobular emphysematous lung changes are identified. There are calcified lung nodules bilaterally consistent with old granulomatous disease. No pneumothorax or effusion. There is some ill-defined reticular parenchymal opacities identified in the right lower lobe, middle lobe and lingula. Areas in the right lower lobe and lingula were seen on the previous  examination could be more chronic. There are some associated areas of bronchial wall thickening. The dependent ill-defined areas in the middle lobe are new from the examination of 2023. There is a noncalcified nodule measuring 4 mm in the inferior aspect of the right upper lobe on series 4, image 73 which is stable on the prior and by report been stable since 2018 demonstrating long-term stability. Upper Abdomen: Along the upper abdomen the kidneys are severely atrophic. Slight thickening of the left adrenal gland is stable. Musculoskeletal: Mild degenerative changes are seen along the spine. IMPRESSION: Advanced emphysematous lung changes. There is some ill-defined opacity and reticular changes involving the right lower lobe greater than middle lobe and lingula. The areas in the right lower lobe and lingula were seen on the study of 2023. This could be chronic versus a recurrent acute process. The areas in the middle lobe were not seen at the time. Again an acute process is possible and recommend follow-up to confirm clearance. Evidence of old granulomatous disease. Stable mildly enlarged mediastinal lymph nodes Aortic Atherosclerosis (ICD10-I70.0) and Emphysema (ICD10-J43.9). Electronically Signed   By: Karen Kays M.D.   On: 08/12/2022 15:50   DG Chest Port 1 View  Result Date: 08/12/2022 CLINICAL DATA:  Shortness of breath EXAM: PORTABLE CHEST 1 VIEW COMPARISON:  Radiograph 05/26/2021, 05/17/2021 FINDINGS: Unchanged cardiomediastinal silhouette. Emphysema with prominent interstitial opacities and peripheral reticulation, similar to previous exams. No new focal airspace disease. No large effusion or evidence of pneumothorax. IMPRESSION: Emphysema with chronic interstitial changes. No new focal airspace disease. Electronically Signed   By: Caprice Renshaw M.D.   On: 08/12/2022 11:51    Catarina Hartshorn, DO  [facility]  If 7PM-7AM, please contact night-coverage [email or web address] Password [facility] 08/17/2022, 3:48  PM   LOS: 4 days

## 2022-08-17 NOTE — Progress Notes (Signed)
Pt ambulated 2 laps around nursing station, tolerated well. Lowest O2 saturation dropped was 89%, patient did not feel SOB or any distress. Pt wanted to go back to bed, wants to ambulate more later in the day, MD made aware.

## 2022-08-17 NOTE — CV Procedure (Signed)
   DIRECT CURRENT CARDIOVERSION  NAME:  AGNES PROBERT    MRN: [id number] DOB:  04/25/1956    ADMIT DATE: 08/12/2022  INDICATIONS: Symptomatic intermittent atrial flutter with RVR with ADHF  PROCEDURE:  The patient had the defibrillator pads placed in the anterior and posterior position. Once an appropriate level of sedation was confirmed, the patient was cardioverted x 1 with 120J of biphasic synchronized energy. The patient converted to NSR. There were no apparent complications. The patient had normal neuro status and respiratory status post procedure with vitals stable as recorded elsewhere.  Adequate airway was maintained throughout and vital signs monitored per protocol.  RECOMMENDATIONS Post DCCV EKG showed NSR. Stop Diltiazem drip. Decrease dose of Metoprolol tartarate from 50 mg to 25 mg BID. Continue Eliquis 5 mg BID. Cannot start Amiodarone due to drug-drug interactions with Tacrolimus and prolongation of QT interval (per pharmacy). Will refer to Afib clinic for anti-arrhythmic options after discharge.   Collier Bohnet Verne Spurr, MD [place]  [facility]  1:39 PM

## 2022-08-18 ENCOUNTER — Inpatient Hospital Stay (HOSPITAL_COMMUNITY): Payer: No Typology Code available for payment source

## 2022-08-18 DIAGNOSIS — I4891 Unspecified atrial fibrillation: Secondary | ICD-10-CM | POA: Diagnosis not present

## 2022-08-18 DIAGNOSIS — J9601 Acute respiratory failure with hypoxia: Secondary | ICD-10-CM | POA: Diagnosis not present

## 2022-08-18 DIAGNOSIS — I5033 Acute on chronic diastolic (congestive) heart failure: Secondary | ICD-10-CM | POA: Diagnosis not present

## 2022-08-18 DIAGNOSIS — I10 Essential (primary) hypertension: Secondary | ICD-10-CM | POA: Diagnosis not present

## 2022-08-18 DIAGNOSIS — I4892 Unspecified atrial flutter: Secondary | ICD-10-CM | POA: Diagnosis not present

## 2022-08-18 DIAGNOSIS — J441 Chronic obstructive pulmonary disease with (acute) exacerbation: Secondary | ICD-10-CM | POA: Diagnosis not present

## 2022-08-18 LAB — BASIC METABOLIC PANEL
Anion gap: 9 (ref 5–15)
Anion gap: 8 (ref 5–15)
BUN: 90 mg/dL — ABNORMAL HIGH (ref 8–23)
BUN: 90 mg/dL — ABNORMAL HIGH (ref 8–23)
CO2: 26 mmol/L (ref 22–32)
CO2: 25 mmol/L (ref 22–32)
Calcium: 8.9 mg/dL (ref 8.9–10.3)
Calcium: 8.9 mg/dL (ref 8.9–10.3)
Chloride: 96 mmol/L — ABNORMAL LOW (ref 98–111)
Chloride: 97 mmol/L — ABNORMAL LOW (ref 98–111)
Creatinine, Ser: 2.2 mg/dL — ABNORMAL HIGH (ref 0.61–1.24)
Creatinine, Ser: 2.05 mg/dL — ABNORMAL HIGH (ref 0.61–1.24)
GFR, Estimated: 32 mL/min — ABNORMAL LOW (ref >60)
GFR, Estimated: 35 mL/min — ABNORMAL LOW (ref >60)
Glucose, Bld: 107 mg/dL — ABNORMAL HIGH (ref 70–99)
Glucose, Bld: 149 mg/dL — ABNORMAL HIGH (ref 70–99)
Potassium: 4.7 mmol/L (ref 3.5–5.1)
Potassium: 5.2 mmol/L — ABNORMAL HIGH (ref 3.5–5.1)
Sodium: 131 mmol/L — ABNORMAL LOW (ref 135–145)
Sodium: 130 mmol/L — ABNORMAL LOW (ref 135–145)

## 2022-08-18 LAB — CBC
HCT: 38.5 % — ABNORMAL LOW (ref 39.0–52.0)
Hemoglobin: 13.1 g/dL (ref 13.0–17.0)
MCH: 31.6 pg (ref 26.0–34.0)
MCHC: 34 g/dL (ref 30.0–36.0)
MCV: 92.8 fL (ref 80.0–100.0)
Platelets: 224 10*3/uL (ref 150–400)
RBC: 4.15 MIL/uL — ABNORMAL LOW (ref 4.22–5.81)
RDW: 13.4 % (ref 11.5–15.5)
WBC: 10.4 10*3/uL (ref 4.0–10.5)
nRBC: 0.2 % (ref 0.0–0.2)

## 2022-08-18 LAB — PROTEIN / CREATININE RATIO, URINE
Creatinine, Urine: 81 mg/dL
Protein Creatinine Ratio: 0.21 mg/mg{Cre} — ABNORMAL HIGH (ref 0.00–0.15)
Total Protein, Urine: 17 mg/dL

## 2022-08-18 MED ORDER — APIXABAN 2.5 MG PO TABS
2.5000 mg | ORAL_TABLET | Freq: Two times a day (BID) | ORAL | Status: DC
  Administered 2022-08-18 – 2022-08-19 (×3): 2.5 mg via ORAL
  Filled 2022-08-18 (×3): qty 1

## 2022-08-18 MED ORDER — SODIUM CHLORIDE 0.9 % IV BOLUS
250.0000 mL | Freq: Once | INTRAVENOUS | Status: AC
  Administered 2022-08-18: 250 mL via INTRAVENOUS

## 2022-08-18 MED ORDER — SODIUM CHLORIDE 0.9 % IV SOLN: INTRAVENOUS | Status: AC

## 2022-08-18 MED ORDER — LEVALBUTEROL HCL 0.63 MG/3ML IN NEBU: 0.6300 mg | INHALATION_SOLUTION | Freq: Four times a day (QID) | RESPIRATORY_TRACT | Status: DC | PRN

## 2022-08-18 MED ORDER — HYDRALAZINE HCL 25 MG PO TABS
50.0000 mg | ORAL_TABLET | Freq: Three times a day (TID) | ORAL | Status: DC
  Administered 2022-08-18 – 2022-08-19 (×2): 50 mg via ORAL
  Filled 2022-08-18 (×2): qty 2

## 2022-08-18 NOTE — Progress Notes (Addendum)
Rounding Note    Patient Name: Calvin Carroll Date of Encounter: 08/18/2022  [facility] Cardiologist:[facility] EP: Dr. Lalla Brothers  Subjective   Denies any chest pain or palpitations. Breathing at baseline. No complications from DCCV yesterday. Says he is very concerned about his kidney function given his prior transplant. Wants to be restarted on Lisinopril as he says his medications "always get messed up while in the hospital". Reports minimal PO intake this admission due to the food quality.   Inpatient Medications    Scheduled Meds:  apixaban  5 mg Oral BID   arformoterol  15 mcg Nebulization BID   atorvastatin  40 mg Oral QHS   azaTHIOprine  50 mg Oral Daily   budesonide (PULMICORT) nebulizer solution  0.5 mg Nebulization BID   Chlorhexidine Gluconate Cloth  6 each Topical Daily   cyanocobalamin  1,000 mcg Oral Daily   doxycycline  100 mg Oral Q12H   hydrALAZINE  25 mg Oral Q8H   hydrocortisone cream   Topical TID   levalbuterol  0.63 mg Nebulization TID   metoprolol tartrate  50 mg Oral TID   pantoprazole  40 mg Oral QAC supper   polyethylene glycol  17 g Oral Daily   predniSONE  50 mg Oral Q breakfast   revefenacin  175 mcg Nebulization Daily   senna  2 tablet Oral Daily   tacrolimus  1.5 mg Oral Daily   And   tacrolimus  2 mg Oral QHS   Continuous Infusions:  cefTRIAXone (ROCEPHIN)  IV Stopped (08/16/22 1358)   diltiazem (CARDIZEM) infusion Stopped (08/17/22 1453)   PRN Meds: acetaminophen **OR** acetaminophen, guaiFENesin-dextromethorphan, hydrALAZINE, HYDROcodone bit-homatropine, hydrocortisone cream, hydrOXYzine, ondansetron **OR** ondansetron (ZOFRAN) IV, mouth rinse, phenol   Vital Signs    Vitals:   08/18/22 0600 08/18/22 0626 08/18/22 0700 08/18/22 0741  BP: (!) 184/89 (!) 167/85 (!) 177/68   Pulse: 65 65 62 64  Resp: 17 19 18  (!) 21  Temp:      TempSrc:      SpO2: 92% 94% 92% 94%  Weight:      Height:        Intake/Output  Summary (Last 24 hours) at 08/18/2022 0755 Last data filed at 08/17/2022 1900 Gross per 24 hour  Intake 241.17 ml  Output 575 ml  Net -333.83 ml      08/18/2022    5:00 AM 08/17/2022   12:41 PM 08/17/2022    5:06 AM  Last 3 Weights  Weight (lbs) 108 lb 0.4 oz 108 lb 0.4 oz 108 lb 0.4 oz  Weight (kg) 49 kg 49 kg 49 kg      Telemetry    NSR, HR in 60's to 70's.  - Personally Reviewed  ECG    No new tracings.   Physical Exam   GEN: Pleasant male appearing in no acute distress.   Neck: No JVD Cardiac: RRR, 2/6 SEM along RUSB.  Respiratory: Clear to auscultation bilaterally without wheezing or rales. GI: Soft, nontender, non-distended  MS: No pitting edema; No deformity. Neuro:  Nonfocal  Psych: Normal affect   Labs    High Sensitivity Troponin:  No results for input(s): "TROPONINIHS" in the last 720 hours.   Chemistry Recent Labs  Lab 08/13/22 1257 08/14/22 0343 08/15/22 0513 08/16/22 0250 08/17/22 0448 08/18/22 0504  NA 130* 128* 127* 127* 128* 131*  K 4.6 4.9 5.4* 5.0 5.0 4.7  CL 97* 96* 96* 94* 94* 96*  CO2 21* 23  23 24 25 26   GLUCOSE 236* 194* 149* 209* 162* 107*  BUN 46* 49* 57* 63* 74* 90*  CREATININE 1.44* 1.36* 1.57* 1.69* 2.01* 2.20*  CALCIUM 8.8* 9.0 9.0 8.8* 8.8* 8.9  MG 1.9 1.9 1.8  --   --   --   GFRNONAA 54* 58* 49* 44* 36* 32*  ANIONGAP 12 9 8 9 9 9     Lipids No results for input(s): "CHOL", "TRIG", "HDL", "LABVLDL", "LDLCALC", "CHOLHDL" in the last 168 hours.  Hematology Recent Labs  Lab 08/16/22 0250 08/17/22 0448 08/18/22 0504  WBC 9.4 8.7 10.4  RBC 3.87* 3.97* 4.15*  HGB 12.2* 12.6* 13.1  HCT 36.2* 36.7* 38.5*  MCV 93.5 92.4 92.8  MCH 31.5 31.7 31.6  MCHC 33.7 34.3 34.0  RDW 13.8 13.6 13.4  PLT 230 236 224   Thyroid  Recent Labs  Lab 08/15/22 0513  TSH 0.365    BNP Recent Labs  Lab 08/12/22 1124 08/14/22 1500  BNP 1,240.0* 678.0*    DDimer No results for input(s): "DDIMER" in the last 168 hours.   Radiology    No  results found.  Cardiac Studies   Echocardiogram: 08/15/2022 IMPRESSIONS     1. Left ventricular ejection fraction, by estimation, is 60 to 65%. The  left ventricle has normal function. The left ventricle has no regional  wall motion abnormalities. Left ventricular diastolic function could not  be evaluated due to atrial arrythmia.   The E/e' is 26.9.   2. Right ventricular systolic function is normal. The right ventricular  size is normal. Tricuspid regurgitation signal is inadequate for assessing  PA pressure.   3. Left atrial size was severely dilated.   4. Right atrial size was moderately dilated.   5. The mitral valve is abnormal. Trivial mitral valve regurgitation. No  evidence of mitral stenosis. Moderate to severe mitral annular  calcification.   6. The aortic valve was not well visualized. Visually, aortic valve  regurgitation is mild but PHT measures 364 msec likely from diastolic  dysfunction. Mild aortic valve stenosis. Aortic valve area, by VTI  measures 1.03 cm. Aortic valve mean gradient  measures 14.0 mmHg. Aortic valve Vmax measures 2.43 m/s.   7. Aortic dilatation noted. There is borderline dilatation of the aortic  root, measuring 37 mm.   8. The inferior vena cava is dilated in size with <50% respiratory  variability, suggesting right atrial pressure of 15 mmHg.   9. Evidence of atrial level shunting detected by color flow Doppler.  There is a small patent foramen ovale with predominantly left to right  shunting across the atrial septum.   Comparison(s): No significant change from prior study.   Patient Profile     67 y.o. 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   Assessment & Plan    1. History of Atrial  Fibrillation/Atrial Flutter with RVR - Diagnosed with atypical atrial flutter with RVR this admission and ultimately underwent DCCV on 08/17/2022 with successful conversion back to NSR. He is scheduled for an ablation in 10/2022. Did discuss with EP yesterday and could use Amiodarone from their perspective prior to his ablation but discussed with Pharmacy and not ideal given the concurrent use of Prograf (on this given his history of renal transplant). Still receiving  Lopressor  TID. Can likely consolidate to BID dosing or switch to Toprol-XL prior to discharge. In regards to follow-up, he is followed by Cardiology at [facility] and was approved for community care visits with Dr. Lalla Brothers. He will need follow-up with his Cardiologist at the [place] in 2 weeks and will keep scheduled follow-up with Dr. Lalla Brothers.  - Remains on Eliquis  BID for anticoagulation. Given his weight of 49 kg, will need to reduce dosing to 2.5mg  BID if creatinine remains > 1.5 but his baseline appears to be < 1.5 by review of Care Everywhere.    2. Acute HFpEF - BNP was elevated to 1240 on admission and had improved to 678 when rechecked on 4/21. Was receiving IV Lasix but stopped on 4/24 due to worsening renal function. Continue to hold diuretics.    3. AKI/History of Renal Transplant - Creatinine was at 0.96 in 04/2022 by review of Care Everywhere. At 1.52 on admission and trending up after undergoing diuresis with creatinine at 2.01 yesterday and up to 2.20 today despite not receiving Lasix yesterday. Will review with MD in regards to possibly giving a small amount of IV fluids and rechecking a BMET this afternoon.   4. HTN - SBP has been elevated this morning, at 177/68 on most recent check.He is receiving Hydralazine  TID and Lopressor  TID and IV Cardizem. Will titrate Hydralazine to  TID. PTA  Lisinopril  daily has been held given his AKI.    5. HLD - Remains on Atorvastatin  daily.    6.  Hyponatremia - Na+ as low as 127 this admission, improving to 131 today. Diuretics have been held.    7. Aortic Stenosis and Regurgitation - Echo this admission showed mild AI and mild AS with a small PFO as well. Will need to be followed as an outpatient.    8. Lobar PNA - Being treated for PNA this admission with IV antibiotics. Remains on Doxycycline. Management pet the admitting team.    For questions or updates, please contact [facility] Please consult [email or web address] for contact info under        Signed, Ellsworth Lennox, PA-C  08/18/2022, 7:55 AM     Attending attestation Patient seen and independently examined with Randall An, PA-C. We discussed all aspects of the encounter. I agree with the assessment and plan as stated above.  Patient is s/p DCCV on 08/17/2022 with a successful conversion to NSR.  Diltiazem drip was discontinued.  No acute events overnight, patient is upset that his serum creatinine increased from 2 to 2.2 this a.m. 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 He had decreased p.o. intake due to food in the cafeteria and also must be dehydrated due to n.p.o. status yesterday.  Otherwise, his blood pressure was noted to be elevated, 190s mm Hg SBP during my interview.  Physical examination is remarkable for patient not in acute respite distress, HEENT normal, JVD not examined, S1-S2 normal, no S3, clear lungs, no pitting edema in bilateral lower extremities.  # Atrial flutter S/P DCCV on 08/17/2022: EKG showed NSR.  Continue metoprolol tartrate at the current dose and Eliquis 5 mg twice daily.  Amiodarone has interactions with immunosuppressive medications he takes (confirmed with pharmacy).  Will refer to A-fib clinic upon discharge for antiarrhythmic options.  He was scheduled to undergo A-fib ablation in July 2024. # AKI versus AKI on CKD: He had normal  serum creatinine, 0.8 in 04/2022.  On  admission, his serum creatinine was 1.5. Administer 250 mL bolus followed by 100 cc/h IV fluids. Repeat BMP in the afternoon. Patient strongly encouraged to follow-up with kidney transplant specialist at [facility] right away after discharge.  He voiced understanding. # Mild aortic regurgitation, mild aortic valve stenosis and small PFO: Outpatient surveillance  I have spent a total of 30 minutes with patient reviewing chart , telemetry, EKGs, labs and examining patient as well as establishing an assessment and plan that was discussed with the patient.  > 50% of time was spent in direct patient care.     Shenita Trego Verne Spurr, MD [place]  [facility]  9:56 AM

## 2022-08-18 NOTE — Progress Notes (Signed)
PROGRESS NOTE  OSCAR HANK ZOX:[id number] DOB: 05-02-1955 DOA: 08/12/2022 PCP: [facility]  Brief History:  67 year old male with a history of renal transplant x 2 in 2003 and 2011, COPD, tobacco abuse, coronary artery disease with history of MI, atrial fibrillation, and hyperlipidemia presenting with 2-week history of shortness of breath, coughing, and chest congestion.  He states that he has been coughing up some yellow sputum, but denies any hemoptysis.  He has had some subjective sweats but has not taken his temperature.  Notably, the patient has a portable heart monitor that he has been using and has noted that he has had atrial fibrillation with RVR in the past 4 days.  He states that his blood pressure has been running soft with systolic BP in the low 100s.  He denies any syncope, but feels tired and short of breath.  He states that he did have some chest discomfort when he was having RVR with his atrial fibrillation.  At present, the patient denies any chest discomfort, nausea, vomiting or direct abdominal pain.  He continues to smoke slightly over 1 pack/day.  He states that he last picked up a cigarette on 08/08/2022.  Because of his atrial fibrillation and rapid heart rate he presented for further evaluation and treatment.  He denies any worsening lower extremity edema or orthopnea. In the ED, the patient was afebrile hemodynamically stable with oxygen saturation 90% on room air.  WBC 10.5, hemoglobin 14.0, platelets 199,000.  Sodium 132, potassium 4.1, bicarbonate 24, serum creatinine 1.52.  COVID-19 PCR was negative.  BNP 1240.  Chest x-ray showed chronic interstitial markings and hyperinflation.  EKG showed atrial flutter with right bundle branch block.  The patient was started on diltiazem.  Cardiology was consulted to assist with management.  The patient subsequently developed RVR.  He was started on diltiazem drip.  His rate was controlled, but remained in atrial  flutter.  Cardiology performed DCCV cardioversion on 08/17/2022 after which pt was in sinus rhythm.   Assessment/Plan: Acute respiratory failure with hypoxia -Secondary COPD exacerbation -Initially placed on 2 L nasal cannula -Presented with hypoxia and tachypnea -Wean oxygen for saturation greater 92% -08/02/2022 CT chest--advanced emphysema; ill-defined opacity and reticular nodular change in the RLL>RML; RML opacities are new; mildly enlarged mediastinal lymph nodes -4/21 ReDS reading--57 -echo EF 60-65%, no WMA, normal RVF -08/17/22--weaned to RA after DCCV    COPD exacerbation -continue IV Solu-Medrol>>transitioned to po prednisone 4/24 -continue Pulmicort -Continue on duonebs -Continue Brovana -COVID/Flu/RSV neg -viral resp panel--neg -continue yupelri  AKI - likely prerenal - appreciate nephrology consultation - holding lisinopril temporarily  - gently hydrating with IV fluid - recheck renal function panel in AM    Acute on chronic HFpEF -appreciate cardiology -continue IV lasix--last dose 4/23>>held now due to uptrend serum creatinine -weight 109.5>>105.1 lbs   Intake/Output Summary (Last 24 hours) at 08/18/2022 1437 Last data filed at 08/18/2022 1221 Gross per 24 hour  Intake 91.17 ml  Output 375 ml  Net -283.83 ml   Filed Weights   08/17/22 0506 08/17/22 1241 08/18/22 0500  Weight: 49 kg 49 kg 49 kg    Lobar pneumonia --08/02/2022 CT chest--advanced emphysema; ill-defined opacity and reticular nodular change in the RLL>RML; RML opacities are new; mildly enlarged mediastinal lymph nodes -continue ceftriaxone -Continue doxycycline -PCT 0.26>>0.13 -stop after 5 days    Atrial fibrillation/flutter with RVR, type unspecified -pt states he is due to ablation on  11/07/22 -appreciate cardiology consult -restart metoprolol--increased to 3 times daily -continue diltiazem gtt>>now rate controlled, remains in a flutter -continue apixaban -obtain echo---echo EF 60-65%,  no WMA, normal RVF -DCCV 4/24>>back to sinus -follow up outpatient for ablation    Renal transplant recipient -continue tacrolimus and azathioprine -no longer taking mycophenolate -on oral prednisone -nephrology team consulted 4/25 due to AKI   Tobacco abuse -tobacco cessation discussed   Hyperlipidemia -continue statin   CAD/elevated troponin -Secondary to demand ischemia -no chest pain presently -continue statin, metoprolol, apixaban   Hyponatremia -improved with diuresis       Family Communication:   Significant other at bedside  Consultants: Cardiology, nephrology  Code Status:  FULL  DVT Prophylaxis: Apixaban     Procedures: As Listed in Progress Note Above   Antibiotics: Ceftriaxone 4/20>> Doxycycline 4/19>>   Subjective: Pt having intermittent bouts of shortness of breath, no CP.   Objective: Vitals:   08/18/22 1228 08/18/22 1300 08/18/22 1343 08/18/22 1400  BP:   (!) 169/91 (!) 165/81  Pulse: 78 70 66 62  Resp: (!) 23 16 13 19   Temp:      TempSrc:      SpO2: 90% 92% 92% 93%  Weight:      Height:        Intake/Output Summary (Last 24 hours) at 08/18/2022 1435 Last data filed at 08/18/2022 1221 Gross per 24 hour  Intake 91.17 ml  Output 375 ml  Net -283.83 ml   Weight change: 0 kg Exam:  General:  Pt chronically ill appearing, lying supine in bed, NAD. He is somewhat sullen today;  HEENT: No icterus, No thrush, No neck mass, [place]/AT Cardiovascular: normal  S1/S2, no rubs, no gallops Respiratory: bibasilar crackles heard, no increased work of breathing.   Abdomen: Soft/+BS, non tender, non distended, no guarding Extremities: trace pretibial edema, No lymphangitis, No petechiae, No rashes, no synovitis Neurological: nonfocal exam   Data Reviewed: I have personally reviewed following labs and imaging studies Basic Metabolic Panel: Recent Labs  Lab 08/12/22 1124 08/13/22 1257 08/14/22 0343 08/15/22 0513 08/16/22 0250 08/17/22 0448  08/18/22 0504  NA 132* 130* 128* 127* 127* 128* 131*  K 4.1 4.6 4.9 5.4* 5.0 5.0 4.7  CL 97* 97* 96* 96* 94* 94* 96*  CO2 24 21* 23 23 24 25 26   GLUCOSE 190* 236* 194* 149* 209* 162* 107*  BUN 41* 46* 49* 57* 63* 74* 90*  CREATININE 1.52* 1.44* 1.36* 1.57* 1.69* 2.01* 2.20*  CALCIUM 9.0 8.8* 9.0 9.0 8.8* 8.8* 8.9  MG 1.9 1.9 1.9 1.8  --   --   --    Liver Function Tests: No results for input(s): "AST", "ALT", "ALKPHOS", "BILITOT", "PROT", "ALBUMIN" in the last 168 hours. No results for input(s): "LIPASE", "AMYLASE" in the last 168 hours. No results for input(s): "AMMONIA" in the last 168 hours. Coagulation Profile: No results for input(s): "INR", "PROTIME" in the last 168 hours. CBC: Recent Labs  Lab 08/14/22 0343 08/15/22 0513 08/16/22 0250 08/17/22 0448 08/18/22 0504  WBC 10.7* 10.7* 9.4 8.7 10.4  HGB 11.8* 11.7* 12.2* 12.6* 13.1  HCT 35.2* 34.9* 36.2* 36.7* 38.5*  MCV 94.6 94.3 93.5 92.4 92.8  PLT 221 243 230 236 224   Cardiac Enzymes: No results for input(s): "CKTOTAL", "CKMB", "CKMBINDEX", "TROPONINI" in the last 168 hours. BNP: Invalid input(s): "POCBNP" CBG: No results for input(s): "GLUCAP" in the last 168 hours. HbA1C: No results for input(s): "HGBA1C" in the last 72 hours. Urine analysis:  Component Value Date/Time   COLORURINE AMBER (A) 12/03/2016 1026   APPEARANCEUR CLEAR 12/03/2016 1026   LABSPEC 1.023 12/03/2016 1026   PHURINE 5.0 12/03/2016 1026   GLUCOSEU NEGATIVE 12/03/2016 1026   HGBUR SMALL (A) 12/03/2016 1026   BILIRUBINUR NEGATIVE 12/03/2016 1026   KETONESUR 5 (A) 12/03/2016 1026   PROTEINUR >=300 (A) 12/03/2016 1026   NITRITE NEGATIVE 12/03/2016 1026   LEUKOCYTESUR NEGATIVE 12/03/2016 1026   Recent Results (from the past 240 hour(s))  Resp panel by RT-PCR (RSV, Flu A&B, Covid) Anterior Nasal Swab     Status: None   Collection Time: 08/12/22 11:34 AM   Specimen: Anterior Nasal Swab  Result Value Ref Range Status   SARS Coronavirus 2  by RT PCR NEGATIVE NEGATIVE Final    Comment: (NOTE) SARS-CoV-2 target nucleic acids are NOT DETECTED.  The SARS-CoV-2 RNA is generally detectable in upper respiratory specimens during the acute phase of infection. The lowest concentration of SARS-CoV-2 viral copies this assay can detect is 138 copies/mL. A negative result does not preclude SARS-Cov-2 infection and should not be used as the sole basis for treatment or other patient management decisions. A negative result may occur with  improper specimen collection/handling, submission of specimen other than nasopharyngeal swab, presence of viral mutation(s) within the areas targeted by this assay, and inadequate number of viral copies(<138 copies/mL). A negative result must be combined with clinical observations, patient history, and epidemiological information. The expected result is Negative.  Fact Sheet for Patients:  BloggerCourse.com  Fact Sheet for Healthcare Providers:  SeriousBroker.it  This test is no t yet approved or cleared by the [place] FDA and  has been authorized for detection and/or diagnosis of SARS-CoV-2 by FDA under an Emergency Use Authorization (EUA). This EUA will remain  in effect (meaning this test can be used) for the duration of the COVID-19 declaration under Section 564(b)(1) of the Act, 21 U.S.C.section 360bbb-3(b)(1), unless the authorization is terminated  or revoked sooner.       Influenza A by PCR NEGATIVE NEGATIVE Final   Influenza B by PCR NEGATIVE NEGATIVE Final    Comment: (NOTE) The Xpert Xpress SARS-CoV-2/FLU/RSV plus assay is intended as an aid in the diagnosis of influenza from Nasopharyngeal swab specimens and should not be used as a sole basis for treatment. Nasal washings and aspirates are unacceptable for Xpert Xpress SARS-CoV-2/FLU/RSV testing.  Fact Sheet for Patients: BloggerCourse.com  Fact  Sheet for Healthcare Providers: SeriousBroker.it  This test is not yet approved or cleared by the [place] FDA and has been authorized for detection and/or diagnosis of SARS-CoV-2 by FDA under an Emergency Use Authorization (EUA). This EUA will remain in effect (meaning this test can be used) for the duration of the COVID-19 declaration under Section 564(b)(1) of the Act, 21 U.S.C. section 360bbb-3(b)(1), unless the authorization is terminated or revoked.     Resp Syncytial Virus by PCR NEGATIVE NEGATIVE Final    Comment: (NOTE) Fact Sheet for Patients: BloggerCourse.com  Fact Sheet for Healthcare Providers: SeriousBroker.it  This test is not yet approved or cleared by the [place] FDA and has been authorized for detection and/or diagnosis of SARS-CoV-2 by FDA under an Emergency Use Authorization (EUA). This EUA will remain in effect (meaning this test can be used) for the duration of the COVID-19 declaration under Section 564(b)(1) of the Act, 21 U.S.C. section 360bbb-3(b)(1), unless the authorization is terminated or revoked.  Performed at [facility], [address] [place], [place] [address]  Respiratory (~20 pathogens) panel by PCR     Status: None   Collection Time: 08/12/22  3:52 PM   Specimen: Nasopharyngeal Swab; Respiratory  Result Value Ref Range Status   Adenovirus NOT DETECTED NOT DETECTED Final   Coronavirus 229E NOT DETECTED NOT DETECTED Final    Comment: (NOTE) The Coronavirus on the Respiratory Panel, DOES NOT test for the novel  Coronavirus (2019 nCoV)    Coronavirus HKU1 NOT DETECTED NOT DETECTED Final   Coronavirus NL63 NOT DETECTED NOT DETECTED Final   Coronavirus OC43 NOT DETECTED NOT DETECTED Final   Metapneumovirus NOT DETECTED NOT DETECTED Final   Rhinovirus / Enterovirus NOT DETECTED NOT DETECTED Final   Influenza A NOT DETECTED NOT DETECTED Final   Influenza B  NOT DETECTED NOT DETECTED Final   Parainfluenza Virus 1 NOT DETECTED NOT DETECTED Final   Parainfluenza Virus 2 NOT DETECTED NOT DETECTED Final   Parainfluenza Virus 3 NOT DETECTED NOT DETECTED Final   Parainfluenza Virus 4 NOT DETECTED NOT DETECTED Final   Respiratory Syncytial Virus NOT DETECTED NOT DETECTED Final   Bordetella pertussis NOT DETECTED NOT DETECTED Final   Bordetella Parapertussis NOT DETECTED NOT DETECTED Final   Chlamydophila pneumoniae NOT DETECTED NOT DETECTED Final   Mycoplasma pneumoniae NOT DETECTED NOT DETECTED Final    Comment: Performed at [facility], [address], [place], [place] [address]  MRSA Next Gen by PCR, Nasal     Status: None   Collection Time: 08/12/22  3:52 PM   Specimen: Nasal Mucosa; Nasal Swab  Result Value Ref Range Status   MRSA by PCR Next Gen NOT DETECTED NOT DETECTED Final    Comment: (NOTE) The GeneXpert MRSA Assay (FDA approved for NASAL specimens only), is one component of a comprehensive MRSA colonization surveillance program. It is not intended to diagnose MRSA infection nor to guide or monitor treatment for MRSA infections. Test performance is not FDA approved in patients less than 30 years old. Performed at [facility], [address] [place], [place] [address]      Scheduled Meds:  apixaban  2.5 mg Oral BID   arformoterol  15 mcg Nebulization BID   atorvastatin  40 mg Oral QHS   azaTHIOprine  50 mg Oral Daily   budesonide (PULMICORT) nebulizer solution  0.5 mg Nebulization BID   Chlorhexidine Gluconate Cloth  6 each Topical Daily   cyanocobalamin  1,000 mcg Oral Daily   doxycycline  100 mg Oral Q12H   hydrALAZINE  25 mg Oral Q8H   hydrocortisone cream   Topical TID   metoprolol tartrate  50 mg Oral TID   pantoprazole  40 mg Oral QAC supper   polyethylene glycol  17 g Oral Daily   predniSONE  50 mg Oral Q breakfast   revefenacin  175 mcg Nebulization Daily   senna  2 tablet Oral Daily   tacrolimus  1.5 mg Oral  Daily   And   tacrolimus  2 mg Oral QHS   Continuous Infusions:  sodium chloride 100 mL/hr at 08/18/22 1038   cefTRIAXone (ROCEPHIN)  IV 1 g (08/18/22 1340)   diltiazem (CARDIZEM) infusion Stopped (08/17/22 1453)    Procedures/Studies: DG CHEST PORT 1 VIEW  Result Date: 08/18/2022 CLINICAL DATA:  Shortness of breath. EXAM: PORTABLE CHEST 1 VIEW COMPARISON:  08/14/2022 and 08/12/2022 FINDINGS: Coarse lung markings with underlying emphysema. Negative for pneumothorax. Prominent lung markings have minimally changed since 08/14/2022 but more prominent compared to 08/12/2022. Heart size is normal  and stable. Atherosclerotic calcifications at the aortic arch. Trachea is midline. IMPRESSION: 1. Chronic lung changes compatible with emphysema. 2. Concern for increased interstitial lung markings which could represent interstitial edema and/or atypical infection. Electronically Signed   By: Richarda Overlie M.D.   On: 08/18/2022 12:22   ECHOCARDIOGRAM COMPLETE  Result Date: 08/15/2022    ECHOCARDIOGRAM REPORT   Patient Name:   ZAMARI VEA Date of Exam: 08/15/2022 Medical Rec #:  [id number]        Height:       68.0 in Accession #:    [id number]       Weight:       105.2 lb Date of Birth:  04-02-1956       BSA:          1.555 m Patient Age:    66 years         BP:           151/90 mmHg Patient Gender: M                HR:           67 bpm. [facility] Procedure: 2D Echo, Cardiac Doppler and Color Doppler Indications:    Atrial Fibrillation I48.91  History:        Patient has prior history of Echocardiogram examinations, most                 recent 05/18/2021. Previous Myocardial Infarction, COPD,                 Arrythmias:Atrial Fibrillation; Risk Factors:Hypertension. Hx of                 COVID-19 virus infection.  Sonographer:    Celesta Gentile RCS Referring Phys: [phone] DAVID TAT IMPRESSIONS  1. Left ventricular ejection fraction, by estimation, is 60 to 65%. The left ventricle has normal  function. The left ventricle has no regional wall motion abnormalities. Left ventricular diastolic function could not be evaluated due to atrial arrythmia.  The E/e' is 26.9.  2. Right ventricular systolic function is normal. The right ventricular size is normal. Tricuspid regurgitation signal is inadequate for assessing PA pressure.  3. Left atrial size was severely dilated.  4. Right atrial size was moderately dilated.  5. The mitral valve is abnormal. Trivial mitral valve regurgitation. No evidence of mitral stenosis. Moderate to severe mitral annular calcification.  6. The aortic valve was not well visualized. Visually, aortic valve regurgitation is mild but PHT measures 364 msec likely from diastolic dysfunction. Mild aortic valve stenosis. Aortic valve area, by VTI measures 1.03 cm. Aortic valve mean gradient measures 14.0 mmHg. Aortic valve Vmax measures 2.43 m/s.  7. Aortic dilatation noted. There is borderline dilatation of the aortic root, measuring 37 mm.  8. The inferior vena cava is dilated in size with <50% respiratory variability, suggesting right atrial pressure of 15 mmHg.  9. Evidence of atrial level shunting detected by color flow Doppler. There is a small patent foramen ovale with predominantly left to right shunting across the atrial septum. Comparison(s): No significant change from prior study. FINDINGS  Left Ventricle: Left ventricular ejection fraction, by estimation, is 60 to 65%. The left ventricle has normal function. The left ventricle has no regional wall motion abnormalities. The left ventricular internal cavity size was normal in size. There is  no left ventricular hypertrophy. Left ventricular diastolic function could not be evaluated due to atrial fibrillation. Left ventricular  diastolic function could not be evaluated. The E/e' is 26.9. Right Ventricle: The right ventricular size is normal. No increase in right ventricular wall thickness. Right ventricular systolic function is  normal. Tricuspid regurgitation signal is inadequate for assessing PA pressure. Left Atrium: Left atrial size was severely dilated. Right Atrium: Right atrial size was moderately dilated. Pericardium: There is no evidence of pericardial effusion. Mitral Valve: The mitral valve is abnormal. Moderate to severe mitral annular calcification. Trivial mitral valve regurgitation. No evidence of mitral valve stenosis. Tricuspid Valve: The tricuspid valve is normal in structure. Tricuspid valve regurgitation is trivial. No evidence of tricuspid stenosis. Aortic Valve: The aortic valve was not well visualized. Aortic valve regurgitation is mild. Aortic regurgitation PHT measures 364 msec. Mild aortic stenosis is present. Aortic valve mean gradient measures 14.0 mmHg. Aortic valve peak gradient measures 23.6 mmHg. Aortic valve area, by VTI measures 1.03 cm. Pulmonic Valve: The pulmonic valve was not well visualized. Pulmonic valve regurgitation is not visualized. No evidence of pulmonic stenosis. Aorta: Aortic dilatation noted. There is borderline dilatation of the aortic root, measuring 37 mm. Venous: The inferior vena cava is dilated in size with less than 50% respiratory variability, suggesting right atrial pressure of 15 mmHg. IAS/Shunts: Evidence of atrial level shunting detected by color flow Doppler. A small patent foramen ovale is detected with predominantly left to right shunting across the atrial septum.  LEFT VENTRICLE PLAX 2D LVIDd:         3.90 cm   Diastology LVIDs:         2.60 cm   LV e' medial:    6.20 cm/s LV PW:         0.90 cm   LV E/e' medial:  26.9 LV IVS:        1.20 cm   LV e' lateral:   7.94 cm/s LVOT diam:     1.90 cm   LV E/e' lateral: 21.0 LV SV:         58 LV SV Index:   37 LVOT Area:     2.84 cm  RIGHT VENTRICLE RV S prime:     12.20 cm/s TAPSE (M-mode): 2.0 cm LEFT ATRIUM              Index        RIGHT ATRIUM           Index LA diam:        4.20 cm  2.70 cm/m   RA Area:     24.00 cm LA Vol  (A2C):   101.0 ml 64.95 ml/m  RA Volume:   74.50 ml  47.91 ml/m LA Vol (A4C):   102.0 ml 65.59 ml/m LA Biplane Vol: 103.0 ml 66.23 ml/m  AORTIC VALVE AV Area (Vmax):    0.94 cm AV Area (Vmean):   0.94 cm AV Area (VTI):     1.03 cm AV Vmax:           243.00 cm/s AV Vmean:          181.000 cm/s AV VTI:            0.564 m AV Peak Grad:      23.6 mmHg AV Mean Grad:      14.0 mmHg LVOT Vmax:         80.40 cm/s LVOT Vmean:        60.300 cm/s LVOT VTI:          0.204 m LVOT/AV VTI ratio: 0.36 AI PHT:  364 msec  AORTA Ao Root diam: 3.70 cm MITRAL VALVE MV Area (PHT): 5.54 cm     SHUNTS MV Decel Time: 137 msec     Systemic VTI:  0.20 m MV E velocity: 167.00 cm/s  Systemic Diam: 1.90 cm MV A velocity: 52.80 cm/s MV E/A ratio:  3.16 Vishnu Priya Mallipeddi Electronically signed by Winfield Rast Mallipeddi Signature Date/Time: 08/15/2022/3:06:35 PM    Final    DG Chest 1 View  Result Date: 08/14/2022 CLINICAL DATA:  Shortness of breath EXAM: CHEST  1 VIEW COMPARISON:  08/12/2022 FINDINGS: Bilateral interstitial opacities with mild hyperinflation. Normal cardiomediastinal contours and pleural spaces. IMPRESSION: Bilateral interstitial opacities with mild hyperinflation, likely due to COPD. Electronically Signed   By: Deatra Robinson M.D.   On: 08/14/2022 19:55   CT CHEST WO CONTRAST  Result Date: 08/12/2022 CLINICAL DATA:  Dyspnea.  Chronic cough.  COPD. EXAM: CT CHEST WITHOUT CONTRAST TECHNIQUE: Multidetector CT imaging of the chest was performed following the standard protocol without IV contrast. RADIATION DOSE REDUCTION: This exam was performed according to the departmental dose-optimization program which includes automated exposure control, adjustment of the mA and/or kV according to patient size and/or use of iterative reconstruction technique. COMPARISON:  X-ray 08/12/2022 and older.  CT angiogram 05/17/2021 FINDINGS: Cardiovascular: Trace pericardial fluid. Heart is nonenlarged. Coronary artery  calcifications are seen. There also calcifications along the area of the aortic valve and mitral valve annulus. The thoracic aorta on this noncontrast exam has a normal course and caliber with prominent calcified plaque including along the origin of the great vessels, in particular the left subclavian artery. Mediastinum/Nodes: No specific abnormal lymph node enlargement identified in the axillary region or hilum. There are some prominent to mildly enlarged mediastinal nodes however for example right paratracheal posteriorly along the upper mediastinum on series 2, image 28 measuring 17 by 9 mm, precarinal on series 2, image 65 measuring 2.2 by 1.1 cm. These are unchanged from the previous examination. Other nodes are present as well left paratracheal, subcarinal. There are some lymph nodes in the mediastinum and hila with calcifications. Lungs/Pleura: Centrilobular emphysematous lung changes are identified. There are calcified lung nodules bilaterally consistent with old granulomatous disease. No pneumothorax or effusion. There is some ill-defined reticular parenchymal opacities identified in the right lower lobe, middle lobe and lingula. Areas in the right lower lobe and lingula were seen on the previous examination could be more chronic. There are some associated areas of bronchial wall thickening. The dependent ill-defined areas in the middle lobe are new from the examination of 2023. There is a noncalcified nodule measuring 4 mm in the inferior aspect of the right upper lobe on series 4, image 73 which is stable on the prior and by report been stable since 2018 demonstrating long-term stability. Upper Abdomen: Along the upper abdomen the kidneys are severely atrophic. Slight thickening of the left adrenal gland is stable. Musculoskeletal: Mild degenerative changes are seen along the spine. IMPRESSION: Advanced emphysematous lung changes. There is some ill-defined opacity and reticular changes involving the  right lower lobe greater than middle lobe and lingula. The areas in the right lower lobe and lingula were seen on the study of 2023. This could be chronic versus a recurrent acute process. The areas in the middle lobe were not seen at the time. Again an acute process is possible and recommend follow-up to confirm clearance. Evidence of old granulomatous disease. Stable mildly enlarged mediastinal lymph nodes Aortic Atherosclerosis (ICD10-I70.0) and Emphysema (ICD10-J43.9). Electronically  Signed   By: Karen Kays M.D.   On: 08/12/2022 15:50   DG Chest Port 1 View  Result Date: 08/12/2022 CLINICAL DATA:  Shortness of breath EXAM: PORTABLE CHEST 1 VIEW COMPARISON:  Radiograph 05/26/2021, 05/17/2021 FINDINGS: Unchanged cardiomediastinal silhouette. Emphysema with prominent interstitial opacities and peripheral reticulation, similar to previous exams. No new focal airspace disease. No large effusion or evidence of pneumothorax. IMPRESSION: Emphysema with chronic interstitial changes. No new focal airspace disease. Electronically Signed   By: Caprice Renshaw M.D.   On: 08/12/2022 11:51    Standley Dakins, MD  [facility]  If 7PM-7AM, please contact night-coverage [email or web address] Password [facility] 08/18/2022, 2:35 PM   LOS: 5 days

## 2022-08-18 NOTE — Consult Note (Signed)
[facility]  HISTORY AND PHYSICAL  Calvin Carroll an 67 y.o. male.    Chief Complaint: Afib with RVR  HPI: Pt is a 32M with a PMH of Afib, COPD, and ESRD s/p renal transplant x 2, most recently 2011, who is now seen in consultation at the request of Dr. Laural Benes for evaluation and recommendations surrounding management of renal transplant and AKI.    Pt's baseline Cr is 0.9-1.1.    Presented 08/12/22 with SOB.  Noted to have Afib with Rvr, soft BP with systolics < 100 the 4 days prior to admission.  Noted to have Afib with RVR on admission in ED.  Started on dilt gtt and then was given IV Lasix for diuresis.  Under went DCCV yesterday.  Lisinopril held.    Cr trend from 1.5 on admission to 2.2 today.  Getting some IVFs back.  Continuing to hold lisinopril.  In this setting we are asked to see.    Pt doesn't think he empties his bladder fully.  Has some urgency.  Still smoking.  On pred 5 mg daily and prograf 1.5 mg q AM and 2 mg q PM.  At last transplant visit 04/2022 changed MMF to imuran 50 mg daily d/t length of time on IS and h/o weight loss.    PMH: Past Medical History:  Diagnosis Date   Atrial fibrillation    COPD (chronic obstructive pulmonary disease)    Hypertension    MI (myocardial infarction)    Renal disorder    PSH: Past Surgical History:  Procedure Laterality Date   KIDNEY TRANSPLANT     [facility]   KIDNEY TRANSPLANT  2011   Pt has had 2 transplants   NEPHRECTOMY TRANSPLANTED ORGAN       Past Medical History:  Diagnosis Date   Atrial fibrillation    COPD (chronic obstructive pulmonary disease)    Hypertension    MI (myocardial infarction)    Renal disorder     Medications:  Scheduled:  apixaban  2.5 mg Oral BID   arformoterol  15 mcg Nebulization BID   atorvastatin  40 mg Oral QHS   azaTHIOprine  50 mg Oral Daily   budesonide (PULMICORT) nebulizer solution  0.5 mg Nebulization BID   Chlorhexidine Gluconate Cloth  6 each Topical Daily    cyanocobalamin  1,000 mcg Oral Daily   doxycycline  100 mg Oral Q12H   hydrALAZINE  25 mg Oral Q8H   hydrocortisone cream   Topical TID   metoprolol tartrate  50 mg Oral TID   pantoprazole  40 mg Oral QAC supper   polyethylene glycol  17 g Oral Daily   predniSONE  50 mg Oral Q breakfast   revefenacin  175 mcg Nebulization Daily   senna  2 tablet Oral Daily   tacrolimus  1.5 mg Oral Daily   And   tacrolimus  2 mg Oral QHS    Medications Prior to Admission  Medication Sig Dispense Refill   albuterol (PROVENTIL HFA;VENTOLIN HFA) 108 (90 Base) MCG/ACT inhaler Inhale 1-2 puffs into the lungs every 4 (four) hours as needed for wheezing or shortness of breath.     apixaban (ELIQUIS) 5 MG TABS tablet Take 1 tablet (5 mg total) by mouth 2 (two) times daily. 60 tablet 1   atorvastatin (LIPITOR) 40 MG tablet Take 40 mg by mouth at bedtime.     azaTHIOprine (IMURAN) 50 MG tablet Take 1 tablet by mouth daily.  fexofenadine (ALLEGRA) 180 MG tablet Take 180 mg by mouth daily as needed for allergies.     flunisolide (NASALIDE) 25 MCG/ACT (0.025%) SOLN Place 2 sprays into the nose 2 (two) times daily.     guaiFENesin (MUCINEX PO) Take 2 tablets by mouth every 4 (four) hours.     hydrALAZINE (APRESOLINE) 25 MG tablet Take 25 mg by mouth 3 (three) times daily.     lisinopril (ZESTRIL) 40 MG tablet Take 40 mg by mouth daily.     metoprolol tartrate (LOPRESSOR) 50 MG tablet Take 50 mg by mouth 2 (two) times daily.     Mometasone Furoate 200 MCG/ACT AERO Inhale 2 each into the lungs in the morning and at bedtime.     predniSONE (DELTASONE) 5 MG tablet Take 5 mg by mouth every evening.     tacrolimus (PROGRAF) 0.5 MG capsule Take 0.5 mg by mouth See admin instructions. Take 0.5 mg daily in the morning with the 1 mg capsule to equal 1.5 mg in the morning     tacrolimus (PROGRAF) 1 MG capsule Take 1 mg by mouth See admin instructions. Take 1 mg in the morning (take with 0.5 mg to equal 1.5 mg in the  morning) and 2 mg in the evening     Tiotropium Bromide-Olodaterol (STIOLTO RESPIMAT) 2.5-2.5 MCG/ACT AERS Inhale 2 each into the lungs daily.     torsemide (DEMADEX) 20 MG tablet Take 20 mg by mouth as needed (fluid).     benzonatate (TESSALON) 100 MG capsule TAKE ONE CAPSULE BY MOUTH EVERY 8 HOURS AS NEEDED FOR COUGH (Patient not taking: Reported on 08/13/2022)     Cyanocobalamin (B-12) 500 MCG TABS Take 1,000 mcg by mouth daily. (Patient not taking: Reported on 08/13/2022)     metoprolol tartrate (LOPRESSOR) 25 MG tablet Take 2 tablets (50 mg total) by mouth 2 (two) times daily. (Patient not taking: Reported on 12/03/2021) 60 tablet 1   mycophenolate (MYFORTIC) 180 MG EC tablet Take 180 mg by mouth 2 (two) times daily. (Patient not taking: Reported on 08/13/2022)     pantoprazole (PROTONIX) 40 MG tablet Take 40 mg by mouth daily before supper. (Patient not taking: Reported on 08/13/2022)      ALLERGIES:  No Known Allergies  FAM HX: History reviewed. No pertinent family history.  Social History:   reports that he has been smoking cigarettes. He has been smoking an average of 1.5 packs per day. He has never used smokeless tobacco. He reports current alcohol use. He reports that he does not use drugs.  ROS: ROS: all other systems reviewed and are negative ecpet as per HPI  Blood pressure (!) 174/79, pulse 65, temperature 97.6 F (36.4 C), temperature source Oral, resp. rate 16, height 5\' 8"  (1.727 m), weight 49 kg, SpO2 91 %. PHYSICAL EXAM: Physical Exam GEN nad, lying in bed HEENT EOMI PERRL + glasses NECK no JVD PULM poor air movement bilaterally CV RRR ABD thin, soft, allograft RLQ and nontender EXT no LE edema NEURO Aao x 3 nonfocal SKIN warm and dry    Results for orders placed or performed during the hospital encounter of 08/12/22 (from the past 48 hour(s))  CBC     Status: Abnormal   Collection Time: 08/17/22  4:48 AM  Result Value Ref Range   WBC 8.7 4.0 - 10.5 K/uL   RBC  3.97 (L) 4.22 - 5.81 MIL/uL   Hemoglobin 12.6 (L) 13.0 - 17.0 g/dL   HCT 40.9 (L) 81.1 -  52.0 %   MCV 92.4 80.0 - 100.0 fL   MCH 31.7 26.0 - 34.0 pg   MCHC 34.3 30.0 - 36.0 g/dL   RDW 16.1 09.6 - 04.5 %   Platelets 236 150 - 400 K/uL   nRBC 0.0 0.0 - 0.2 %    Comment: Performed at [facility], [address] [place], [place] [address]  Basic metabolic panel     Status: Abnormal   Collection Time: 08/17/22  4:48 AM  Result Value Ref Range   Sodium 128 (L) 135 - 145 mmol/L   Potassium 5.0 3.5 - 5.1 mmol/L   Chloride 94 (L) 98 - 111 mmol/L   CO2 25 22 - 32 mmol/L   Glucose, Bld 162 (H) 70 - 99 mg/dL    Comment: Glucose reference range applies only to samples taken after fasting for at least 8 hours.   BUN 74 (H) 8 - 23 mg/dL   Creatinine, Ser 1.91 (H) 0.61 - 1.24 mg/dL   Calcium 8.8 (L) 8.9 - 10.3 mg/dL   GFR, Estimated 36 (L) >60 mL/min    Comment: (NOTE) Calculated using the CKD-EPI Creatinine Equation (2021)    Anion gap 9 5 - 15    Comment: Performed at [facility], [address], [place], [place] [address]  CBC     Status: Abnormal   Collection Time: 08/18/22  5:04 AM  Result Value Ref Range   WBC 10.4 4.0 - 10.5 K/uL   RBC 4.15 (L) 4.22 - 5.81 MIL/uL   Hemoglobin 13.1 13.0 - 17.0 g/dL   HCT 56.2 (L) 13.0 - 86.5 %   MCV 92.8 80.0 - 100.0 fL   MCH 31.6 26.0 - 34.0 pg   MCHC 34.0 30.0 - 36.0 g/dL   RDW 78.4 69.6 - 29.5 %   Platelets 224 150 - 400 K/uL   nRBC 0.2 0.0 - 0.2 %    Comment: Performed at [facility], [address], [place], [place] [address]  Basic metabolic panel     Status: Abnormal   Collection Time: 08/18/22  5:04 AM  Result Value Ref Range   Sodium 131 (L) 135 - 145 mmol/L   Potassium 4.7 3.5 - 5.1 mmol/L   Chloride 96 (L) 98 - 111 mmol/L   CO2 26 22 - 32 mmol/L   Glucose, Bld 107 (H) 70 - 99 mg/dL    Comment: Glucose reference range applies only to samples taken after fasting for at least 8 hours.   BUN 90 (H) 8 - 23 mg/dL   Creatinine, Ser 2.44  (H) 0.61 - 1.24 mg/dL   Calcium 8.9 8.9 - 01.0 mg/dL   GFR, Estimated 32 (L) >60 mL/min    Comment: (NOTE) Calculated using the CKD-EPI Creatinine Equation (2021)    Anion gap 9 5 - 15    Comment: Performed at [facility], [address] [place], [place] [address]    No results found.  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  Initial cause of ESRD was lead poisioning.  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  Baseline cr is 0.9-1.1, now with AKI from 1.5 on admission to 2.2 today  - likely hemodynamic in the setting of Afib, soft BP, diuresis  - agree with holding diuresis, giving IVFs  - low suspicion for rejection  - will check UP/C, prograf level, and renal transplant [place]  - continue baseline IS  - agree with continuing to hold  lisinopril  2. Afib  - s/p DCCV yesterday  - on Eliquis and metop  3.  COPD:  - on inhalers  4.  Hyponatremia:  - mild, improving, expect to resolve when euvolemic  5.  Dispo: admitted Calvin Carroll 08/18/2022, 10:31 AM

## 2022-08-19 ENCOUNTER — Inpatient Hospital Stay (HOSPITAL_COMMUNITY): Payer: No Typology Code available for payment source

## 2022-08-19 ENCOUNTER — Encounter (HOSPITAL_COMMUNITY): Payer: Self-pay | Admitting: Internal Medicine

## 2022-08-19 DIAGNOSIS — I4891 Unspecified atrial fibrillation: Secondary | ICD-10-CM | POA: Diagnosis not present

## 2022-08-19 DIAGNOSIS — I10 Essential (primary) hypertension: Secondary | ICD-10-CM | POA: Diagnosis not present

## 2022-08-19 DIAGNOSIS — J441 Chronic obstructive pulmonary disease with (acute) exacerbation: Secondary | ICD-10-CM | POA: Diagnosis not present

## 2022-08-19 DIAGNOSIS — I5033 Acute on chronic diastolic (congestive) heart failure: Secondary | ICD-10-CM | POA: Diagnosis not present

## 2022-08-19 DIAGNOSIS — I4892 Unspecified atrial flutter: Secondary | ICD-10-CM | POA: Diagnosis not present

## 2022-08-19 DIAGNOSIS — J9601 Acute respiratory failure with hypoxia: Secondary | ICD-10-CM | POA: Diagnosis not present

## 2022-08-19 LAB — RENAL FUNCTION PANEL
Albumin: 2.6 g/dL — ABNORMAL LOW (ref 3.5–5.0)
Anion gap: 6 (ref 5–15)
BUN: 79 mg/dL — ABNORMAL HIGH (ref 8–23)
CO2: 26 mmol/L (ref 22–32)
Calcium: 8.9 mg/dL (ref 8.9–10.3)
Chloride: 100 mmol/L (ref 98–111)
Creatinine, Ser: 1.79 mg/dL — ABNORMAL HIGH (ref 0.61–1.24)
GFR, Estimated: 42 mL/min — ABNORMAL LOW (ref >60)
Glucose, Bld: 114 mg/dL — ABNORMAL HIGH (ref 70–99)
Phosphorus: 3 mg/dL (ref 2.5–4.6)
Potassium: 4.8 mmol/L (ref 3.5–5.1)
Sodium: 132 mmol/L — ABNORMAL LOW (ref 135–145)

## 2022-08-19 LAB — CBC
HCT: 38 % — ABNORMAL LOW (ref 39.0–52.0)
Hemoglobin: 12.8 g/dL — ABNORMAL LOW (ref 13.0–17.0)
MCH: 31.4 pg (ref 26.0–34.0)
MCHC: 33.7 g/dL (ref 30.0–36.0)
MCV: 93.4 fL (ref 80.0–100.0)
Platelets: 206 10*3/uL (ref 150–400)
RBC: 4.07 MIL/uL — ABNORMAL LOW (ref 4.22–5.81)
RDW: 13.6 % (ref 11.5–15.5)
WBC: 8.6 10*3/uL (ref 4.0–10.5)
nRBC: 0 % (ref 0.0–0.2)

## 2022-08-19 LAB — GLUCOSE, CAPILLARY: Glucose-Capillary: 146 mg/dL — ABNORMAL HIGH (ref 70–99)

## 2022-08-19 MED ORDER — PREDNISONE 5 MG PO TABS: 5.0000 mg | ORAL_TABLET | Freq: Every evening | ORAL | Status: DC

## 2022-08-19 MED ORDER — HYDRALAZINE HCL 50 MG PO TABS: 50.0000 mg | ORAL_TABLET | Freq: Three times a day (TID) | ORAL | 1 refills | Status: DC

## 2022-08-19 MED ORDER — METOPROLOL TARTRATE 50 MG PO TABS: 50.0000 mg | ORAL_TABLET | Freq: Two times a day (BID) | ORAL | Status: DC

## 2022-08-19 MED ORDER — PREDNISONE 10 MG PO TABS: ORAL_TABLET | ORAL | 0 refills | Status: AC

## 2022-08-19 MED ORDER — APIXABAN 2.5 MG PO TABS: 2.5000 mg | ORAL_TABLET | Freq: Two times a day (BID) | ORAL | 1 refills | Status: DC

## 2022-08-19 MED ORDER — METOPROLOL TARTRATE 50 MG PO TABS: 50.0000 mg | ORAL_TABLET | Freq: Two times a day (BID) | ORAL | 1 refills | Status: DC

## 2022-08-19 NOTE — Progress Notes (Signed)
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  Initial cause of ESRD was lead poisioning.  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  Baseline cr is 0.9-1.1, now with AKI from 1.5 on admission to 2.2--> down to 1.7             - likely hemodynamic in the setting of Afib, soft BP, diuresis             - agree with holding diuresis, giving IVFs             - low suspicion for rejection             - will check UP/C,renal transplant US--> all unremarkable             - continue baseline IS             - agree with continuing to hold lisinopril-- would restart as OP   2. Afib             - s/p DCCV 4/24             - on Eliquis and metop   3.  COPD:             - on inhalers   4.  Hyponatremia:             - mild, improving, expect to resolve when euvolemic   5.  Dispo: D/c today- agree  Subjective:    Seen in room.  Cr downtrending, transplant doppler OK.     Objective:   BP (!) 173/82   Pulse 63   Temp 98.2 F (36.8 C) (Oral)   Resp (!) 24   Ht 5\' 8"  (1.727 m)   Wt 48.9 kg   SpO2 92%   BMI 16.39 kg/m   Intake/Output Summary (Last 24 hours) at 08/19/2022 1309 Last data filed at 08/19/2022 1019 Gross per 24 hour  Intake 926.67 ml  Output 750 ml  Net 176.67 ml   Weight change: -0.1 kg  Physical Exam: GEN nad, sitting up in bed HEENT EOMI PERRL + glasses NECK no JVD PULM poor air movement bilaterally CV RRR ABD thin, soft, allograft RLQ and nontender EXT no LE edema NEURO Aao x 3 nonfocal SKIN warm and dry    Imaging: DG CHEST PORT 1 VIEW  Result Date: 08/19/2022 CLINICAL DATA:  Pulmonary edema EXAM: PORTABLE CHEST 1 VIEW COMPARISON:  Chest x-ray dated August 18, 2022 FINDINGS: Cardiac and mediastinal contours are unchanged. Unchanged diffuse interstitial opacities. Possible trace bilateral pleural effusions. No evidence of pneumothorax IMPRESSION: Unchanged pulmonary  edema. Electronically Signed   By: Allegra Lai M.D.   On: 08/19/2022 08:07   US Renal Transplant w/Doppler  Result Date: 08/18/2022 CLINICAL DATA:  Acute kidney injury EXAM: ULTRASOUND OF RENAL TRANSPLANT WITH RENAL DOPPLER ULTRASOUND TECHNIQUE: Ultrasound examination of the renal transplant was performed with gray-scale, color and duplex doppler evaluation. COMPARISON:  None Available. FINDINGS: Transplant kidney location: Right lower quadrant Transplant Kidney: Renal measurements: 11.9 x 5.7 x 6.4 cm = volume: 226.2mL. Normal in size and parenchymal echogenicity. No evidence of mass or hydronephrosis. No peri-transplant fluid collection seen. Color flow in the main renal artery: Preserved waveform. Preserved arborization on color Doppler. Color flow in the main renal vein:  Yes Duplex Doppler Evaluation: Main Renal Artery Velocity: 44.9 cm/sec Main Renal Artery Resistive Index: 0.8 Venous  waveform in main renal vein:  Present Intrarenal resistive index in upper pole:  0.82 (normal 0.6-0.8; equivocal 0.8-0.9; abnormal >= 0.9) Intrarenal resistive index in lower pole: 0.73 (normal 0.6-0.8; equivocal 0.8-0.9; abnormal >= 0.9) Bladder: Normal for degree of bladder distention. Other findings: Of note images were obtained of the native kidneys which are small and echogenic consistent with known previous correlate renal failure. There is a left-sided renal simple appearing cyst which is well-defined, thin wall than has through transmission. No specific follow-up. IMPRESSION: Right lower quadrant renal transplant. No collecting system dilatation or perinephric fluid. Preserved blood flow on Doppler. Of note please correlate with any prior examinations to assess for subtle change Electronically Signed   By: Karen Kays M.D.   On: 08/18/2022 15:35   DG CHEST PORT 1 VIEW  Result Date: 08/18/2022 CLINICAL DATA:  Shortness of breath. EXAM: PORTABLE CHEST 1 VIEW COMPARISON:  08/14/2022 and 08/12/2022 FINDINGS:  Coarse lung markings with underlying emphysema. Negative for pneumothorax. Prominent lung markings have minimally changed since 08/14/2022 but more prominent compared to 08/12/2022. Heart size is normal and stable. Atherosclerotic calcifications at the aortic arch. Trachea is midline. IMPRESSION: 1. Chronic lung changes compatible with emphysema. 2. Concern for increased interstitial lung markings which could represent interstitial edema and/or atypical infection. Electronically Signed   By: Richarda Overlie M.D.   On: 08/18/2022 12:22    Labs: BMET Recent Labs  Lab 08/14/22 0343 08/15/22 0513 08/16/22 0250 08/17/22 0448 08/18/22 0504 08/18/22 1514 08/19/22 0513  NA 128* 127* 127* 128* 131* 130* 132*  K 4.9 5.4* 5.0 5.0 4.7 5.2* 4.8  CL 96* 96* 94* 94* 96* 97* 100  CO2 23 23 24 25 26 25 26   GLUCOSE 194* 149* 209* 162* 107* 149* 114*  BUN 49* 57* 63* 74* 90* 90* 79*  CREATININE 1.36* 1.57* 1.69* 2.01* 2.20* 2.05* 1.79*  CALCIUM 9.0 9.0 8.8* 8.8* 8.9 8.9 8.9  PHOS  --   --   --   --   --   --  3.0   CBC Recent Labs  Lab 08/16/22 0250 08/17/22 0448 08/18/22 0504 08/19/22 0512  WBC 9.4 8.7 10.4 8.6  HGB 12.2* 12.6* 13.1 12.8*  HCT 36.2* 36.7* 38.5* 38.0*  MCV 93.5 92.4 92.8 93.4  PLT 230 236 224 206    Medications:     apixaban  2.5 mg Oral BID   arformoterol  15 mcg Nebulization BID   atorvastatin  40 mg Oral QHS   azaTHIOprine  50 mg Oral Daily   budesonide (PULMICORT) nebulizer solution  0.5 mg Nebulization BID   Chlorhexidine Gluconate Cloth  6 each Topical Daily   cyanocobalamin  1,000 mcg Oral Daily   hydrALAZINE  50 mg Oral Q8H   hydrocortisone cream   Topical TID   metoprolol tartrate  50 mg Oral BID   pantoprazole  40 mg Oral QAC supper   polyethylene glycol  17 g Oral Daily   predniSONE  50 mg Oral Q breakfast   revefenacin  175 mcg Nebulization Daily   senna  2 tablet Oral Daily   tacrolimus  1.5 mg Oral Daily   And   tacrolimus  2 mg Oral QHS    Bufford Buttner MD 08/19/2022, 1:09 PM

## 2022-08-19 NOTE — Discharge Summary (Addendum)
Physician Discharge Summary  Calvin Carroll ZOX:[id number] DOB: 06-26-1955 DOA: 08/12/2022  PCP: [facility]  Admit date: 08/12/2022 Discharge date: 08/19/2022  Admitted From:  Home  Disposition:  Home    Recommendations for Outpatient Follow-up:  Follow up with PCP in 1 weeks Follow up with cardiology as arranged  Follow up with nephrologist in 1 weeks for labs  Please obtain BMP in one week   Home Health: DME Home oxygen   Discharge Condition: STABLE   CODE STATUS: FULL DIET: resume prior heart healthy diet    Brief Hospitalization Summary: Please see all hospital notes, images, labs for full details of the hospitalization. ADMISSION HPI:  67 year old male with a history of renal transplant x 2 in 2003 and 2011, COPD, tobacco abuse, coronary artery disease with history of MI, atrial fibrillation, and hyperlipidemia presenting with 2-week history of shortness of breath, coughing, and chest congestion.  He states that he has been coughing up some yellow sputum, but denies any hemoptysis.  He has had some subjective sweats but has not taken his temperature.  Notably, the patient has a portable heart monitor that he has been using and has noted that he has had atrial fibrillation with RVR in the past 4 days.  He states that his blood pressure has been running soft with systolic BP in the low 100s.  He denies any syncope, but feels tired and short of breath.  He states that he did have some chest discomfort when he was having RVR with his atrial fibrillation.  At present, the patient denies any chest discomfort, nausea, vomiting or direct abdominal pain.  He continues to smoke slightly over 1 pack/day.  He states that he last picked up a cigarette on 08/08/2022.  Because of his atrial fibrillation and rapid heart rate he presented for further evaluation and treatment.  He denies any worsening lower extremity edema or orthopnea. In the ED, the patient was afebrile hemodynamically  stable with oxygen saturation 90% on room air.  WBC 10.5, hemoglobin 14.0, platelets 199,000.  Sodium 132, potassium 4.1, bicarbonate 24, serum creatinine 1.52.  COVID-19 PCR was negative.  BNP 1240.  Chest x-ray showed chronic interstitial markings and hyperinflation.  EKG showed atrial flutter with right bundle branch block.  The patient was started on diltiazem.  Cardiology was consulted to assist with management.  The patient subsequently developed RVR.  He was started on diltiazem drip.  His rate was controlled, but remained in atrial flutter.  Cardiology performed DCCV cardioversion on 08/17/2022 after which pt was in sinus rhythm.  HOSPITAL COURSE BY PROBLEM   Acute respiratory failure with hypoxia -Secondary COPD exacerbation -Initially placed on 2 L nasal cannula -Presented with hypoxia and tachypnea -Wean oxygen for saturation greater 92% -08/02/2022 CT chest--advanced emphysema; ill-defined opacity and reticular nodular change in the RLL>RML; RML opacities are new; mildly enlarged mediastinal lymph nodes -4/21 ReDS reading--57 -echo EF 60-65%, no WMA, normal RVF -08/17/22--weaned to RA after DCCV    COPD exacerbation -continue IV Solu-Medrol>>transitioned to po prednisone 4/24 -continue Pulmicort -Continue on duonebs -Continue Brovana -COVID/Flu/RSV neg -viral resp panel--neg -continue yupelri -DC home on prednisone taper, then resume daily 5 mg   AKI - likely prerenal - appreciate nephrology consultation - holding lisinopril temporarily  - gently hydrating with IV fluid - recheck renal function panel in 1 week with outpatient follow up  -renal function improved after hydration  -pt advised to NOT resume lisinopril until told by his nephrologist after repeating labs  Acute on chronic HFpEF -appreciate cardiology -continue IV lasix--last dose 4/23>>held now due to uptrend serum creatinine -weight 109.5>>105.1 lbs    Intake/Output Summary (Last 24 hours) at 08/19/2022  1256 Last data filed at 08/19/2022 1019 Gross per 24 hour  Intake 926.67 ml  Output 750 ml  Net 176.67 ml   Filed Weights   08/17/22 1241 08/18/22 0500 08/19/22 0500  Weight: 49 kg 49 kg 48.9 kg    Lobar pneumonia --08/02/2022 CT chest--advanced emphysema; ill-defined opacity and reticular nodular change in the RLL>RML; RML opacities are new; mildly enlarged mediastinal lymph nodes -continue ceftriaxone - completed course  -Continue doxycycline - completed course   -PCT 0.26>>0.13 -completed after 5 days  -pt qualified for home oxygen which has been arranged   Atrial fibrillation/flutter with RVR, type unspecified -pt states he is due to ablation on 11/07/22 -appreciate cardiology consult -restart metoprolol--increased to 3 times daily -continue diltiazem gtt>>now rate controlled, remains in a flutter -continue apixaban -obtain echo---echo EF 60-65%, no WMA, normal RVF -DCCV 4/24>>back to sinus -follow up outpatient for ablation  -ambulatory referral to Afib clinic made -dc home on metoprolol 50 mg BID per cardiology team    Renal transplant recipient -continue tacrolimus and azathioprine -no longer taking mycophenolate -on oral prednisone -nephrology team consulted 4/25 due to AKI   Tobacco abuse -tobacco cessation discussed   Hyperlipidemia -continue statin   CAD/elevated troponin -Secondary to demand ischemia -no chest pain presently -continue statin, metoprolol, apixaban   Hyponatremia -improved with diuresis     Discharge Diagnoses:  Principal Problem:   Atrial fibrillation with RVR (HCC) Active Problems:   COPD with acute exacerbation (HCC)   Acute respiratory failure with hypoxia (HCC)   Lobar pneumonia (HCC)   COPD exacerbation (HCC)   Shortness of breath   Atrial flutter (HCC)   Discharge Instructions: Discharge Instructions     Amb Referral to AFIB Clinic   Complete by: As directed    Hospital Follow Up      Allergies as of 08/19/2022   No  Known Allergies      Medication List     STOP taking these medications    B-12 500 MCG Tabs   benzonatate 100 MG capsule Commonly known as: TESSALON   lisinopril 40 MG tablet Commonly known as: ZESTRIL   mycophenolate 180 MG EC tablet Commonly known as: MYFORTIC       TAKE these medications    albuterol 108 (90 Base) MCG/ACT inhaler Commonly known as: VENTOLIN HFA Inhale 1-2 puffs into the lungs every 4 (four) hours as needed for wheezing or shortness of breath.   apixaban 2.5 MG Tabs tablet Commonly known as: ELIQUIS Take 1 tablet (2.5 mg total) by mouth 2 (two) times daily. What changed:  medication strength how much to take   atorvastatin 40 MG tablet Commonly known as: LIPITOR Take 40 mg by mouth at bedtime.   azaTHIOprine 50 MG tablet Commonly known as: IMURAN Take 1 tablet by mouth daily.   fexofenadine 180 MG tablet Commonly known as: ALLEGRA Take 180 mg by mouth daily as needed for allergies.   flunisolide 25 MCG/ACT (0.025%) Soln Commonly known as: NASALIDE Place 2 sprays into the nose 2 (two) times daily.   hydrALAZINE 50 MG tablet Commonly known as: APRESOLINE Take 1 tablet (50 mg total) by mouth 3 (three) times daily. What changed:  medication strength how much to take   metoprolol tartrate 50 MG tablet Commonly known as: LOPRESSOR Take 1 tablet (50  mg total) by mouth 2 (two) times daily. What changed: Another medication with the same name was removed. Continue taking this medication, and follow the directions you see here.   Mometasone Furoate 200 MCG/ACT Aero Inhale 2 each into the lungs in the morning and at bedtime.   MUCINEX PO Take 2 tablets by mouth every 4 (four) hours.   pantoprazole 40 MG tablet Commonly known as: PROTONIX Take 40 mg by mouth daily before supper.   predniSONE 10 MG tablet Commonly known as: DELTASONE Take 3 tabs daily with breakfast x 3 days, then 2 tabs QAM x 3 days, then 1 tab QAM x 3 days.  Then  resume daily 5 mg tablet Start taking on: August 20, 2022 What changed: You were already taking a medication with the same name, and this prescription was added. Make sure you understand how and when to take each.   predniSONE 5 MG tablet Commonly known as: DELTASONE Take 1 tablet (5 mg total) by mouth every evening. Start taking on: Aug 29, 2022 What changed: These instructions start on Aug 29, 2022. If you are unsure what to do until then, ask your doctor or other care provider.   Stiolto Respimat 2.5-2.5 MCG/ACT Aers Generic drug: Tiotropium Bromide-Olodaterol Inhale 2 each into the lungs daily.   tacrolimus 1 MG capsule Commonly known as: PROGRAF Take 1 mg by mouth See admin instructions. Take 1 mg in the morning (take with 0.5 mg to equal 1.5 mg in the morning) and 2 mg in the evening   tacrolimus 0.5 MG capsule Commonly known as: PROGRAF Take 0.5 mg by mouth See admin instructions. Take 0.5 mg daily in the morning with the 1 mg capsule to equal 1.5 mg in the morning   torsemide 20 MG tablet Commonly known as: DEMADEX Take 20 mg by mouth as needed (fluid).               Durable Medical Equipment  (From admission, onward)           Start     Ordered   08/19/22 1051  For home use only DME oxygen  Once       Comments: POC for COPD  Question Answer Comment  Length of Need Lifetime   Mode or (Route) Nasal cannula   Liters per Minute 2   Frequency Continuous (stationary and portable oxygen unit needed)   Oxygen conserving device Yes   Oxygen delivery system Gas      08/19/22 1050            Follow-up Information     [facility] Schedule an appointment as soon as possible for a visit in 1 week(s).   Why: Hospital Follow Up Contact information: [address] [facility] [place] [address] [phone]         Nephrologist. Schedule an appointment as soon as possible for a visit in 1 week(s).   Why: Hospital Follow Up         [facility]. Schedule an appointment as soon as possible for a visit in 2 week(s).   Specialty: Cardiology Why: Hospital Follow Up Contact information: [address] [id number] [place]he[place] [address] [phone]               No Known Allergies Allergies as of 08/19/2022   No Known Allergies      Medication List     STOP taking these medications  B-12 500 MCG Tabs   benzonatate 100 MG capsule Commonly known as: TESSALON   lisinopril 40 MG tablet Commonly known as: ZESTRIL   mycophenolate 180 MG EC tablet Commonly known as: MYFORTIC       TAKE these medications    albuterol 108 (90 Base) MCG/ACT inhaler Commonly known as: VENTOLIN HFA Inhale 1-2 puffs into the lungs every 4 (four) hours as needed for wheezing or shortness of breath.   apixaban 2.5 MG Tabs tablet Commonly known as: ELIQUIS Take 1 tablet (2.5 mg total) by mouth 2 (two) times daily. What changed:  medication strength how much to take   atorvastatin 40 MG tablet Commonly known as: LIPITOR Take 40 mg by mouth at bedtime.   azaTHIOprine 50 MG tablet Commonly known as: IMURAN Take 1 tablet by mouth daily.   fexofenadine 180 MG tablet Commonly known as: ALLEGRA Take 180 mg by mouth daily as needed for allergies.   flunisolide 25 MCG/ACT (0.025%) Soln Commonly known as: NASALIDE Place 2 sprays into the nose 2 (two) times daily.   hydrALAZINE 50 MG tablet Commonly known as: APRESOLINE Take 1 tablet (50 mg total) by mouth 3 (three) times daily. What changed:  medication strength how much to take   metoprolol tartrate 50 MG tablet Commonly known as: LOPRESSOR Take 1 tablet (50 mg total) by mouth 2 (two) times daily. What changed: Another medication with the same name was removed. Continue taking this medication, and follow the directions you see here.   Mometasone Furoate 200 MCG/ACT Aero Inhale 2 each  into the lungs in the morning and at bedtime.   MUCINEX PO Take 2 tablets by mouth every 4 (four) hours.   pantoprazole 40 MG tablet Commonly known as: PROTONIX Take 40 mg by mouth daily before supper.   predniSONE 10 MG tablet Commonly known as: DELTASONE Take 3 tabs daily with breakfast x 3 days, then 2 tabs QAM x 3 days, then 1 tab QAM x 3 days.  Then resume daily 5 mg tablet Start taking on: August 20, 2022 What changed: You were already taking a medication with the same name, and this prescription was added. Make sure you understand how and when to take each.   predniSONE 5 MG tablet Commonly known as: DELTASONE Take 1 tablet (5 mg total) by mouth every evening. Start taking on: Aug 29, 2022 What changed: These instructions start on Aug 29, 2022. If you are unsure what to do until then, ask your doctor or other care provider.   Stiolto Respimat 2.5-2.5 MCG/ACT Aers Generic drug: Tiotropium Bromide-Olodaterol Inhale 2 each into the lungs daily.   tacrolimus 1 MG capsule Commonly known as: PROGRAF Take 1 mg by mouth See admin instructions. Take 1 mg in the morning (take with 0.5 mg to equal 1.5 mg in the morning) and 2 mg in the evening   tacrolimus 0.5 MG capsule Commonly known as: PROGRAF Take 0.5 mg by mouth See admin instructions. Take 0.5 mg daily in the morning with the 1 mg capsule to equal 1.5 mg in the morning   torsemide 20 MG tablet Commonly known as: DEMADEX Take 20 mg by mouth as needed (fluid).               Durable Medical Equipment  (From admission, onward)           Start     Ordered   08/19/22 1051  For home use only DME oxygen  Once  Comments: POC for COPD  Question Answer Comment  Length of Need Lifetime   Mode or (Route) Nasal cannula   Liters per Minute 2   Frequency Continuous (stationary and portable oxygen unit needed)   Oxygen conserving device Yes   Oxygen delivery system Gas      08/19/22 1050             Procedures/Studies: DG CHEST PORT 1 VIEW  Result Date: 08/19/2022 CLINICAL DATA:  Pulmonary edema EXAM: PORTABLE CHEST 1 VIEW COMPARISON:  Chest x-ray dated August 18, 2022 FINDINGS: Cardiac and mediastinal contours are unchanged. Unchanged diffuse interstitial opacities. Possible trace bilateral pleural effusions. No evidence of pneumothorax IMPRESSION: Unchanged pulmonary edema. Electronically Signed   By: Allegra Lai M.D.   On: 08/19/2022 08:07   US Renal Transplant w/Doppler  Result Date: 08/18/2022 CLINICAL DATA:  Acute kidney injury EXAM: ULTRASOUND OF RENAL TRANSPLANT WITH RENAL DOPPLER ULTRASOUND TECHNIQUE: Ultrasound examination of the renal transplant was performed with gray-scale, color and duplex doppler evaluation. COMPARISON:  None Available. FINDINGS: Transplant kidney location: Right lower quadrant Transplant Kidney: Renal measurements: 11.9 x 5.7 x 6.4 cm = volume: 226.91mL. Normal in size and parenchymal echogenicity. No evidence of mass or hydronephrosis. No peri-transplant fluid collection seen. Color flow in the main renal artery: Preserved waveform. Preserved arborization on color Doppler. Color flow in the main renal vein:  Yes Duplex Doppler Evaluation: Main Renal Artery Velocity: 44.9 cm/sec Main Renal Artery Resistive Index: 0.8 Venous waveform in main renal vein:  Present Intrarenal resistive index in upper pole:  0.82 (normal 0.6-0.8; equivocal 0.8-0.9; abnormal >= 0.9) Intrarenal resistive index in lower pole: 0.73 (normal 0.6-0.8; equivocal 0.8-0.9; abnormal >= 0.9) Bladder: Normal for degree of bladder distention. Other findings: Of note images were obtained of the native kidneys which are small and echogenic consistent with known previous correlate renal failure. There is a left-sided renal simple appearing cyst which is well-defined, thin wall than has through transmission. No specific follow-up. IMPRESSION: Right lower quadrant renal transplant. No collecting system  dilatation or perinephric fluid. Preserved blood flow on Doppler. Of note please correlate with any prior examinations to assess for subtle change Electronically Signed   By: Karen Kays M.D.   On: 08/18/2022 15:35   DG CHEST PORT 1 VIEW  Result Date: 08/18/2022 CLINICAL DATA:  Shortness of breath. EXAM: PORTABLE CHEST 1 VIEW COMPARISON:  08/14/2022 and 08/12/2022 FINDINGS: Coarse lung markings with underlying emphysema. Negative for pneumothorax. Prominent lung markings have minimally changed since 08/14/2022 but more prominent compared to 08/12/2022. Heart size is normal and stable. Atherosclerotic calcifications at the aortic arch. Trachea is midline. IMPRESSION: 1. Chronic lung changes compatible with emphysema. 2. Concern for increased interstitial lung markings which could represent interstitial edema and/or atypical infection. Electronically Signed   By: Richarda Overlie M.D.   On: 08/18/2022 12:22   ECHOCARDIOGRAM COMPLETE  Result Date: 08/15/2022    ECHOCARDIOGRAM REPORT   Patient Name:   Calvin Carroll Date of Exam: 08/15/2022 Medical Rec #:  [id number]        Height:       68.0 in Accession #:    [id number]       Weight:       105.2 lb Date of Birth:  August 20, 1955       BSA:          1.555 m Patient Age:    66 years         BP:  151/90 mmHg Patient Gender: M                HR:           67 bpm. [facility] Procedure: 2D Echo, Cardiac Doppler and Color Doppler Indications:    Atrial Fibrillation I48.91  History:        Patient has prior history of Echocardiogram examinations, most                 recent 05/18/2021. Previous Myocardial Infarction, COPD,                 Arrythmias:Atrial Fibrillation; Risk Factors:Hypertension. Hx of                 COVID-19 virus infection.  Sonographer:    Celesta Gentile RCS Referring Phys: [id number] DAVID TAT IMPRESSIONS  1. Left ventricular ejection fraction, by estimation, is 60 to 65%. The left ventricle has normal function. The left ventricle has no  regional wall motion abnormalities. Left ventricular diastolic function could not be evaluated due to atrial arrythmia.  The E/e' is 26.9.  2. Right ventricular systolic function is normal. The right ventricular size is normal. Tricuspid regurgitation signal is inadequate for assessing PA pressure.  3. Left atrial size was severely dilated.  4. Right atrial size was moderately dilated.  5. The mitral valve is abnormal. Trivial mitral valve regurgitation. No evidence of mitral stenosis. Moderate to severe mitral annular calcification.  6. The aortic valve was not well visualized. Visually, aortic valve regurgitation is mild but PHT measures 364 msec likely from diastolic dysfunction. Mild aortic valve stenosis. Aortic valve area, by VTI measures 1.03 cm. Aortic valve mean gradient measures 14.0 mmHg. Aortic valve Vmax measures 2.43 m/s.  7. Aortic dilatation noted. There is borderline dilatation of the aortic root, measuring 37 mm.  8. The inferior vena cava is dilated in size with <50% respiratory variability, suggesting right atrial pressure of 15 mmHg.  9. Evidence of atrial level shunting detected by color flow Doppler. There is a small patent foramen ovale with predominantly left to right shunting across the atrial septum. Comparison(s): No significant change from prior study. FINDINGS  Left Ventricle: Left ventricular ejection fraction, by estimation, is 60 to 65%. The left ventricle has normal function. The left ventricle has no regional wall motion abnormalities. The left ventricular internal cavity size was normal in size. There is  no left ventricular hypertrophy. Left ventricular diastolic function could not be evaluated due to atrial fibrillation. Left ventricular diastolic function could not be evaluated. The E/e' is 26.9. Right Ventricle: The right ventricular size is normal. No increase in right ventricular wall thickness. Right ventricular systolic function is normal. Tricuspid regurgitation signal  is inadequate for assessing PA pressure. Left Atrium: Left atrial size was severely dilated. Right Atrium: Right atrial size was moderately dilated. Pericardium: There is no evidence of pericardial effusion. Mitral Valve: The mitral valve is abnormal. Moderate to severe mitral annular calcification. Trivial mitral valve regurgitation. No evidence of mitral valve stenosis. Tricuspid Valve: The tricuspid valve is normal in structure. Tricuspid valve regurgitation is trivial. No evidence of tricuspid stenosis. Aortic Valve: The aortic valve was not well visualized. Aortic valve regurgitation is mild. Aortic regurgitation PHT measures 364 msec. Mild aortic stenosis is present. Aortic valve mean gradient measures 14.0 mmHg. Aortic valve peak gradient measures 23.6 mmHg. Aortic valve area, by VTI measures 1.03 cm. Pulmonic Valve: The pulmonic valve was not well visualized. Pulmonic valve  regurgitation is not visualized. No evidence of pulmonic stenosis. Aorta: Aortic dilatation noted. There is borderline dilatation of the aortic root, measuring 37 mm. Venous: The inferior vena cava is dilated in size with less than 50% respiratory variability, suggesting right atrial pressure of 15 mmHg. IAS/Shunts: Evidence of atrial level shunting detected by color flow Doppler. A small patent foramen ovale is detected with predominantly left to right shunting across the atrial septum.  LEFT VENTRICLE PLAX 2D LVIDd:         3.90 cm   Diastology LVIDs:         2.60 cm   LV e' medial:    6.20 cm/s LV PW:         0.90 cm   LV E/e' medial:  26.9 LV IVS:        1.20 cm   LV e' lateral:   7.94 cm/s LVOT diam:     1.90 cm   LV E/e' lateral: 21.0 LV SV:         58 LV SV Index:   37 LVOT Area:     2.84 cm  RIGHT VENTRICLE RV S prime:     12.20 cm/s TAPSE (M-mode): 2.0 cm LEFT ATRIUM              Index        RIGHT ATRIUM           Index LA diam:        4.20 cm  2.70 cm/m   RA Area:     24.00 cm LA Vol (A2C):   101.0 ml 64.95 ml/m  RA  Volume:   74.50 ml  47.91 ml/m LA Vol (A4C):   102.0 ml 65.59 ml/m LA Biplane Vol: 103.0 ml 66.23 ml/m  AORTIC VALVE AV Area (Vmax):    0.94 cm AV Area (Vmean):   0.94 cm AV Area (VTI):     1.03 cm AV Vmax:           243.00 cm/s AV Vmean:          181.000 cm/s AV VTI:            0.564 m AV Peak Grad:      23.6 mmHg AV Mean Grad:      14.0 mmHg LVOT Vmax:         80.40 cm/s LVOT Vmean:        60.300 cm/s LVOT VTI:          0.204 m LVOT/AV VTI ratio: 0.36 AI PHT:            364 msec  AORTA Ao Root diam: 3.70 cm MITRAL VALVE MV Area (PHT): 5.54 cm     SHUNTS MV Decel Time: 137 msec     Systemic VTI:  0.20 m MV E velocity: 167.00 cm/s  Systemic Diam: 1.90 cm MV A velocity: 52.80 cm/s MV E/A ratio:  3.16 Vishnu Priya Mallipeddi Electronically signed by Winfield Rast Mallipeddi Signature Date/Time: 08/15/2022/3:06:35 PM    Final    DG Chest 1 View  Result Date: 08/14/2022 CLINICAL DATA:  Shortness of breath EXAM: CHEST  1 VIEW COMPARISON:  08/12/2022 FINDINGS: Bilateral interstitial opacities with mild hyperinflation. Normal cardiomediastinal contours and pleural spaces. IMPRESSION: Bilateral interstitial opacities with mild hyperinflation, likely due to COPD. Electronically Signed   By: Deatra Robinson M.D.   On: 08/14/2022 19:55   CT CHEST WO CONTRAST  Result Date: 08/12/2022 CLINICAL DATA:  Dyspnea.  Chronic cough.  COPD.  EXAM: CT CHEST WITHOUT CONTRAST TECHNIQUE: Multidetector CT imaging of the chest was performed following the standard protocol without IV contrast. RADIATION DOSE REDUCTION: This exam was performed according to the departmental dose-optimization program which includes automated exposure control, adjustment of the mA and/or kV according to patient size and/or use of iterative reconstruction technique. COMPARISON:  X-ray 08/12/2022 and older.  CT angiogram 05/17/2021 FINDINGS: Cardiovascular: Trace pericardial fluid. Heart is nonenlarged. Coronary artery calcifications are seen. There also  calcifications along the area of the aortic valve and mitral valve annulus. The thoracic aorta on this noncontrast exam has a normal course and caliber with prominent calcified plaque including along the origin of the great vessels, in particular the left subclavian artery. Mediastinum/Nodes: No specific abnormal lymph node enlargement identified in the axillary region or hilum. There are some prominent to mildly enlarged mediastinal nodes however for example right paratracheal posteriorly along the upper mediastinum on series 2, image 28 measuring 17 by 9 mm, precarinal on series 2, image 65 measuring 2.2 by 1.1 cm. These are unchanged from the previous examination. Other nodes are present as well left paratracheal, subcarinal. There are some lymph nodes in the mediastinum and hila with calcifications. Lungs/Pleura: Centrilobular emphysematous lung changes are identified. There are calcified lung nodules bilaterally consistent with old granulomatous disease. No pneumothorax or effusion. There is some ill-defined reticular parenchymal opacities identified in the right lower lobe, middle lobe and lingula. Areas in the right lower lobe and lingula were seen on the previous examination could be more chronic. There are some associated areas of bronchial wall thickening. The dependent ill-defined areas in the middle lobe are new from the examination of 2023. There is a noncalcified nodule measuring 4 mm in the inferior aspect of the right upper lobe on series 4, image 73 which is stable on the prior and by report been stable since 2018 demonstrating long-term stability. Upper Abdomen: Along the upper abdomen the kidneys are severely atrophic. Slight thickening of the left adrenal gland is stable. Musculoskeletal: Mild degenerative changes are seen along the spine. IMPRESSION: Advanced emphysematous lung changes. There is some ill-defined opacity and reticular changes involving the right lower lobe greater than middle  lobe and lingula. The areas in the right lower lobe and lingula were seen on the study of 2023. This could be chronic versus a recurrent acute process. The areas in the middle lobe were not seen at the time. Again an acute process is possible and recommend follow-up to confirm clearance. Evidence of old granulomatous disease. Stable mildly enlarged mediastinal lymph nodes Aortic Atherosclerosis (ICD10-I70.0) and Emphysema (ICD10-J43.9). Electronically Signed   By: Karen Kays M.D.   On: 08/12/2022 15:50   DG Chest Port 1 View  Result Date: 08/12/2022 CLINICAL DATA:  Shortness of breath EXAM: PORTABLE CHEST 1 VIEW COMPARISON:  Radiograph 05/26/2021, 05/17/2021 FINDINGS: Unchanged cardiomediastinal silhouette. Emphysema with prominent interstitial opacities and peripheral reticulation, similar to previous exams. No new focal airspace disease. No large effusion or evidence of pneumothorax. IMPRESSION: Emphysema with chronic interstitial changes. No new focal airspace disease. Electronically Signed   By: Caprice Renshaw M.D.   On: 08/12/2022 11:51     Subjective: Pt reports that he wants to go home, he is feeling well, he is agreeable to home oxygen use, he will [facility]rolog[facility].    Discharge Exam: Vitals:   08/19/22 1124 08/19/22 1200  BP:  (!) 173/82  Pulse:  63  Resp:  [name] Kitchen)  24  Temp: 98.2 F (36.8 C)   SpO2:  92%   Vitals:   08/19/22 1000 08/19/22 1100 08/19/22 1124 08/19/22 1200  BP: (!) 176/75 (!) 180/83  (!) 173/82  Pulse: (!) 59 62  63  Resp: 19 20  (!) 24  Temp:   98.2 F (36.8 C)   TempSrc:   Oral   SpO2: 91% 91%  92%  Weight:      Height:       General: Pt is alert, awake, not in acute distress Cardiovascular: normal S1/S2 +, no rubs, no gallops Respiratory:  no wheezing, no rhonchi Abdominal: Soft, NT, ND, bowel sounds + Extremities: no edema, no cyanosis   The results of significant diagnostics from this hospitalization  (including imaging, microbiology, ancillary and laboratory) are listed below for reference.     Microbiology: Recent Results (from the past 240 hour(s))  Resp panel by RT-PCR (RSV, Flu A&B, Covid) Anterior Nasal Swab     Status: None   Collection Time: 08/12/22 11:34 AM   Specimen: Anterior Nasal Swab  Result Value Ref Range Status   SARS Coronavirus 2 by RT PCR NEGATIVE NEGATIVE Final    Comment: (NOTE) SARS-CoV-2 target nucleic acids are NOT DETECTED.  The SARS-CoV-2 RNA is generally detectable in upper respiratory specimens during the acute phase of infection. The lowest concentration of SARS-CoV-2 viral copies this assay can detect is 138 copies/mL. A negative result does not preclude SARS-Cov-2 infection and should not be used as the sole basis for treatment or other patient management decisions. A negative result may occur with  improper specimen collection/handling, submission of specimen other than nasopharyngeal swab, presence of viral mutation(s) within the areas targeted by this assay, and inadequate number of viral copies(<138 copies/mL). A negative result must be combined with clinical observations, patient history, and epidemiological information. The expected result is Negative.  Fact Sheet for Patients:  BloggerCourse.com  Fact Sheet for Healthcare Providers:  SeriousBroker.it  This test is no t yet approved or cleared by the [place] FDA and  has been authorized for detection and/or diagnosis of SARS-CoV-2 by FDA under an Emergency Use Authorization (EUA). This EUA will remain  in effect (meaning this test can be used) for the duration of the COVID-19 declaration under Section 564(b)(1) of the Act, 21 U.S.C.section 360bbb-3(b)(1), unless the authorization is terminated  or revoked sooner.       Influenza A by PCR NEGATIVE NEGATIVE Final   Influenza B by PCR NEGATIVE NEGATIVE Final    Comment:  (NOTE) The Xpert Xpress SARS-CoV-2/FLU/RSV plus assay is intended as an aid in the diagnosis of influenza from Nasopharyngeal swab specimens and should not be used as a sole basis for treatment. Nasal washings and aspirates are unacceptable for Xpert Xpress SARS-CoV-2/FLU/RSV testing.  Fact Sheet for Patients: BloggerCourse.com  Fact Sheet for Healthcare Providers: SeriousBroker.it  This test is not yet approved or cleared by the [place] FDA and has been authorized for detection and/or diagnosis of SARS-CoV-2 by FDA under an Emergency Use Authorization (EUA). This EUA will remain in effect (meaning this test can be used) for the duration of the COVID-19 declaration under Section 564(b)(1) of the Act, 21 U.S.C. section 360bbb-3(b)(1), unless the authorization is terminated or revoked.     Resp Syncytial Virus by PCR NEGATIVE NEGATIVE Final    Comment: (NOTE) Fact Sheet for Patients: BloggerCourse.com  Fact Sheet for Healthcare Providers: SeriousBroker.it  This test is not yet approved or cleared by the [place] FDA and has been authorized for detection and/or diagnosis of SARS-CoV-2 by FDA under an Emergency Use Authorization (EUA). This EUA will remain in effect (meaning this test can be used) for the duration of the COVID-19 declaration under Section 564(b)(1) of the Act, 21 U.S.C. section 360bbb-3(b)(1), unless the authorization is terminated or revoked.  Performed at [facility], [address] [place], [place] [address]   Respiratory (~20 pathogens) panel by PCR     Status: None   Collection Time: 08/12/22  3:52 PM   Specimen: Nasopharyngeal Swab; Respiratory  Result Value Ref Range Status   Adenovirus NOT DETECTED NOT DETECTED Final   Coronavirus 229E NOT DETECTED NOT DETECTED Final    Comment: (NOTE) The Coronavirus on the Respiratory Panel, DOES NOT test  for the novel  Coronavirus (2019 nCoV)    Coronavirus HKU1 NOT DETECTED NOT DETECTED Final   Coronavirus NL63 NOT DETECTED NOT DETECTED Final   Coronavirus OC43 NOT DETECTED NOT DETECTED Final   Metapneumovirus NOT DETECTED NOT DETECTED Final   Rhinovirus / Enterovirus NOT DETECTED NOT DETECTED Final   Influenza A NOT DETECTED NOT DETECTED Final   Influenza B NOT DETECTED NOT DETECTED Final   Parainfluenza Virus 1 NOT DETECTED NOT DETECTED Final   Parainfluenza Virus 2 NOT DETECTED NOT DETECTED Final   Parainfluenza Virus 3 NOT DETECTED NOT DETECTED Final   Parainfluenza Virus 4 NOT DETECTED NOT DETECTED Final   Respiratory Syncytial Virus NOT DETECTED NOT DETECTED Final   Bordetella pertussis NOT DETECTED NOT DETECTED Final   Bordetella Parapertussis NOT DETECTED NOT DETECTED Final   Chlamydophila pneumoniae NOT DETECTED NOT DETECTED Final   Mycoplasma pneumoniae NOT DETECTED NOT DETECTED Final    Comment: Performed at [facility], [address] [place], [place] [address]  MRSA Next Gen by PCR, Nasal     Status: None   Collection Time: 08/12/22  3:52 PM   Specimen: Nasal Mucosa; Nasal Swab  Result Value Ref Range Status   MRSA by PCR Next Gen NOT DETECTED NOT DETECTED Final    Comment: (NOTE) The GeneXpert MRSA Assay (FDA approved for NASAL specimens only), is one component of a comprehensive MRSA colonization surveillance program. It is not intended to diagnose MRSA infection nor to guide or monitor treatment for MRSA infections. Test performance is not FDA approved in patients less than 28 years old. Performed at [facility], [address], [place], [place] [address]      Labs: BNP (last 3 results) Recent Labs    08/12/22 1124 08/14/22 1500  BNP 1,240.0* 678.0*   Basic Metabolic Panel: Recent Labs  Lab 08/13/22 1257 08/14/22 0343 08/15/22 0513 08/16/22 0250 08/17/22 0448 08/18/22 0504 08/18/22 1514 08/19/22 0513  NA 130* 128* 127* 127* 128* 131* 130*  132*  K 4.6 4.9 5.4* 5.0 5.0 4.7 5.2* 4.8  CL 97* 96* 96* 94* 94* 96* 97* 100  CO2 21* 23 23 24 25 26 25 26   GLUCOSE 236* 194* 149* 209* 162* 107* 149* 114*  BUN 46* 49* 57* 63* 74* 90* 90* 79*  CREATININE 1.44* 1.36* 1.57* 1.69* 2.01* 2.20* 2.05* 1.79*  CALCIUM 8.8* 9.0 9.0 8.8* 8.8* 8.9 8.9 8.9  MG 1.9 1.9 1.8  --   --   --   --   --   PHOS  --   --   --   --   --   --   --  3.0   Liver Function Tests: Recent Labs  Lab 08/19/22 0513  ALBUMIN 2.6*   No results for input(s): "LIPASE", "AMYLASE" in the last 168 hours. No results for input(s): "AMMONIA" in the last 168 hours. CBC: Recent Labs  Lab 08/15/22 0513 08/16/22 0250 08/17/22 0448 08/18/22 0504 08/19/22 0512  WBC 10.7* 9.4 8.7 10.4 8.6  HGB 11.7* 12.2* 12.6* 13.1 12.8*  HCT 34.9* 36.2* 36.7* 38.5* 38.0*  MCV 94.3 93.5 92.4 92.8 93.4  PLT 243 230 236 224 206   Cardiac Enzymes: No results for input(s): "CKTOTAL", "CKMB", "CKMBINDEX", "TROPONINI" in the last 168 hours. BNP: Invalid input(s): "POCBNP" CBG: Recent Labs  Lab 08/19/22 1202  GLUCAP 146*   D-Dimer No results for input(s): "DDIMER" in the last 72 hours. Hgb A1c No results for input(s): "HGBA1C" in the last 72 hours. Lipid Profile No results for input(s): "CHOL", "HDL", "LDLCALC", "TRIG", "CHOLHDL", "LDLDIRECT" in the last 72 hours. Thyroid function studies No results for input(s): "TSH", "T4TOTAL", "T3FREE", "THYROIDAB" in the last 72 hours.  Invalid input(s): "FREET3" Anemia work up No results for input(s): "VITAMINB12", "FOLATE", "FERRITIN", "TIBC", "IRON", "RETICCTPCT" in the last 72 hours. Urinalysis    Component Value Date/Time   COLORURINE AMBER (A) 12/03/2016 1026   APPEARANCEUR CLEAR 12/03/2016 1026   LABSPEC 1.023 12/03/2016 1026   PHURINE 5.0 12/03/2016 1026   GLUCOSEU NEGATIVE 12/03/2016 1026   HGBUR SMALL (A) 12/03/2016 1026   BILIRUBINUR NEGATIVE 12/03/2016 1026   KETONESUR 5 (A) 12/03/2016 1026   PROTEINUR >=300 (A)  12/03/2016 1026   NITRITE NEGATIVE 12/03/2016 1026   LEUKOCYTESUR NEGATIVE 12/03/2016 1026   Sepsis Labs Recent Labs  Lab 08/16/22 0250 08/17/22 0448 08/18/22 0504 08/19/22 0512  WBC 9.4 8.7 10.4 8.6   Microbiology Recent Results (from the past 240 hour(s))  Resp panel by RT-PCR (RSV, Flu A&B, Covid) Anterior Nasal Swab     Status: None   Collection Time: 08/12/22 11:34 AM   Specimen: Anterior Nasal Swab  Result Value Ref Range Status   SARS Coronavirus 2 by RT PCR NEGATIVE NEGATIVE Final    Comment: (NOTE) SARS-CoV-2 target nucleic acids are NOT DETECTED.  The SARS-CoV-2 RNA is generally detectable in upper respiratory specimens during the acute phase of infection. The lowest concentration of SARS-CoV-2 viral copies this assay can detect is 138 copies/mL. A negative result does not preclude SARS-Cov-2 infection and should not be used as the sole basis for treatment or other patient management decisions. A negative result may occur with  improper specimen collection/handling, submission of specimen other than nasopharyngeal swab, presence of viral mutation(s) within the areas targeted by this assay, and inadequate number of viral copies(<138 copies/mL). A negative result must be combined with clinical observations, patient history, and epidemiological information. The expected result is Negative.  Fact Sheet for Patients:  BloggerCourse.com  Fact Sheet for Healthcare Providers:  SeriousBroker.it  This test is no t yet approved or cleared by the [place] FDA and  has been authorized for detection and/or diagnosis of SARS-CoV-2 by FDA under an Emergency Use Authorization (EUA). This EUA will remain  in effect (meaning this test can be used) for the duration of the COVID-19 declaration under Section 564(b)(1) of the Act, 21 U.S.C.section 360bbb-3(b)(1), unless the authorization is terminated  or revoked sooner.        Influenza A by PCR NEGATIVE NEGATIVE Final   Influenza B by PCR NEGATIVE NEGATIVE Final    Comment: (NOTE) The Xpert Xpress SARS-CoV-2/FLU/RSV plus assay is intended as an aid in the diagnosis of influenza  from Nasopharyngeal swab specimens and should not be used as a sole basis for treatment. Nasal washings and aspirates are unacceptable for Xpert Xpress SARS-CoV-2/FLU/RSV testing.  Fact Sheet for Patients: BloggerCourse.com  Fact Sheet for Healthcare Providers: SeriousBroker.it  This test is not yet approved or cleared by the [place] FDA and has been authorized for detection and/or diagnosis of SARS-CoV-2 by FDA under an Emergency Use Authorization (EUA). This EUA will remain in effect (meaning this test can be used) for the duration of the COVID-19 declaration under Section 564(b)(1) of the Act, 21 U.S.C. section 360bbb-3(b)(1), unless the authorization is terminated or revoked.     Resp Syncytial Virus by PCR NEGATIVE NEGATIVE Final    Comment: (NOTE) Fact Sheet for Patients: BloggerCourse.com  Fact Sheet for Healthcare Providers: SeriousBroker.it  This test is not yet approved or cleared by the [place] FDA and has been authorized for detection and/or diagnosis of SARS-CoV-2 by FDA under an Emergency Use Authorization (EUA). This EUA will remain in effect (meaning this test can be used) for the duration of the COVID-19 declaration under Section 564(b)(1) of the Act, 21 U.S.C. section 360bbb-3(b)(1), unless the authorization is terminated or revoked.  Performed at [facility], [address], [place], [place] [address]   Respiratory (~20 pathogens) panel by PCR     Status: None   Collection Time: 08/12/22  3:52 PM   Specimen: Nasopharyngeal Swab; Respiratory  Result Value Ref Range Status   Adenovirus NOT DETECTED NOT DETECTED Final   Coronavirus 229E NOT  DETECTED NOT DETECTED Final    Comment: (NOTE) The Coronavirus on the Respiratory Panel, DOES NOT test for the novel  Coronavirus (2019 nCoV)    Coronavirus HKU1 NOT DETECTED NOT DETECTED Final   Coronavirus NL63 NOT DETECTED NOT DETECTED Final   Coronavirus OC43 NOT DETECTED NOT DETECTED Final   Metapneumovirus NOT DETECTED NOT DETECTED Final   Rhinovirus / Enterovirus NOT DETECTED NOT DETECTED Final   Influenza A NOT DETECTED NOT DETECTED Final   Influenza B NOT DETECTED NOT DETECTED Final   Parainfluenza Virus 1 NOT DETECTED NOT DETECTED Final   Parainfluenza Virus 2 NOT DETECTED NOT DETECTED Final   Parainfluenza Virus 3 NOT DETECTED NOT DETECTED Final   Parainfluenza Virus 4 NOT DETECTED NOT DETECTED Final   Respiratory Syncytial Virus NOT DETECTED NOT DETECTED Final   Bordetella pertussis NOT DETECTED NOT DETECTED Final   Bordetella Parapertussis NOT DETECTED NOT DETECTED Final   Chlamydophila pneumoniae NOT DETECTED NOT DETECTED Final   Mycoplasma pneumoniae NOT DETECTED NOT DETECTED Final    Comment: Performed at [facility], [address] [place], [place] [address]  MRSA Next Gen by PCR, Nasal     Status: None   Collection Time: 08/12/22  3:52 PM   Specimen: Nasal Mucosa; Nasal Swab  Result Value Ref Range Status   MRSA by PCR Next Gen NOT DETECTED NOT DETECTED Final    Comment: (NOTE) The GeneXpert MRSA Assay (FDA approved for NASAL specimens only), is one component of a comprehensive MRSA colonization surveillance program. It is not intended to diagnose MRSA infection nor to guide or monitor treatment for MRSA infections. Test performance is not FDA approved in patients less than 80 years old. Performed at [facility], [address], [place], [place] [address]     Time coordinating discharge: 42 mins   SIGNED:  Standley Dakins, MD  [facility] 08/19/2022, 12:56 PM How to contact the [facility] Attending or Consulting provider 7A -  7P or covering  provider during after hours 7P -7A, for this patient?  Check the care team in [facility] and look for a) attending/consulting [facility] provider listed and b) the [facility] team listed Log into [email or web address] and use [place]'s universal password to access. If you do not have the password, please contact the hospital operator. Locate the [facility] provider you are looking for under [facility] and page to a number that you can be directly reached. If you still have difficulty reaching the provider, please page the [facility] (Director on Call) for the Hospitalists listed on amion for assistance.

## 2022-08-19 NOTE — TOC Transition Note (Signed)
Transition of Care [facility]) - CM/SW Discharge Note   Patient Details  Name: SHRAVAN SALAHUDDIN MRN: [id number] Date of Birth: 05-13-1955  Transition of Care [facility]) CM/SW Contact:  Elliot Gault, LCSW Phone Number: 08/19/2022, 11:06 AM   Clinical Narrative:     Pt medically stable for dc home today per MD. Pt in need of home O2 for dc. Spoke with pt to follow up on provider information from the [place] and [employer]. Pt states he would like the POC from [employer] and he is comfortable with the monthly co-pay amount.  Portable tank brought to pt's room. [facility] rep will contact pt to coordinate delivery of POC and home equipment.   Pt reports that he does not have any other TOC needs for dc.  Final next level of care: Home/Self Care Barriers to Discharge: Barriers Resolved   Patient Goals and CMS Choice CMS Medicare.gov Compare Post Acute Care list provided to:: Patient Choice offered to / list presented to : Patient  Discharge Placement                         Discharge Plan and Services Additional resources added to the After Visit Summary for   In-house Referral: Clinical Social Work   Post Acute Care Choice: Durable Medical Equipment          DME Arranged: Oxygen DME Agency: Patsy Lager Date DME Agency Contacted: 08/19/22   Representative spoke with at DME Agency: Morrie Sheldon            Social Determinants of Health (SDOH) Interventions SDOH Screenings   Food Insecurity: No Food Insecurity (08/12/2022)  Housing: Low Risk  (08/12/2022)  Transportation Needs: No Transportation Needs (08/12/2022)  Utilities: Not At Risk (08/12/2022)  Tobacco Use: High Risk (08/17/2022)     Readmission Risk Interventions    08/17/2022   11:20 AM  Readmission Risk Prevention Plan  Transportation Screening Complete  Home Care Screening Complete  Medication Review (RN CM) Complete

## 2022-08-19 NOTE — Progress Notes (Addendum)
SATURATION QUALIFICATIONS: (This note is used to comply with regulatory documentation for home oxygen)  Patient Saturations on Room Air at Rest = 90%  Patient Saturations on Room Air while Ambulating = 87%  Patient Saturations on 2 Liters of oxygen while Ambulating = 95%  Please briefly explain why patient needs home oxygen: Work of breathing increased with activity needing extra O2 support.

## 2022-08-19 NOTE — Discharge Instructions (Signed)
PLEASE DO NOT RESTART LISINOPRIL UNTIL IT IS OK WITH YOUR KIDNEY DOCTOR AFTER RECHECKING  YOUR LAB WORK.   PLEASE SEE YOUR KIDNEY DOCTOR IN NEXT 1-2 WEEKS FOR LAB WORK.    IMPORTANT INFORMATION: PAY CLOSE ATTENTION   PHYSICIAN DISCHARGE INSTRUCTIONS  Follow with Primary care provider  Clinic, Lenn Sink  and other consultants as instructed by your Hospitalist Physician  SEEK MEDICAL CARE OR RETURN TO EMERGENCY ROOM IF SYMPTOMS COME BACK, WORSEN OR NEW PROBLEM DEVELOPS   Please note: You were cared for by a hospitalist during your hospital stay. Every effort will be made to forward records to your primary care provider.  You can request that your primary care provider send for your hospital records if they have not received them.  Once you are discharged, your primary care physician will handle any further medical issues. Please note that NO REFILLS for any discharge medications will be authorized once you are discharged, as it is imperative that you return to your primary care physician (or establish a relationship with a primary care physician if you do not have one) for your post hospital discharge needs so that they can reassess your need for medications and monitor your lab values.  Please get a complete blood count and chemistry panel checked by your Primary MD at your next visit, and again as instructed by your Primary MD.  Get Medicines reviewed and adjusted: Please take all your medications with you for your next visit with your Primary MD  Laboratory/radiological data: Please request your Primary MD to go over all hospital tests and procedure/radiological results at the follow up, please ask your primary care provider to get all Hospital records sent to his/her office.  In some cases, they will be blood work, cultures and biopsy results pending at the time of your discharge. Please request that your primary care provider follow up on these results.  If you are diabetic, please  bring your blood sugar readings with you to your follow up appointment with primary care.    Please call and make your follow up appointments as soon as possible.    Also Note the following: If you experience worsening of your admission symptoms, develop shortness of breath, life threatening emergency, suicidal or homicidal thoughts you must seek medical attention immediately by calling 911 or calling your MD immediately  if symptoms less severe.  You must read complete instructions/literature along with all the possible adverse reactions/side effects for all the Medicines you take and that have been prescribed to you. Take any new Medicines after you have completely understood and accpet all the possible adverse reactions/side effects.   Do not drive when taking Pain medications or sleeping medications (Benzodiazepines)  Do not take more than prescribed Pain, Sleep and Anxiety Medications. It is not advisable to combine anxiety,sleep and pain medications without talking with your primary care practitioner  Special Instructions: If you have smoked or chewed Tobacco  in the last 2 yrs please stop smoking, stop any regular Alcohol  and or any Recreational drug use.  Wear Seat belts while driving.  Do not drive if taking any narcotic, mind altering or controlled substances or recreational drugs or alcohol.

## 2022-08-19 NOTE — Progress Notes (Addendum)
Progress Note  Patient Name: Calvin Carroll Date of Encounter: 08/19/2022  Primary Cardiologist: None  Subjective   No acute events overnight.  No symptoms.  Serum creatinine decreased from 2.2 yesterday to 1.7 this morning.  Inpatient Medications    Scheduled Meds:  apixaban  2.5 mg Oral BID   arformoterol  15 mcg Nebulization BID   atorvastatin  40 mg Oral QHS   azaTHIOprine  50 mg Oral Daily   budesonide (PULMICORT) nebulizer solution  0.5 mg Nebulization BID   Chlorhexidine Gluconate Cloth  6 each Topical Daily   cyanocobalamin  1,000 mcg Oral Daily   hydrALAZINE  50 mg Oral Q8H   hydrocortisone cream   Topical TID   metoprolol tartrate  50 mg Oral TID   pantoprazole  40 mg Oral QAC supper   polyethylene glycol  17 g Oral Daily   predniSONE  50 mg Oral Q breakfast   revefenacin  175 mcg Nebulization Daily   senna  2 tablet Oral Daily   tacrolimus  1.5 mg Oral Daily   And   tacrolimus  2 mg Oral QHS   Continuous Infusions:  diltiazem (CARDIZEM) infusion Stopped (08/17/22 1453)   PRN Meds: acetaminophen **OR** acetaminophen, guaiFENesin-dextromethorphan, hydrALAZINE, HYDROcodone bit-homatropine, hydrocortisone cream, hydrOXYzine, levalbuterol, ondansetron **OR** ondansetron (ZOFRAN) IV, mouth rinse, phenol   Vital Signs    Vitals:   08/19/22 0700 08/19/22 0753 08/19/22 0800 08/19/22 0805  BP: (!) 164/78  (!) 182/77   Pulse: (!) 56 (!) 57 (!) 56   Resp: 14 14 (!) 21   Temp:    98 F (36.7 C)  TempSrc:    Axillary  SpO2: 91% 94% 98%   Weight:      Height:        Intake/Output Summary (Last 24 hours) at 08/19/2022 0901 Last data filed at 08/19/2022 0410 Gross per 24 hour  Intake 926.67 ml  Output 700 ml  Net 226.67 ml   Filed Weights   08/17/22 1241 08/18/22 0500 08/19/22 0500  Weight: 49 kg 49 kg 48.9 kg    Telemetry     Personally reviewed, NSR  ECG    Not performed today  Physical Exam   GEN: No acute distress.   Neck: No  JVD. Cardiac: RRR, no murmur, rub, or gallop.  Respiratory: Nonlabored. Clear to auscultation bilaterally. GI: Soft, nontender, bowel sounds present. MS: No edema; No deformity. Neuro:  Nonfocal. Psych: Alert and oriented x 3. Normal affect.  Labs    Chemistry Recent Labs  Lab 08/18/22 0504 08/18/22 1514 08/19/22 0513  NA 131* 130* 132*  K 4.7 5.2* 4.8  CL 96* 97* 100  CO2 26 25 26   GLUCOSE 107* 149* 114*  BUN 90* 90* 79*  CREATININE 2.20* 2.05* 1.79*  CALCIUM 8.9 8.9 8.9  ALBUMIN  --   --  2.6*  GFRNONAA 32* 35* 42*  ANIONGAP 9 8 6      Hematology Recent Labs  Lab 08/17/22 0448 08/18/22 0504 08/19/22 0512  WBC 8.7 10.4 8.6  RBC 3.97* 4.15* 4.07*  HGB 12.6* 13.1 12.8*  HCT 36.7* 38.5* 38.0*  MCV 92.4 92.8 93.4  MCH 31.7 31.6 31.4  MCHC 34.3 34.0 33.7  RDW 13.6 13.4 13.6  PLT 236 224 206    Cardiac EnzymesNo results for input(s): "TROPONINIHS" in the last 720 hours.  BNP Recent Labs  Lab 08/12/22 1124 08/14/22 1500  BNP 1,240.0* 678.0*     DDimerNo results for input(s): "DDIMER" in the  last 168 hours.   Radiology    DG CHEST PORT 1 VIEW  Result Date: 08/19/2022 CLINICAL DATA:  Pulmonary edema EXAM: PORTABLE CHEST 1 VIEW COMPARISON:  Chest x-ray dated August 18, 2022 FINDINGS: Cardiac and mediastinal contours are unchanged. Unchanged diffuse interstitial opacities. Possible trace bilateral pleural effusions. No evidence of pneumothorax IMPRESSION: Unchanged pulmonary edema. Electronically Signed   By: Allegra Lai M.D.   On: 08/19/2022 08:07   US Renal Transplant w/Doppler  Result Date: 08/18/2022 CLINICAL DATA:  Acute kidney injury EXAM: ULTRASOUND OF RENAL TRANSPLANT WITH RENAL DOPPLER ULTRASOUND TECHNIQUE: Ultrasound examination of the renal transplant was performed with gray-scale, color and duplex doppler evaluation. COMPARISON:  None Available. FINDINGS: Transplant kidney location: Right lower quadrant Transplant Kidney: Renal measurements: 11.9  x 5.7 x 6.4 cm = volume: 226.27mL. Normal in size and parenchymal echogenicity. No evidence of mass or hydronephrosis. No peri-transplant fluid collection seen. Color flow in the main renal artery: Preserved waveform. Preserved arborization on color Doppler. Color flow in the main renal vein:  Yes Duplex Doppler Evaluation: Main Renal Artery Velocity: 44.9 cm/sec Main Renal Artery Resistive Index: 0.8 Venous waveform in main renal vein:  Present Intrarenal resistive index in upper pole:  0.82 (normal 0.6-0.8; equivocal 0.8-0.9; abnormal >= 0.9) Intrarenal resistive index in lower pole: 0.73 (normal 0.6-0.8; equivocal 0.8-0.9; abnormal >= 0.9) Bladder: Normal for degree of bladder distention. Other findings: Of note images were obtained of the native kidneys which are small and echogenic consistent with known previous correlate renal failure. There is a left-sided renal simple appearing cyst which is well-defined, thin wall than has through transmission. No specific follow-up. IMPRESSION: Right lower quadrant renal transplant. No collecting system dilatation or perinephric fluid. Preserved blood flow on Doppler. Of note please correlate with any prior examinations to assess for subtle change Electronically Signed   By: Karen Kays M.D.   On: 08/18/2022 15:35   DG CHEST PORT 1 VIEW  Result Date: 08/18/2022 CLINICAL DATA:  Shortness of breath. EXAM: PORTABLE CHEST 1 VIEW COMPARISON:  08/14/2022 and 08/12/2022 FINDINGS: Coarse lung markings with underlying emphysema. Negative for pneumothorax. Prominent lung markings have minimally changed since 08/14/2022 but more prominent compared to 08/12/2022. Heart size is normal and stable. Atherosclerotic calcifications at the aortic arch. Trachea is midline. IMPRESSION: 1. Chronic lung changes compatible with emphysema. 2. Concern for increased interstitial lung markings which could represent interstitial edema and/or atypical infection. Electronically Signed   By: Richarda Overlie M.D.   On: 08/18/2022 12:22    Cardiac Studies  Echocardiogram in 07/2022 LVEF normal RV systolic function is normal Trivial MR Mild aortic valve regurgitation Mild aortic valve stenosis Borderline dilatation of the aortic root, 37 mm Small PFO   Assessment & Plan   # New onset atrial flutter s/p DCCV on 08/17/2022 and history of atrial fibrillation for which she was scheduled for A-fib ablation in July 2024 with Dr. Lalla Brothers: Telemetry reviewed, NSR.  Continue metoprolol tartrate 50 mg twice daily, Eliquis 2.5 mg twice daily.  Not a candidate for amiodarone therapy due to drug interactions with tacrolimus. Will refer him to A-fib clinic for antiarrhythmic options.  # Acute on chronic HFpEF, compensated: IV diuresis held due to AKI.  No need of p.o. Lasix upon discharge.  # AKI versus AKI on CKD: Serum creatinine was 2.2 yesterday that dropped to 1.7 this morning after administering IV fluids.  Serum creatinine admission was 1.5 and serum creatinine in 04/2022 was 0.8.  I recommended him  to follow-up with kidney transplant specialist at [facility] soon after discharge.  He voiced understanding.  # HTN, poorly controlled: Lisinopril was held due to AKI.  HTN management per primary team.  # Mild AI and mild AS on 2024 echo as well as small PFO: Outpatient monitoring for valvular lesions. Small PFO with left to right shunting secondary to increased left atrial pressures from volume overload during the initial days of admission.   [facility] will sign off.   Medication Recommendations: Metoprolol tartrate 50 mg twice daily, Eliquis 2.5 mg twice daily. Other recommendations (labs, testing, etc): None Follow up as an outpatient: A-fib clinic for antiarrhythmic options, follow-up with EP in 10/2022 for A-fib ablation.   I have spent a total of 30 minutes with patient reviewing chart , telemetry, EKGs, labs and examining patient as well as establishing an assessment and plan that was  discussed with the patient.  > 50% of time was spent in direct patient care.     Signed, Marjo Bicker, MD  08/19/2022, 9:01 AM

## 2022-08-21 LAB — TACROLIMUS LEVEL: Tacrolimus (FK506) - LabCorp: 12.8 ng/mL (ref 2.0–20.0)

## 2022-08-22 ENCOUNTER — Ambulatory Visit (HOSPITAL_COMMUNITY)
Admission: RE | Admit: 2022-08-22 | Discharge: 2022-08-22 | Disposition: A | Discharge status: Home / Self Care | Payer: No Typology Code available for payment source | Source: Ambulatory Visit | Attending: Internal Medicine | Admitting: Internal Medicine

## 2022-08-22 VITALS — BP 138/70 | HR 71 | Ht 68.0 in | Wt 111.8 lb

## 2022-08-22 DIAGNOSIS — I2510 Atherosclerotic heart disease of native coronary artery without angina pectoris: Secondary | ICD-10-CM | POA: Insufficient documentation

## 2022-08-22 DIAGNOSIS — Z940 Kidney transplant status: Secondary | ICD-10-CM | POA: Insufficient documentation

## 2022-08-22 DIAGNOSIS — Z7901 Long term (current) use of anticoagulants: Secondary | ICD-10-CM | POA: Diagnosis not present

## 2022-08-22 DIAGNOSIS — J449 Chronic obstructive pulmonary disease, unspecified: Secondary | ICD-10-CM | POA: Diagnosis not present

## 2022-08-22 DIAGNOSIS — I4892 Unspecified atrial flutter: Secondary | ICD-10-CM | POA: Diagnosis not present

## 2022-08-22 DIAGNOSIS — I252 Old myocardial infarction: Secondary | ICD-10-CM | POA: Insufficient documentation

## 2022-08-22 DIAGNOSIS — I4891 Unspecified atrial fibrillation: Secondary | ICD-10-CM | POA: Diagnosis not present

## 2022-08-22 DIAGNOSIS — I10 Essential (primary) hypertension: Secondary | ICD-10-CM | POA: Insufficient documentation

## 2022-08-22 DIAGNOSIS — F17210 Nicotine dependence, cigarettes, uncomplicated: Secondary | ICD-10-CM | POA: Diagnosis not present

## 2022-08-22 DIAGNOSIS — I4819 Other persistent atrial fibrillation: Secondary | ICD-10-CM

## 2022-08-22 DIAGNOSIS — D6869 Other thrombophilia: Secondary | ICD-10-CM | POA: Insufficient documentation

## 2022-08-22 NOTE — Progress Notes (Signed)
Primary Care Physician: [facility] Primary Cardiologist: None Primary Electrophysiologist: Dr. Lalla Brothers Referring Physician:    [name]. is a 67 y.o. male with a history of renal transplant, CAD with history of MI, HLD, COPD, HTN, chronic diastolic heart failure, atrial flutter, and atrial fibrillation who presents for consultation in the [facility]. Diagnosed with Afib 07/2021. Seen by Dr. Lalla Brothers on 3/27 for more frequent episodes of Afib and is now scheduled for ablation on 11/07/22. Recent hospital admission 4/19-26 for 2 weeks of shortness of breath and coughing and 4 days prior to admission felt he was in Afib with RVR. He underwent successful DCCV on 4/24 and was discharged in SR. Patient is on Eliquis 2.5 mg BID for a CHADS2VASC score of 4.  On evaluation today, he is currently in SR. He has a device to check his HR and has not had any episodes of Afib since hospital discharge several days ago. He does still feel drained since hospital admission. He still has shortness of breath - it is similar to that at time of discharge.   He has not had any alcohol for the past 2 weeks.  He is compliant with anticoagulation and has not missed any doses. He has no bleeding concerns.  Today, he denies symptoms of palpitations, chest pain, orthopnea, PND, lower extremity edema, dizziness, presyncope, syncope, snoring, daytime somnolence, bleeding, or neurologic sequela. The patient is tolerating medications without difficulties and is otherwise without complaint today.   Atrial Fibrillation Risk Factors:  he does not have symptoms or diagnosis of sleep apnea. he is not compliant with CPAP therapy. he does not have a history of rheumatic fever. he does have a history of alcohol use. The patient does not have a history of early familial atrial fibrillation or other arrhythmias.  he has a BMI of Body mass index is 17 kg/m.[name] Kitchen Filed Weights    08/22/22 1355  Weight: 50.7 kg    No family history on file.   Atrial Fibrillation Management history:  Previous antiarrhythmic drugs: None Previous cardioversions: 08/17/22 Previous ablations: None Anticoagulation history: Eliquis 2.5 mg BID   Past Medical History:  Diagnosis Date   Atrial fibrillation (HCC)    COPD (chronic obstructive pulmonary disease) (HCC)    Hypertension    MI (myocardial infarction) (HCC)    Renal disorder    Past Surgical History:  Procedure Laterality Date   CARDIOVERSION N/A 08/17/2022   Procedure: CARDIOVERSION;  Surgeon: Marjo Bicker, MD;  Location: [facility];  Service: Cardiovascular;  Laterality: N/A;   KIDNEY TRANSPLANT     [facility]   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     apixaban (ELIQUIS) 2.5 MG TABS tablet Take 1 tablet (2.5 mg total) by mouth 2 (two) times daily. 60 tablet 1   azaTHIOprine (IMURAN) 50 MG tablet Take 1 tablet by mouth daily.     fexofenadine (ALLEGRA) 180 MG tablet Take 180 mg by mouth daily as needed for allergies.     flunisolide (NASALIDE) 25 MCG/ACT (0.025%) SOLN Place 2 sprays into the nose 2 (two) times daily.     guaiFENesin (MUCINEX PO) Take 2 tablets by mouth  every 4 (four) hours.     hydrALAZINE (APRESOLINE) 50 MG tablet Take 1 tablet (50 mg total) by mouth 3 (three) times daily. 90 tablet 1   metoprolol tartrate (LOPRESSOR) 50 MG tablet Take 1 tablet (50 mg total) by mouth 2 (two) times daily. 60 tablet 1   Mometasone Furoate 200 MCG/ACT AERO Inhale 2 each into the lungs in the morning and at bedtime.     pantoprazole (PROTONIX) 40 MG tablet Take 40 mg by mouth daily before supper.     predniSONE (DELTASONE) 10 MG tablet  Take 3 tabs daily with breakfast x 3 days, then 2 tabs QAM x 3 days, then 1 tab QAM x 3 days.  Then resume daily 5 mg tablet 18 tablet 0   [START ON 08/29/2022] predniSONE (DELTASONE) 5 MG tablet Take 1 tablet (5 mg total) by mouth every evening.     tacrolimus (PROGRAF) 0.5 MG capsule Take 0.5 mg by mouth See admin instructions. Take 0.5 mg daily in the morning with the 1 mg capsule to equal 1.5 mg in the morning     tacrolimus (PROGRAF) 1 MG capsule Take 1 mg by mouth See admin instructions. Take 1 mg in the morning (take with 0.5 mg to equal 1.5 mg in the morning) and 2 mg in the evening     Tiotropium Bromide-Olodaterol (STIOLTO RESPIMAT) 2.5-2.5 MCG/ACT AERS Inhale 2 each into the lungs daily.     torsemide (DEMADEX) 20 MG tablet Take 20 mg by mouth as needed (fluid).     No current facility-administered medications for this encounter.    No Known Allergies  Social History   Socioeconomic History   Marital status: Divorced    Spouse name: Not on file   Number of children: Not on file   Years of education: Not on file   Highest education level: Not on file  Occupational History   Not on file  Tobacco Use   Smoking status: Every Day    Packs/day: 1.5    Types: Cigarettes   Smokeless tobacco: Never  Substance and Sexual Activity   Alcohol use: Yes    Comment: couple of beers in the afternoon 3-4 days a week   Drug use: No   Sexual activity: Not on file  Other Topics Concern   Not on file  Social History Narrative   Not on file   Social Determinants of Health   Financial Resource Strain: Not on file  Food Insecurity: No Food Insecurity (08/12/2022)   Hunger Vital Sign    Worried About Running Out of Food in the Last Year: Never true    Ran Out of Food in the Last Year: Never true  Transportation Needs: No Transportation Needs (08/12/2022)   PRAPARE - Administrator, Civil Service (Medical): No    Lack of Transportation (Non-Medical): No  Physical Activity: Not  on file  Stress: Not on file  Social Connections: Not on file  Intimate Partner Violence: Not At Risk (08/12/2022)   Humiliation, Afraid, Rape, and Kick questionnaire    Fear of Current or Ex-Partner: No    Emotionally Abused: No    Physically Abused: No    Sexually Abused: No     ROS- All systems are reviewed and negative except as per the HPI above.  Physical Exam: Vitals:   08/22/22 1355  BP: 138/70  Pulse: 71  Weight: 50.7 kg  Height: 5\' 8"  (1.727 m)    GEN- The patient is a  well appearing male, alert and oriented x 3 today. Thin habitus. Head- normocephalic, atraumatic Eyes-  Sclera clear, conjunctiva pink Ears- hearing intact Oropharynx- clear Neck- supple  Lungs- Clear to ausculation bilaterally, normal work of breathing Heart- Regular rate and rhythm, no murmurs, rubs or gallops  GI- soft, NT, ND, + BS Extremities- no clubbing, cyanosis, or edema MS- no significant deformity or atrophy Skin- no rash or lesion Psych- euthymic mood, full affect Neuro- strength and sensation are intact  Wt Readings from Last 3 Encounters:  08/22/22 50.7 kg  08/19/22 48.9 kg  07/20/22 51.7 kg    EKG today demonstrates  Vent. rate 71 BPM PR interval 130 ms QRS duration 138 ms QT/QTcB 442/480 ms P-R-T axes -86 114 69 Unusual P axis and short PR, probable junctional rhythm with Premature atrial complexes Right bundle branch block Left posterior fascicular block  Bifascicular block  Abnormal ECG When compared with ECG of 15-Aug-2022 09:12, PREVIOUS ECG IS PRESENT  Echo 08/15/22 demonstrated: 1. Left ventricular ejection fraction, by estimation, is 60 to 65%. The  left ventricle has normal function. The left ventricle has no regional  wall motion abnormalities. Left ventricular diastolic function could not  be evaluated due to atrial arrythmia.   The E/e' is 26.9.   2. Right ventricular systolic function is normal. The right ventricular  size is normal. Tricuspid  regurgitation signal is inadequate for assessing  PA pressure.   3. Left atrial size was severely dilated.   4. Right atrial size was moderately dilated.   5. The mitral valve is abnormal. Trivial mitral valve regurgitation. No  evidence of mitral stenosis. Moderate to severe mitral annular  calcification.   6. The aortic valve was not well visualized. Visually, aortic valve  regurgitation is mild but PHT measures 364 msec likely from diastolic  dysfunction. Mild aortic valve stenosis. Aortic valve area, by VTI  measures 1.03 cm. Aortic valve mean gradient  measures 14.0 mmHg. Aortic valve Vmax measures 2.43 m/s.   7. Aortic dilatation noted. There is borderline dilatation of the aortic  root, measuring 37 mm.   8. The inferior vena cava is dilated in size with <50% respiratory  variability, suggesting right atrial pressure of 15 mmHg.   9. Evidence of atrial level shunting detected by color flow Doppler.  There is a small patent foramen ovale with predominantly left to right  shunting across the atrial septum.  Epic records are reviewed at length today.  CHA2DS2-VASc Score = 4  The patient's score is based upon: CHF History: 1 HTN History: 1 Diabetes History: 0 Stroke History: 0 Vascular Disease History: 1 Age Score: 1 Gender Score: 0       ASSESSMENT AND PLAN: Persistent Atrial Fibrillation (ICD10:  I48.19) / Atrial flutter The patient's CHA2DS2-VASc score is 4, indicating a 4.8% annual risk of stroke.    He is in SR today.  Education provided about Afib with visual diagram. He is scheduled for ablation in July and is s/p recent admission for COPD exacerbation with subsequent development of Afib. We discussed potential medication options to bridge him to ablation. He is not a candidate for Flecainide due to existing conduction disease. He is not a candidate for Multaq due to recent admission for COPD exacerbation, acute respiratory failure, and acute on chronic HFpEF. He is  not a candidate for amiodarone due to interaction with tacrolimus.   The only AAD we could consider is Tikosyn. Discussion of 3 night elective admission for this medication and  based on estimated CrCl 30 mL/min he would qualify for the lowest dose at 125 mcg BID. After discussion with patient, he would like to defer this treatment and wait for the ablation in July. This is reasonable and I am okay with this. If he notes frequent episodes of Afib then he will contact office and we may have to discuss with Dr. Lalla Brothers.    We will continue with conservative observation at this time.   2. Secondary Hypercoagulable State (ICD10:  D68.69) The patient is at significant risk for stroke/thromboembolism based upon his CHA2DS2-VASc Score of 4.  Continue Apixaban (Eliquis).   Taking lower dose Eliquis 2.5 mg BID due to creatinine and weight.   3. HTN Stable today, no changes made.    Follow up Afib clinic prn.    Lake Bells, PA-C Afib Clinic [facility] [address] [place], [place] [address] [phone] 08/22/2022 3:24 PM

## 2022-08-31 ENCOUNTER — Emergency Department (HOSPITAL_COMMUNITY): Payer: No Typology Code available for payment source

## 2022-08-31 ENCOUNTER — Encounter (HOSPITAL_COMMUNITY): Payer: Self-pay | Admitting: Emergency Medicine

## 2022-08-31 ENCOUNTER — Inpatient Hospital Stay (HOSPITAL_COMMUNITY)
Admission: EM | Admit: 2022-08-31 | Discharge: 2022-09-03 | DRG: 309 | Disposition: A | Discharge status: Home / Self Care | Payer: No Typology Code available for payment source | Attending: Internal Medicine | Admitting: Internal Medicine

## 2022-08-31 ENCOUNTER — Other Ambulatory Visit: Payer: Self-pay

## 2022-08-31 DIAGNOSIS — Z79624 Long term (current) use of inhibitors of nucleotide synthesis: Secondary | ICD-10-CM | POA: Diagnosis not present

## 2022-08-31 DIAGNOSIS — Z940 Kidney transplant status: Secondary | ICD-10-CM | POA: Diagnosis not present

## 2022-08-31 DIAGNOSIS — K219 Gastro-esophageal reflux disease without esophagitis: Secondary | ICD-10-CM | POA: Insufficient documentation

## 2022-08-31 DIAGNOSIS — I110 Hypertensive heart disease with heart failure: Secondary | ICD-10-CM | POA: Diagnosis present

## 2022-08-31 DIAGNOSIS — J441 Chronic obstructive pulmonary disease with (acute) exacerbation: Secondary | ICD-10-CM | POA: Diagnosis present

## 2022-08-31 DIAGNOSIS — I5032 Chronic diastolic (congestive) heart failure: Secondary | ICD-10-CM | POA: Diagnosis present

## 2022-08-31 DIAGNOSIS — J449 Chronic obstructive pulmonary disease, unspecified: Secondary | ICD-10-CM | POA: Diagnosis not present

## 2022-08-31 DIAGNOSIS — J9611 Chronic respiratory failure with hypoxia: Secondary | ICD-10-CM | POA: Insufficient documentation

## 2022-08-31 DIAGNOSIS — F17210 Nicotine dependence, cigarettes, uncomplicated: Secondary | ICD-10-CM | POA: Diagnosis present

## 2022-08-31 DIAGNOSIS — Z79899 Other long term (current) drug therapy: Secondary | ICD-10-CM

## 2022-08-31 DIAGNOSIS — I4891 Unspecified atrial fibrillation: Secondary | ICD-10-CM | POA: Diagnosis present

## 2022-08-31 DIAGNOSIS — Z79621 Long term (current) use of calcineurin inhibitor: Secondary | ICD-10-CM | POA: Diagnosis not present

## 2022-08-31 DIAGNOSIS — Z7952 Long term (current) use of systemic steroids: Secondary | ICD-10-CM | POA: Diagnosis not present

## 2022-08-31 DIAGNOSIS — Z888 Allergy status to other drugs, medicaments and biological substances status: Secondary | ICD-10-CM

## 2022-08-31 DIAGNOSIS — I482 Chronic atrial fibrillation, unspecified: Principal | ICD-10-CM | POA: Diagnosis present

## 2022-08-31 DIAGNOSIS — I252 Old myocardial infarction: Secondary | ICD-10-CM

## 2022-08-31 DIAGNOSIS — Z7901 Long term (current) use of anticoagulants: Secondary | ICD-10-CM

## 2022-08-31 DIAGNOSIS — J9601 Acute respiratory failure with hypoxia: Secondary | ICD-10-CM

## 2022-08-31 DIAGNOSIS — I2510 Atherosclerotic heart disease of native coronary artery without angina pectoris: Secondary | ICD-10-CM | POA: Diagnosis present

## 2022-08-31 DIAGNOSIS — I10 Essential (primary) hypertension: Secondary | ICD-10-CM | POA: Diagnosis not present

## 2022-08-31 DIAGNOSIS — I25119 Atherosclerotic heart disease of native coronary artery with unspecified angina pectoris: Secondary | ICD-10-CM | POA: Diagnosis not present

## 2022-08-31 LAB — TROPONIN I (HIGH SENSITIVITY)
Troponin I (High Sensitivity): 41 ng/L — ABNORMAL HIGH (ref <18)
Troponin I (High Sensitivity): 44 ng/L — ABNORMAL HIGH (ref <18)

## 2022-08-31 LAB — CBC WITH DIFFERENTIAL/PLATELET
Abs Immature Granulocytes: 0.03 10*3/uL (ref 0.00–0.07)
Basophils Absolute: 0 10*3/uL (ref 0.0–0.1)
Basophils Relative: 0 %
Eosinophils Absolute: 0.1 10*3/uL (ref 0.0–0.5)
Eosinophils Relative: 1 %
HCT: 37 % — ABNORMAL LOW (ref 39.0–52.0)
Hemoglobin: 12.2 g/dL — ABNORMAL LOW (ref 13.0–17.0)
Immature Granulocytes: 1 %
Lymphocytes Relative: 12 %
Lymphs Abs: 0.8 10*3/uL (ref 0.7–4.0)
MCH: 31.8 pg (ref 26.0–34.0)
MCHC: 33 g/dL (ref 30.0–36.0)
MCV: 96.4 fL (ref 80.0–100.0)
Monocytes Absolute: 0.4 10*3/uL (ref 0.1–1.0)
Monocytes Relative: 6 %
Neutro Abs: 5.4 10*3/uL (ref 1.7–7.7)
Neutrophils Relative %: 80 %
Platelets: 145 10*3/uL — ABNORMAL LOW (ref 150–400)
RBC: 3.84 MIL/uL — ABNORMAL LOW (ref 4.22–5.81)
RDW: 15.7 % — ABNORMAL HIGH (ref 11.5–15.5)
WBC: 6.6 10*3/uL (ref 4.0–10.5)
nRBC: 0 % (ref 0.0–0.2)

## 2022-08-31 LAB — PROTIME-INR
INR: 1.2 (ref 0.8–1.2)
Prothrombin Time: 15.4 seconds — ABNORMAL HIGH (ref 11.4–15.2)

## 2022-08-31 LAB — COMPREHENSIVE METABOLIC PANEL
ALT: 51 U/L — ABNORMAL HIGH (ref 0–44)
AST: 39 U/L (ref 15–41)
Albumin: 2.9 g/dL — ABNORMAL LOW (ref 3.5–5.0)
Alkaline Phosphatase: 64 U/L (ref 38–126)
Anion gap: 8 (ref 5–15)
BUN: 22 mg/dL (ref 8–23)
CO2: 27 mmol/L (ref 22–32)
Calcium: 8.5 mg/dL — ABNORMAL LOW (ref 8.9–10.3)
Chloride: 98 mmol/L (ref 98–111)
Creatinine, Ser: 0.88 mg/dL (ref 0.61–1.24)
GFR, Estimated: >60 mL/min (ref >60)
Glucose, Bld: 82 mg/dL (ref 70–99)
Potassium: 3.6 mmol/L (ref 3.5–5.1)
Sodium: 133 mmol/L — ABNORMAL LOW (ref 135–145)
Total Bilirubin: 1 mg/dL (ref 0.3–1.2)
Total Protein: 6.6 g/dL (ref 6.5–8.1)

## 2022-08-31 LAB — LACTIC ACID, PLASMA
Lactic Acid, Venous: 1.5 mmol/L (ref 0.5–1.9)
Lactic Acid, Venous: 1.7 mmol/L (ref 0.5–1.9)

## 2022-08-31 LAB — CULTURE, BLOOD (ROUTINE X 2): Special Requests: ADEQUATE

## 2022-08-31 MED ORDER — HYDRALAZINE HCL 25 MG PO TABS
50.0000 mg | ORAL_TABLET | Freq: Three times a day (TID) | ORAL | Status: DC
  Administered 2022-08-31 – 2022-09-01 (×2): 50 mg via ORAL
  Filled 2022-08-31 (×2): qty 2

## 2022-08-31 MED ORDER — METHYLPREDNISOLONE SODIUM SUCC 125 MG IJ SOLR
125.0000 mg | Freq: Once | INTRAMUSCULAR | Status: AC
  Administered 2022-08-31: 125 mg via INTRAVENOUS
  Filled 2022-08-31: qty 2

## 2022-08-31 MED ORDER — MORPHINE SULFATE (PF) 2 MG/ML IV SOLN
2.0000 mg | INTRAVENOUS | Status: DC | PRN
  Administered 2022-09-03: 2 mg via INTRAVENOUS
  Filled 2022-08-31: qty 1

## 2022-08-31 MED ORDER — DILTIAZEM LOAD VIA INFUSION
20.0000 mg | Freq: Once | INTRAVENOUS | Status: AC
  Administered 2022-08-31: 20 mg via INTRAVENOUS
  Filled 2022-08-31: qty 20

## 2022-08-31 MED ORDER — BUDESONIDE 0.25 MG/2ML IN SUSP
0.2500 mg | Freq: Two times a day (BID) | RESPIRATORY_TRACT | Status: DC
  Filled 2022-08-31: qty 2

## 2022-08-31 MED ORDER — ONDANSETRON HCL 4 MG/2ML IJ SOLN
4.0000 mg | Freq: Four times a day (QID) | INTRAMUSCULAR | Status: DC | PRN
  Administered 2022-09-03: 4 mg via INTRAVENOUS
  Filled 2022-08-31: qty 2

## 2022-08-31 MED ORDER — ACETAMINOPHEN 650 MG RE SUPP: 650.0000 mg | Freq: Four times a day (QID) | RECTAL | Status: DC | PRN

## 2022-08-31 MED ORDER — ENSURE ENLIVE PO LIQD
237.0000 mL | Freq: Two times a day (BID) | ORAL | Status: DC
  Administered 2022-09-01 – 2022-09-03 (×4): 237 mL via ORAL

## 2022-08-31 MED ORDER — LEVALBUTEROL HCL 0.63 MG/3ML IN NEBU: 0.6300 mg | INHALATION_SOLUTION | Freq: Four times a day (QID) | RESPIRATORY_TRACT | Status: DC | PRN

## 2022-08-31 MED ORDER — IOHEXOL 350 MG/ML SOLN
75.0000 mL | Freq: Once | INTRAVENOUS | Status: AC | PRN
  Administered 2022-08-31: 75 mL via INTRAVENOUS

## 2022-08-31 MED ORDER — ACETAMINOPHEN 325 MG PO TABS
650.0000 mg | ORAL_TABLET | Freq: Four times a day (QID) | ORAL | Status: DC | PRN
  Administered 2022-09-03: 650 mg via ORAL
  Filled 2022-08-31: qty 2

## 2022-08-31 MED ORDER — IPRATROPIUM-ALBUTEROL 0.5-2.5 (3) MG/3ML IN SOLN
3.0000 mL | Freq: Once | RESPIRATORY_TRACT | Status: AC
  Administered 2022-08-31: 3 mL via RESPIRATORY_TRACT
  Filled 2022-08-31: qty 3

## 2022-08-31 MED ORDER — MOMETASONE FUROATE 200 MCG/ACT IN AERO: 2.0000 | INHALATION_SPRAY | Freq: Two times a day (BID) | RESPIRATORY_TRACT | Status: DC

## 2022-08-31 MED ORDER — PANTOPRAZOLE SODIUM 40 MG PO TBEC: 40.0000 mg | DELAYED_RELEASE_TABLET | Freq: Every day | ORAL | Status: DC

## 2022-08-31 MED ORDER — AZATHIOPRINE 50 MG PO TABS
50.0000 mg | ORAL_TABLET | Freq: Every day | ORAL | Status: DC
  Administered 2022-09-01 – 2022-09-03 (×3): 50 mg via ORAL
  Filled 2022-08-31 (×5): qty 1

## 2022-08-31 MED ORDER — TACROLIMUS 0.5 MG PO CAPS: 0.5000 mg | ORAL_CAPSULE | ORAL | Status: DC

## 2022-08-31 MED ORDER — APIXABAN 2.5 MG PO TABS
2.5000 mg | ORAL_TABLET | Freq: Two times a day (BID) | ORAL | Status: DC
  Administered 2022-08-31 – 2022-09-01 (×2): 2.5 mg via ORAL
  Filled 2022-08-31 (×2): qty 1

## 2022-08-31 MED ORDER — TACROLIMUS 1 MG PO CAPS
2.0000 mg | ORAL_CAPSULE | Freq: Every day | ORAL | Status: DC
  Administered 2022-09-01 – 2022-09-02 (×3): 2 mg via ORAL
  Filled 2022-08-31 (×5): qty 2

## 2022-08-31 MED ORDER — METOPROLOL TARTRATE 50 MG PO TABS
50.0000 mg | ORAL_TABLET | Freq: Two times a day (BID) | ORAL | Status: DC
  Administered 2022-08-31 – 2022-09-03 (×6): 50 mg via ORAL
  Filled 2022-08-31 (×6): qty 1

## 2022-08-31 MED ORDER — ATORVASTATIN CALCIUM 40 MG PO TABS: 40.0000 mg | ORAL_TABLET | Freq: Every day | ORAL | Status: DC

## 2022-08-31 MED ORDER — CHLORHEXIDINE GLUCONATE CLOTH 2 % EX PADS
6.0000 | MEDICATED_PAD | Freq: Every day | CUTANEOUS | Status: DC
  Administered 2022-08-31 – 2022-09-03 (×3): 6 via TOPICAL

## 2022-08-31 MED ORDER — DILTIAZEM HCL-DEXTROSE 125-5 MG/125ML-% IV SOLN (PREMIX)
5.0000 mg/h | INTRAVENOUS | Status: DC
  Administered 2022-08-31: 5 mg/h via INTRAVENOUS
  Administered 2022-09-01: 15 mg/h via INTRAVENOUS
  Filled 2022-08-31 (×2): qty 125

## 2022-08-31 MED ORDER — ONDANSETRON HCL 4 MG PO TABS: 4.0000 mg | ORAL_TABLET | Freq: Four times a day (QID) | ORAL | Status: DC | PRN

## 2022-08-31 MED ORDER — TACROLIMUS 1 MG PO CAPS: 1.0000 mg | ORAL_CAPSULE | ORAL | Status: DC

## 2022-08-31 MED ORDER — UMECLIDINIUM BROMIDE 62.5 MCG/ACT IN AEPB
1.0000 | INHALATION_SPRAY | Freq: Every day | RESPIRATORY_TRACT | Status: DC
  Administered 2022-09-01 – 2022-09-03 (×3): 1 via RESPIRATORY_TRACT
  Filled 2022-08-31: qty 7

## 2022-08-31 MED ORDER — OXYCODONE HCL 5 MG PO TABS: 5.0000 mg | ORAL_TABLET | ORAL | Status: DC | PRN

## 2022-08-31 MED ORDER — ARFORMOTEROL TARTRATE 15 MCG/2ML IN NEBU
15.0000 ug | INHALATION_SOLUTION | Freq: Two times a day (BID) | RESPIRATORY_TRACT | Status: DC
  Administered 2022-09-01 – 2022-09-03 (×5): 15 ug via RESPIRATORY_TRACT
  Filled 2022-08-31 (×5): qty 2

## 2022-08-31 MED ORDER — TACROLIMUS 1 MG PO CAPS
1.5000 mg | ORAL_CAPSULE | Freq: Every day | ORAL | Status: DC
  Administered 2022-09-01 – 2022-09-03 (×3): 1.5 mg via ORAL
  Filled 2022-08-31 (×5): qty 1

## 2022-08-31 NOTE — ED Provider Triage Note (Signed)
Emergency Medicine Provider Triage Evaluation Note  Calvin Carroll , a 67 y.o. male  was evaluated in triage.  Pt complains of shortness of breath.  Started a week and a half ago.  Patient recently admitted for acute respiratory failure secondary to COPD exacerbation.  Discharged on April 26.  Sent home with oxygen.  States he has not been able to refill his oxygen at home.  States he still has a cough but no fever at home.  Shortness of breath is worse with exertion.  Denies chest pain at this time.  Has known history of A-fib.  Review of Systems  Positive: See above Negative: See above  Physical Exam  BP (!) 140/86 (BP Location: Right Arm)   Pulse (!) 111   Temp 98.4 F (36.9 C)   Resp (!) 23   SpO2 91%  Gen:   Awake, no distress   Resp:  Normal effort, on [place] MSK:   Moves extremities without difficulty  Other:  Work up started  Medical Decision Making  Medically screening exam initiated at 4:41 PM.  Appropriate orders placed.  Calvin Carroll. was informed that the remainder of the evaluation will be completed by another provider, this initial triage assessment does not replace that evaluation, and the importance of remaining in the ED until their evaluation is complete.  Work up started   Gareth Eagle, PA-C 08/31/22 1643

## 2022-08-31 NOTE — ED Notes (Signed)
Pt placed on 02 2l [place]

## 2022-08-31 NOTE — ED Notes (Signed)
ICU unable to take report at present.  They will call back

## 2022-08-31 NOTE — ED Notes (Signed)
Urinal at bedside.  

## 2022-08-31 NOTE — ED Notes (Signed)
Patient denies pain and is resting comfortably.  

## 2022-08-31 NOTE — ED Provider Notes (Signed)
[place] EMERGENCY DEPARTMENT AT [facility] Provider Note   CSN: [id number] Arrival date & time: 08/31/22  1433     History  Chief Complaint  Patient presents with   Shortness of Breath    Calvin Carroll. is a 67 y.o. male.  Pt is a 67 yo male with pmhx significant for COPD, HTN, CAD, s/p kidney transplant, and afib on Eliquis.   Pt was admitted from 4/19-4/26 for COPD and pna.  He was cardioverted on 4/24.  He did follow with the afib clinic on 4/29 and he was in NSR at the time.  Pt was supposed to go home with oxygen, but somehow, it did not get scheduled.  The pt has been sob since d/c.  He has also been in afib with hr up to the 150s.  He is scheduled for an ablation in July.  Pt said he's been taking his metoprolol 3 times a day to try to get his HR down, but it is staying high.          Home Medications Prior to Admission medications   Medication Sig Start Date End Date Taking? Authorizing Provider  apixaban (ELIQUIS) 2.5 MG TABS tablet Take 1 tablet (2.5 mg total) by mouth 2 (two) times daily. 08/19/22  Yes Johnson, Clanford L, MD  azaTHIOprine (IMURAN) 50 MG tablet Take 1 tablet by mouth daily. 05/11/22  Yes [provider]  fexofenadine (ALLEGRA) 180 MG tablet Take 180 mg by mouth daily as needed for allergies. 08/05/04  Yes [provider]  flunisolide (NASALIDE) 25 MCG/ACT (0.025%) SOLN Place 2 sprays into the nose 2 (two) times daily.   Yes [provider]  guaiFENesin (MUCINEX PO) Take 2 tablets by mouth every 12 (twelve) hours.   Yes [provider]  hydrALAZINE (APRESOLINE) 50 MG tablet Take 1 tablet (50 mg total) by mouth 3 (three) times daily. 08/19/22  Yes Johnson, Clanford L, MD  metoprolol tartrate (LOPRESSOR) 50 MG tablet Take 1 tablet (50 mg total) by mouth 2 (two) times daily. Patient taking differently: Take 50 mg by mouth 2 (two) times daily. Pt has been taking 3 times daily 08/19/22  Yes Johnson, Clanford L,  MD  Mometasone Furoate 200 MCG/ACT AERO Inhale 2 each into the lungs in the morning and at bedtime. 03/08/22  Yes [provider]  pantoprazole (PROTONIX) 40 MG tablet Take 40 mg by mouth daily before supper.   Yes [provider]  predniSONE (DELTASONE) 5 MG tablet Take 1 tablet (5 mg total) by mouth every evening. 08/29/22  Yes Johnson, Clanford L, MD  tacrolimus (PROGRAF) 0.5 MG capsule Take 0.5 mg by mouth See admin instructions. Take 0.5 mg daily in the morning with the 1 mg capsule to equal 1.5 mg in the morning   Yes [provider]  tacrolimus (PROGRAF) 1 MG capsule Take 1 mg by mouth See admin instructions. Take 1 mg in the morning (take with 0.5 mg to equal 1.5 mg in the morning) and 2 mg in the evening   Yes [provider]  Tiotropium Bromide-Olodaterol (STIOLTO RESPIMAT) 2.5-2.5 MCG/ACT AERS Inhale 2 each into the lungs daily.   Yes [provider]  torsemide (DEMADEX) 20 MG tablet Take 20 mg by mouth as needed (fluid). 11/30/21  Yes [provider]  albuterol (PROVENTIL HFA;VENTOLIN HFA) 108 (90 Base) MCG/ACT inhaler Inhale 1-2 puffs into the lungs every 4 (four) hours as needed for wheezing or shortness of breath.  [provider]  apixaban (ELIQUIS) 5 MG TABS tablet TAKE ONE TABLET BY MOUTH TWICE A DAY (CAUTION: BLOOD THINNER) Patient not taking: Reported on 08/31/2022 05/19/22 08/31/22  [provider]  atorvastatin (LIPITOR) 40 MG tablet TAKE ONE TABLET BY MOUTH AT BEDTIME FOR CHOLESTEROL Patient not taking: Reported on 08/31/2022 05/19/22 08/31/22  [provider]  benzonatate (TESSALON) 100 MG capsule TAKE ONE CAPSULE BY MOUTH EVERY 8 HOURS AS NEEDED FOR COUGH Patient not taking: Reported on 08/31/2022 06/30/22 08/31/22  [provider]  ipratropium-albuterol (DUONEB) 0.5-2.5 (3) MG/3ML SOLN INHALE 1 VIAL IN NEBULIZER BY MOUTH FOUR TIMES A DAY AS NEEDED FOR COPD Patient not taking: Reported on 08/31/2022 07/13/21  08/31/22  [provider]  predniSONE (DELTASONE) 10 MG tablet TAKE THREE TABLETS BY MOUTH EVERY MORNING WITH FOOD FOR 3 DAYS, THEN TAKE TWO TABLETS EVERY MORNING FOR 3 DAYS, THEN TAKE ONE TABLET EVERY MORNING FOR 3 DAYS THEN RESUME 5MG  TABLET (TAKE WITH FOOD) Patient not taking: Reported on 08/31/2022 08/19/22   [provider]      Allergies    Atorvastatin    Review of Systems   Review of Systems  Respiratory:  Positive for cough and shortness of breath.   All other systems reviewed and are negative.   Physical Exam Updated Vital Signs BP 113/72   Pulse (!) 57   Temp 98.4 F (36.9 C)   Resp (!) 29   SpO2 94%  Physical Exam Vitals and nursing note reviewed.  Constitutional:      Appearance: He is well-developed.  HENT:     Head: Normocephalic and atraumatic.     Mouth/Throat:     Mouth: Mucous membranes are moist.     Pharynx: Oropharynx is clear.  Eyes:     Extraocular Movements: Extraocular movements intact.     Pupils: Pupils are equal, round, and reactive to light.  Cardiovascular:     Rate and Rhythm: Tachycardia present. Rhythm irregular.     Comments: HR going up to the 150s in room Pulmonary:     Breath sounds: Wheezing present.  Abdominal:     General: Bowel sounds are normal.     Palpations: Abdomen is soft.  Musculoskeletal:        General: Normal range of motion.     Cervical back: Normal range of motion and neck supple.  Skin:    General: Skin is warm.     Capillary Refill: Capillary refill takes less than 2 seconds.  Neurological:     General: No focal deficit present.     Mental Status: He is alert and oriented to person, place, and time.  Psychiatric:        Mood and Affect: Mood normal.        Behavior: Behavior normal.     ED Results / Procedures / Treatments   Labs (all labs ordered are listed, but only abnormal results are displayed) Labs Reviewed  CBC WITH DIFFERENTIAL/PLATELET - Abnormal; Notable for the following  components:      Result Value   RBC 3.84 (*)    Hemoglobin 12.2 (*)    HCT 37.0 (*)    RDW 15.7 (*)    Platelets 145 (*)    All other components within normal limits  COMPREHENSIVE METABOLIC PANEL - Abnormal; Notable for the following components:   Sodium 133 (*)    Calcium 8.5 (*)    Albumin 2.9 (*)    ALT 51 (*)    All  other components within normal limits  PROTIME-INR - Abnormal; Notable for the following components:   Prothrombin Time 15.4 (*)    All other components within normal limits  TROPONIN I (HIGH SENSITIVITY) - Abnormal; Notable for the following components:   Troponin I (High Sensitivity) 41 (*)    All other components within normal limits  TROPONIN I (HIGH SENSITIVITY) - Abnormal; Notable for the following components:   Troponin I (High Sensitivity) 44 (*)    All other components within normal limits  CULTURE, BLOOD (ROUTINE X 2)  CULTURE, BLOOD (ROUTINE X 2)  LACTIC ACID, PLASMA  LACTIC ACID, PLASMA  URINALYSIS, W/ REFLEX TO CULTURE (INFECTION SUSPECTED)    EKG EKG Interpretation  Date/Time:  Wednesday Aug 31 2022 20:30:02 EDT Ventricular Rate:  111 PR Interval:    QRS Duration: 146 QT Interval:  426 QTC Calculation: 579 R Axis:   108 Text Interpretation: Atrial flutter with predominant 3:1 AV block Ventricular premature complex RBBB and LPFB No significant change since last tracing Confirmed by Jacalyn Lefevre [phone]) on 08/31/2022 8:42:36 PM  Radiology CT Angio Chest PE W/Cm &/Or Wo Cm  Addendum Date: 08/31/2022   ADDENDUM REPORT: 08/31/2022 20:03 ADDENDUM: Atrophic native kidneys partially imaged. Electronically Signed   By: Jasmine Pang M.D.   On: 08/31/2022 20:03   Result Date: 08/31/2022 CLINICAL DATA:  Respiratory failure EXAM: CT ANGIOGRAPHY CHEST WITH CONTRAST TECHNIQUE: Multidetector CT imaging of the chest was performed using the standard protocol during bolus administration of intravenous contrast. Multiplanar CT image reconstructions and MIPs  were obtained to evaluate the vascular anatomy. RADIATION DOSE REDUCTION: This exam was performed according to the departmental dose-optimization program which includes automated exposure control, adjustment of the mA and/or kV according to patient size and/or use of iterative reconstruction technique. CONTRAST:  75mL OMNIPAQUE IOHEXOL 350 MG/ML SOLN COMPARISON:  Chest x-ray 08/31/2022, chest CT 08/12/2022 FINDINGS: Cardiovascular: Satisfactory opacification of the pulmonary arteries to the segmental level. No evidence of pulmonary embolism. Advanced aortic atherosclerosis. No aneurysm. Coronary vascular calcification. Normal cardiac size. No pericardial effusion. Mitral calcification. 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 Mediastinum/Nodes: Midline trachea. No thyroid mass. Mildly enlarged lymph nodes. Right paratracheal nodes measure up to 8 mm. Precarinal lymph node measuring up to 14 mm. Calcified subcarinal nodes consistent with prior granulomatous disease. Right hilar nodes measuring up to 14 mm. Esophagus within normal limits. Lungs/Pleura: Advanced emphysema. No pleural effusion or pneumothorax. Calcified granulomas. Curvilinear densities in the lingula and right lower lobe are stable since January 2023 and presumably represent chronic foci of scarring. Previously noted right middle lobe reticular densities are improved. No new confluent consolidation is seen. Upper Abdomen: Atrophic kidneys.  No acute finding Musculoskeletal: No acute osseous abnormality. Review of the MIP images confirms the above findings. IMPRESSION: 1. Negative for acute pulmonary embolus. 2. Advanced emphysema. Previously noted right middle lobe reticular densities are improved. No new confluent airspace disease. 3. Evidence of prior granulomatous disease. Probable scarring  in the lingula and right lower lobe. 4. 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 5. Aortic atherosclerosis. Aortic Atherosclerosis (ICD10-I70.0) and Emphysema (ICD10-J43.9). Electronically Signed: By: Jasmine Pang M.D. On: 08/31/2022 18:35   DG Chest 2 View  Result Date: 08/31/2022 CLINICAL DATA:  Shortness of breath EXAM: CHEST - 2 VIEW COMPARISON:  Chest radiograph dated 08/19/2022 FINDINGS: Hyperinflated lungs. Similar coarse interstitial opacities with slightly increased hazy lateral left mid lung and patchy left lingular opacities. No pleural effusion or pneumothorax. The heart size and mediastinal contours are within normal limits. Aortic atherosclerosis. No acute osseous abnormality. IMPRESSION: Slightly increased hazy lateral left mid lung and patchy left lingular opacities, which could represent atelectasis or pneumonia superimposed on emphysematous changes. Electronically Signed   By: Agustin Cree M.D.   On: 08/31/2022 16:30    Procedures Procedures    Medications Ordered in ED Medications  diltiazem (CARDIZEM) 1 mg/mL load via infusion 20 mg (20 mg Intravenous Bolus from Bag 08/31/22 2036)    And  diltiazem (CARDIZEM) 125 mg in dextrose 5% 125 mL (1 mg/mL) infusion (5 mg/hr Intravenous New Bag/Given 08/31/22 2036)  iohexol (OMNIPAQUE) 350 MG/ML injection 75 mL (75 mLs Intravenous Contrast Given 08/31/22 1804)  methylPREDNISolone sodium succinate (SOLU-MEDROL) 125 mg/2 mL injection 125 mg (125 mg Intravenous Given 08/31/22 2033)  ipratropium-albuterol (DUONEB) 0.5-2.5 (3) MG/3ML nebulizer solution 3 mL (3 mLs Nebulization Given 08/31/22 2015)    ED Course/ Medical Decision Making/ A&P                             Medical Decision Making Amount and/or Complexity of Data Reviewed Labs: ordered. Radiology: ordered.  Risk Prescription drug management. Decision regarding hospitalization.   This patient presents  to the ED for concern of sob, this involves an extensive number of treatment options, and is a complaint that carries with it a high risk of complications and morbidity.  The differential diagnosis includes copd exac, pna, covid, afib with rvr   Co morbidities that complicate the patient evaluation  COPD, HTN, CAD, s/p kidney transplant, and afib on Eliquis   Additional history obtained:  Additional history obtained from epic chart review  Lab Tests:  I Ordered, and personally interpreted labs.  The pertinent results include:  cbc with mild anemia (hgb 12.2 which is chronic), trop stable at 41 and 44, cmp nl, inr 1.2   Imaging Studies ordered:  I ordered imaging studies including cxr and ct chest  I independently visualized and interpreted imaging which showed  CXR: Slightly increased hazy lateral left mid lung and patchy left  lingular opacities, which could represent atelectasis or pneumonia  superimposed on emphysematous changes.  CT chest: . Negative for acute pulmonary embolus.  2. Advanced emphysema. Previously noted right middle lobe reticular  densities are improved. No new confluent airspace disease.  3. Evidence of prior granulomatous disease. Probable scarring in the  lingula and right lower lobe.  4. 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  5. Aortic atherosclerosis.    Aortic Atherosclerosis (ICD10-I70.0) and Emphysema (ICD10-J43.9).   I agree with the radiologist interpretation   Cardiac Monitoring:  The patient was maintained on a cardiac monitor.  I personally viewed and interpreted the cardiac monitored which showed an underlying rhythm of: afib with rvr   Medicines ordered and prescription drug management:  I ordered medication including cardizem  for afib; duoneb/solumedrol for copd  Reevaluation of the patient after these medicines showed that the patient improved I  have reviewed the patients home medicines and have made adjustments as needed   Test Considered:  ct   Critical Interventions:  oxygen   Consultations Obtained:  I requested consultation with the hospitalist (Dr. Carren Rang),  and discussed  lab and imaging findings as well as pertinent plan -she will admit   Problem List / ED Course:  COPD exac:  pt is feeling better with oxygen.  He has qualified for oxygen, but somehow, the order did not get followed up. Afib with rvr:  on cardizem   Reevaluation:  After the interventions noted above, I reevaluated the patient and found that they have :improved   Social Determinants of Health:  Lives at home   Dispostion:  After consideration of the diagnostic results and the patients response to treatment, I feel that the patent would benefit from admission.    CRITICAL CARE Performed by: Jacalyn Lefevre   Total critical care time: 30 minutes  Critical care time was exclusive of separately billable procedures and treating other patients.  Critical care was necessary to treat or prevent imminent or life-threatening deterioration.  Critical care was time spent personally by me on the following activities: development of treatment plan with patient and/or surrogate as well as nursing, discussions with consultants, evaluation of patient's response to treatment, examination of patient, obtaining history from patient or surrogate, ordering and performing treatments and interventions, ordering and review of laboratory studies, ordering and review of radiographic studies, pulse oximetry and re-evaluation of patient's condition.         Final Clinical Impression(s) / ED Diagnoses Final diagnoses:  COPD exacerbation (HCC)  Atrial fibrillation with RVR (HCC)  Acute respiratory failure with hypoxia [facility])    Rx / DC Orders ED Discharge Orders     None         Jacalyn Lefevre, MD 08/31/22 2114

## 2022-08-31 NOTE — ED Triage Notes (Signed)
Pt c/o sob. Was told he has pna. Pt ambulatory with steady gait. No resp distress noted. Mild labored breathing noted. Pt c/o chronic pain to joints.

## 2022-09-01 ENCOUNTER — Other Ambulatory Visit: Payer: Self-pay

## 2022-09-01 DIAGNOSIS — J441 Chronic obstructive pulmonary disease with (acute) exacerbation: Secondary | ICD-10-CM

## 2022-09-01 DIAGNOSIS — I10 Essential (primary) hypertension: Secondary | ICD-10-CM

## 2022-09-01 DIAGNOSIS — I4891 Unspecified atrial fibrillation: Secondary | ICD-10-CM

## 2022-09-01 DIAGNOSIS — I2510 Atherosclerotic heart disease of native coronary artery without angina pectoris: Secondary | ICD-10-CM | POA: Insufficient documentation

## 2022-09-01 DIAGNOSIS — J9611 Chronic respiratory failure with hypoxia: Secondary | ICD-10-CM | POA: Diagnosis not present

## 2022-09-01 DIAGNOSIS — I25119 Atherosclerotic heart disease of native coronary artery with unspecified angina pectoris: Secondary | ICD-10-CM | POA: Diagnosis not present

## 2022-09-01 DIAGNOSIS — J449 Chronic obstructive pulmonary disease, unspecified: Secondary | ICD-10-CM

## 2022-09-01 DIAGNOSIS — K219 Gastro-esophageal reflux disease without esophagitis: Secondary | ICD-10-CM | POA: Insufficient documentation

## 2022-09-01 LAB — CBC WITH DIFFERENTIAL/PLATELET
Abs Immature Granulocytes: 0.01 10*3/uL (ref 0.00–0.07)
Basophils Absolute: 0 10*3/uL (ref 0.0–0.1)
Basophils Relative: 0 %
Eosinophils Absolute: 0 10*3/uL (ref 0.0–0.5)
Eosinophils Relative: 0 %
HCT: 33 % — ABNORMAL LOW (ref 39.0–52.0)
Hemoglobin: 11 g/dL — ABNORMAL LOW (ref 13.0–17.0)
Immature Granulocytes: 0 %
Lymphocytes Relative: 8 %
Lymphs Abs: 0.3 10*3/uL — ABNORMAL LOW (ref 0.7–4.0)
MCH: 31.5 pg (ref 26.0–34.0)
MCHC: 33.3 g/dL (ref 30.0–36.0)
MCV: 94.6 fL (ref 80.0–100.0)
Monocytes Absolute: 0.1 10*3/uL (ref 0.1–1.0)
Monocytes Relative: 2 %
Neutro Abs: 2.7 10*3/uL (ref 1.7–7.7)
Neutrophils Relative %: 90 %
Platelets: 120 10*3/uL — ABNORMAL LOW (ref 150–400)
RBC: 3.49 MIL/uL — ABNORMAL LOW (ref 4.22–5.81)
RDW: 15.5 % (ref 11.5–15.5)
WBC: 3.1 10*3/uL — ABNORMAL LOW (ref 4.0–10.5)
nRBC: 0 % (ref 0.0–0.2)

## 2022-09-01 LAB — COMPREHENSIVE METABOLIC PANEL
ALT: 42 U/L (ref 0–44)
AST: 35 U/L (ref 15–41)
Albumin: 2.6 g/dL — ABNORMAL LOW (ref 3.5–5.0)
Alkaline Phosphatase: 57 U/L (ref 38–126)
Anion gap: 6 (ref 5–15)
BUN: 22 mg/dL (ref 8–23)
CO2: 26 mmol/L (ref 22–32)
Calcium: 8.4 mg/dL — ABNORMAL LOW (ref 8.9–10.3)
Chloride: 101 mmol/L (ref 98–111)
Creatinine, Ser: 0.78 mg/dL (ref 0.61–1.24)
GFR, Estimated: >60 mL/min (ref >60)
Glucose, Bld: 194 mg/dL — ABNORMAL HIGH (ref 70–99)
Potassium: 3.9 mmol/L (ref 3.5–5.1)
Sodium: 133 mmol/L — ABNORMAL LOW (ref 135–145)
Total Bilirubin: 0.7 mg/dL (ref 0.3–1.2)
Total Protein: 6.2 g/dL — ABNORMAL LOW (ref 6.5–8.1)

## 2022-09-01 LAB — CULTURE, BLOOD (ROUTINE X 2): Culture: NO GROWTH

## 2022-09-01 LAB — MAGNESIUM: Magnesium: 1.8 mg/dL (ref 1.7–2.4)

## 2022-09-01 MED ORDER — HYDRALAZINE HCL 25 MG PO TABS
50.0000 mg | ORAL_TABLET | Freq: Two times a day (BID) | ORAL | Status: DC
  Administered 2022-09-01 – 2022-09-03 (×4): 50 mg via ORAL
  Filled 2022-09-01 (×4): qty 2

## 2022-09-01 MED ORDER — DILTIAZEM HCL ER 60 MG PO CP12
60.0000 mg | ORAL_CAPSULE | Freq: Two times a day (BID) | ORAL | Status: DC
  Administered 2022-09-01: 60 mg via ORAL
  Filled 2022-09-01 (×2): qty 1

## 2022-09-01 MED ORDER — APIXABAN 5 MG PO TABS
5.0000 mg | ORAL_TABLET | Freq: Two times a day (BID) | ORAL | Status: DC
  Administered 2022-09-01 – 2022-09-03 (×4): 5 mg via ORAL
  Filled 2022-09-01 (×4): qty 1

## 2022-09-01 MED ORDER — DILTIAZEM HCL 25 MG/5ML IV SOLN
INTRAVENOUS | Status: AC
  Filled 2022-09-01: qty 5

## 2022-09-01 MED ORDER — PANTOPRAZOLE SODIUM 40 MG PO TBEC
40.0000 mg | DELAYED_RELEASE_TABLET | Freq: Two times a day (BID) | ORAL | Status: DC
  Administered 2022-09-01 – 2022-09-03 (×5): 40 mg via ORAL
  Filled 2022-09-01 (×5): qty 1

## 2022-09-01 MED ORDER — BUDESONIDE 0.5 MG/2ML IN SUSP
0.5000 mg | Freq: Two times a day (BID) | RESPIRATORY_TRACT | Status: DC
  Administered 2022-09-01 – 2022-09-03 (×4): 0.5 mg via RESPIRATORY_TRACT
  Filled 2022-09-01 (×5): qty 2

## 2022-09-01 MED ORDER — PREDNISONE 20 MG PO TABS
40.0000 mg | ORAL_TABLET | Freq: Every day | ORAL | Status: DC
  Administered 2022-09-01 – 2022-09-03 (×3): 40 mg via ORAL
  Filled 2022-09-01 (×3): qty 2

## 2022-09-01 MED ORDER — DILTIAZEM HCL-DEXTROSE 125-5 MG/125ML-% IV SOLN (PREMIX)
5.0000 mg/h | INTRAVENOUS | Status: DC
  Administered 2022-09-01: 5 mg/h via INTRAVENOUS
  Filled 2022-09-01: qty 125

## 2022-09-01 MED ORDER — DILTIAZEM HCL 25 MG/5ML IV SOLN
15.0000 mg | Freq: Once | INTRAVENOUS | Status: AC
  Administered 2022-09-01: 15 mg via INTRAVENOUS

## 2022-09-01 MED ORDER — DM-GUAIFENESIN ER 30-600 MG PO TB12
1.0000 | ORAL_TABLET | Freq: Two times a day (BID) | ORAL | Status: DC
  Administered 2022-09-01 – 2022-09-03 (×5): 1 via ORAL
  Filled 2022-09-01 (×5): qty 1

## 2022-09-01 NOTE — Assessment & Plan Note (Addendum)
-  Continue adjusted doses of hydralazine, metoprolol and Cardizem now -Overall blood pressure stable and well-controlled. -Continue to follow vital signs trend. -Heart healthy/low-sodium diet discussed with patient.

## 2022-09-01 NOTE — Assessment & Plan Note (Addendum)
-   Was supposed to have home oxygen at last discharge - TOC consulted to set up home oxygen - Desaturation screening demonstrating patient needing 2 L nasal cannula supplementation to maintain saturation.

## 2022-09-01 NOTE — Assessment & Plan Note (Addendum)
-   Continue Brovana and Incruse - Continue mometasone - As needed Xopenex - And 5 days course of prednisone, for any mild COPD exacerbation contributing to his shortness of breath.

## 2022-09-01 NOTE — H&P (Signed)
History and Physical    Patient: Calvin Carroll. ZOX:[id number] DOB: [birth year] DOA: 08/31/2022 DOS: the patient was seen and examined on 09/01/2022 PCP: [facility]  Patient coming from: Home  Chief Complaint:  Chief Complaint  Patient presents with   Shortness of Breath   HPI: Calvin Carroll. is a 67 y.o. male with medical history significant of atrial fibrillation, COPD, hypertension, CAD, renal disorder, and more presents the ED with a chief complaint of dyspnea.  Patient reports he was discharged 2 weeks ago when he went to his PCP for hospital follow-up today.  He was found to be hypoxic and sent to the ER.  Patient reports that he has noticed dyspnea at home.  He has noticed wheezing at home.  His dyspnea is worse on exertion.  He has not had any chest pain.  He admits to cough that is nonproductive and palpitations that are worse with exertion.  He has felt lightheaded from time to time.  This is especially true when he goes from sitting to standing.  Patient denies any fever.  Patient has no other complaints at this time.  Patient considers himself a smoker but has not smoked since his last admission, advised him to just go ahead and quit now while he is ahead.  He drinks 1 beer per day.  He does not use illicit drugs.  He is vaccinated for COVID.  Patient is full code. Review of Systems: As mentioned in the history of present illness. All other systems reviewed and are negative. 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 He has been smoking an average of 1.5 packs per day. He has never used smokeless tobacco. He reports current alcohol use. He reports that he does not use drugs.  Allergies  Allergen Reactions   Atorvastatin Other (See Comments)    History reviewed. No pertinent family history.  Prior to Admission medications   Medication Sig Start Date End Date Taking? Authorizing Provider  apixaban (ELIQUIS) 2.5 MG TABS tablet Take 1 tablet (2.5 mg total) by mouth 2 (two) times daily. 08/19/22  Yes Johnson, Clanford L, MD  azaTHIOprine (IMURAN) 50 MG tablet Take 1 tablet by mouth daily. 05/11/22  Yes [provider]  fexofenadine (ALLEGRA) 180 MG tablet Take 180 mg by mouth daily as needed for allergies. 08/05/04  Yes [provider]  flunisolide (NASALIDE) 25 MCG/ACT (0.025%) SOLN Place 2 sprays into the nose 2 (two) times daily.   Yes [provider]  guaiFENesin (MUCINEX PO) Take 2 tablets by mouth every 12 (twelve) hours.   Yes [provider]  hydrALAZINE (APRESOLINE) 50 MG tablet Take 1 tablet (50 mg total) by mouth 3 (three) times daily. 08/19/22  Yes Johnson, Clanford L, MD  metoprolol tartrate (LOPRESSOR) 50 MG tablet Take 1 tablet (50 mg total) by mouth 2 (two) times daily. Patient taking  differently: Take 50 mg by mouth 2 (two) times daily. Pt has been taking 3 times daily 08/19/22  Yes Johnson, Clanford L, MD  Mometasone Furoate 200 MCG/ACT AERO Inhale 2 each into the lungs in the morning and at bedtime. 03/08/22  Yes [provider]  pantoprazole (PROTONIX) 40 MG tablet Take 40 mg by mouth daily before supper.   Yes [provider]  predniSONE (DELTASONE) 5 MG tablet Take 1 tablet (5 mg total) by mouth every evening. 08/29/22  Yes Johnson, Clanford L, MD  tacrolimus (PROGRAF) 0.5 MG capsule Take 0.5 mg by mouth See admin instructions. Take 0.5 mg  daily in the morning with the 1 mg capsule to equal 1.5 mg in the morning   Yes [provider]  tacrolimus (PROGRAF) 1 MG capsule Take 1 mg by mouth See admin instructions. Take 1 mg in the morning (take with 0.5 mg to equal 1.5 mg in the morning) and 2 mg in the evening   Yes [provider]  Tiotropium Bromide-Olodaterol (STIOLTO RESPIMAT) 2.5-2.5 MCG/ACT AERS Inhale 2 each into the lungs daily.   Yes [provider]  torsemide (DEMADEX) 20 MG tablet Take 20 mg by mouth as needed (fluid). 11/30/21  Yes [provider]  albuterol (PROVENTIL HFA;VENTOLIN HFA) 108 (90 Base) MCG/ACT inhaler Inhale 1-2 puffs into the lungs every 4 (four) hours as needed for wheezing or shortness of breath.    [provider]  apixaban (ELIQUIS) 5 MG TABS tablet TAKE ONE TABLET BY MOUTH TWICE A DAY (CAUTION: BLOOD THINNER) Patient not taking: Reported on 08/31/2022 05/19/22 08/31/22  [provider]  predniSONE (DELTASONE) 10 MG tablet TAKE THREE TABLETS BY MOUTH EVERY MORNING WITH FOOD FOR 3 DAYS, THEN TAKE TWO TABLETS EVERY MORNING FOR 3 DAYS, THEN TAKE ONE TABLET EVERY MORNING FOR 3 DAYS THEN RESUME 5MG  TABLET (TAKE WITH FOOD) Patient not taking: Reported on 08/31/2022 08/19/22   [provider]    Physical Exam: Vitals:   09/01/22 0000 09/01/22 0100 09/01/22 0200 09/01/22 0300  BP: (!) 135/98  125/71 134/76  Pulse: (!) 154 (!) 53 78 77  Resp: (!) 27 (!) 21 (!) 23 (!) 22  Temp:      TempSrc:      SpO2: 94% 94% 96% 96%  Weight:      Height:       1.  General: Patient lying supine in bed,  no acute distress   2. Psychiatric: Alert and oriented x 3, mood and behavior normal for situation, pleasant and cooperative with exam   3. Neurologic: Speech and language are normal, face is symmetric, moves all 4 extremities voluntarily, at baseline without acute deficits on limited exam   4. HEENMT:  Head is atraumatic, normocephalic, pupils reactive to light,  neck is supple, trachea is midline, mucous membranes are moist   5. Respiratory : Lungs are clear to auscultation bilaterally without wheezing, rhonchi, rales, no cyanosis, no increase in work of breathing or accessory muscle use, maintaining oxygen saturations on 2 L nasal cannula   6. Cardiovascular : Heart rate normal, rhythm is regular, no murmurs, rubs or gallops, no peripheral edema, peripheral pulses palpated   7. Gastrointestinal:  Abdomen is soft, nondistended, nontender to palpation bowel sounds active, no masses or organomegaly palpated   8. Skin:  Skin is warm, dry and intact without rashes, acute lesions, or ulcers on limited exam   9.Musculoskeletal:  No acute deformities or trauma, no asymmetry in tone, no peripheral  edema, peripheral pulses palpated, no tenderness to palpation in the extremities  Data Reviewed: In the ED Temp 98.4, heart rate 44-136, respiratory rate 23-33, blood pressure 113/72-148/88 No leukocytosis with a white blood cell count of 6.6, hemoglobin 12.2 Chemistries unremarkable Albumin is 2.9 Lactic acid 1.5 and 1.7 No family at bedside CTA chest is negative for acute pulmonary embolus.  Does show advanced emphysema and prior granulomatous disease Admission requested for A-fib with RVR and chronic hypoxic respiratory failure  Assessment and Plan: * Atrial fibrillation with RVR (HCC) - Heart rate up to 140 - Corrected on Cardizem bolus - Continue Cardizem bolus - Likely triggered by hypoxia, possibly by alcohol - Continue metoprolol, Eliquis, - Continue to monitor on telemetry  CAD (coronary artery disease) - Continue Lipitor, beta-blocker - Continue to monitor  GERD (gastroesophageal reflux disease) - Continue PPI  Chronic respiratory failure with hypoxia (HCC) - Was supposed to have home oxygen at last discharge - TOC consulted to set up home oxygen - Patient would like a new home oxygen company, as there was a personality conflict with  the last 1 - Continue to monitor  COPD (chronic obstructive pulmonary disease) (HCC) - Continue Brovana and Incruse - Continue mometasone - As needed Xopenex - Continue to monitor  HTN (hypertension) - Continue hydralazine and metoprolol      Advance Care Planning:   Code Status: Full Code  Consults: None at this time  Family Communication: No family at bedside  Severity of Illness: The appropriate patient status for this patient is INPATIENT. Inpatient status is judged to be reasonable and necessary in order to provide the required intensity of service to ensure the patient's safety. The patient's presenting symptoms, physical exam findings, and initial radiographic and laboratory data in the context of their chronic comorbidities is felt to place them at high risk for further clinical deterioration. Furthermore, it is not anticipated that the patient will be medically stable for discharge from the hospital within 2 midnights of admission.   * I certify that at the point of admission it is my clinical judgment that the patient will require inpatient hospital care spanning beyond 2 midnights from the point of admission due to high intensity of service, high risk for further deterioration and high frequency of surveillance required.*  Author: Lilyan Gilford, DO 09/01/2022 3:36 AM  For on call review [email or web address].

## 2022-09-01 NOTE — Progress Notes (Signed)
  Transition of Care [facility]) Screening Note   Patient Details  Name: Calvin Carroll. Date of Birth: 1956-04-24   Transition of Care [facility]) CM/SW Contact:    Annice Needy, LCSW Phone Number: 09/01/2022, 4:36 PM    Transition of Care Department [facility]) has reviewed patient and no TOC needs have been identified at this time. We will continue to monitor patient advancement through interdisciplinary progression rounds. If new patient transition needs arise, please place a TOC consult.    [facility] NOTIFICATION Your notification ID is: [id number]

## 2022-09-01 NOTE — Progress Notes (Signed)
Pt's HR suddenly went back to Aflutter RVR tonight, as high as 140s at 2130h. Upon assessment pt just verbalized feeling unusual and discomfort. Vitals stable aside from HR. MD informed. EKG done and 1 dose of Cardizem IV given as ordered. HR went down to 120s for a couple of minutes but immediately back to 140s. Restarted back on Cardizem gtt.

## 2022-09-01 NOTE — Assessment & Plan Note (Addendum)
-   Excellent tolerance and well-controlled heart rate with the use of Cardizem 240 mg daily along with metoprolol 50 mg twice a day. -Continue Eliquis for secondary prevention (based on improvement in his renal function/GFR dose has been adjusted to 5 mg twice a day following pharmacy recommendations).   -Continue outpatient follow-up with cardiology service.   -Continue close monitoring of patient's potassium and magnesium (goal is for potassium above 4 and magnesium above 2 as much as possible.

## 2022-09-01 NOTE — Progress Notes (Signed)
Patient heart rate noted to drop from being in the 70-80's down to the 40-50's. Cardizem drip turned off. Upon assessment patient had no complaints of pain, chest pain or discomfort and denied feeling short of breath. Patient sitting up in the bed alert and oriented x4. EKG performed and Dr Gwenlyn Perking made aware. Will continue to monitor.

## 2022-09-01 NOTE — Assessment & Plan Note (Addendum)
-   Continue statins and beta-blocker -No aspirin in the setting of chronic anticoagulation (Eliquis). -Patient denies chest pain -Continue outpatient follow-up with cardiology service.

## 2022-09-01 NOTE — Progress Notes (Signed)
Patient seen and examined; admitted after midnight secondary to increased shortness of breath, dyspnea exertion, palpitations and hypoxia.  Patient was discharged from the hospital about 2 weeks ago and he was supposed to receive oxygen supplementation (he never got it).  Has been seen by cardiology as an outpatient send adjustment to his rate controlling agents instructions to follow-up for potential need of ablation.  Patient reports no chest pain, is currently afebrile and expressing intermittent nonproductive coughing spells.  On his examination stable blood pressure and just mild wheezing appreciated on exam.  Please refer to H&P written by Dr. Carren Rang for further info/details on admission.  Plan: -Transition patient off Cardizem drip -Continue adjusted dose of metoprolol/hydralazine -Patient will be started on prednisone with intention to provide 5 days 40 mg daily to tackle component of COPD exacerbation. -Desaturation screening has demonstrated the need for oxygen supplementation (2 L); TOC has been made aware to assist getting oxygen for patient at discharge. -Continue Eliquis for secondary prevention. -follow electrolytes (with goal for potassium above 4 and magnesium above 2); follow clinical response.  Vassie Loll MD [phone]

## 2022-09-01 NOTE — Assessment & Plan Note (Signed)
Continue PPI ?

## 2022-09-01 NOTE — Progress Notes (Signed)
SATURATION QUALIFICATIONS: (This note is used to comply with regulatory documentation for home oxygen)  Patient Saturations on Room Air at Rest = 88%   Patient Saturations on 2 Liters of oxygen while At Rest = 94%  Please briefly explain why patient needs home oxygen: desats on room air

## 2022-09-02 DIAGNOSIS — I25119 Atherosclerotic heart disease of native coronary artery with unspecified angina pectoris: Secondary | ICD-10-CM | POA: Diagnosis not present

## 2022-09-02 DIAGNOSIS — I4891 Unspecified atrial fibrillation: Secondary | ICD-10-CM | POA: Diagnosis not present

## 2022-09-02 DIAGNOSIS — J441 Chronic obstructive pulmonary disease with (acute) exacerbation: Secondary | ICD-10-CM | POA: Diagnosis not present

## 2022-09-02 DIAGNOSIS — K219 Gastro-esophageal reflux disease without esophagitis: Secondary | ICD-10-CM | POA: Diagnosis not present

## 2022-09-02 LAB — BASIC METABOLIC PANEL
Anion gap: 8 (ref 5–15)
BUN: 38 mg/dL — ABNORMAL HIGH (ref 8–23)
CO2: 25 mmol/L (ref 22–32)
Calcium: 8.7 mg/dL — ABNORMAL LOW (ref 8.9–10.3)
Chloride: 100 mmol/L (ref 98–111)
Creatinine, Ser: 1.09 mg/dL (ref 0.61–1.24)
GFR, Estimated: >60 mL/min (ref >60)
Glucose, Bld: 172 mg/dL — ABNORMAL HIGH (ref 70–99)
Potassium: 4 mmol/L (ref 3.5–5.1)
Sodium: 133 mmol/L — ABNORMAL LOW (ref 135–145)

## 2022-09-02 LAB — MAGNESIUM: Magnesium: 1.8 mg/dL (ref 1.7–2.4)

## 2022-09-02 LAB — CULTURE, BLOOD (ROUTINE X 2): Culture: NO GROWTH

## 2022-09-02 MED ORDER — DILTIAZEM HCL ER 60 MG PO CP12
120.0000 mg | ORAL_CAPSULE | Freq: Two times a day (BID) | ORAL | Status: DC
  Administered 2022-09-02 (×2): 120 mg via ORAL
  Filled 2022-09-02 (×7): qty 2

## 2022-09-02 NOTE — TOC Progression Note (Addendum)
Transition of Care (TOC) - Progression Note    Patient Details  Name: Calvin Carroll. MRN: [id number] Date of Birth: 04-06-1956  Transition of Care [facility]) CM/SW Contact  Karn Cassis, [place] Phone Number: 09/02/2022, 2:44 PM  Clinical Narrative: L[facility] contacted [facility] this [facility] regarding [facility]2. Sent referral to [place] at [place]. Pt then stated he did not want to use [facility] for O2 and wants to use whoever can get it to him the quickest. He is agreeable to [facility]- O2 was referred to them recently. Ashly with [employer] said they will deliver O2 to home today. TOC to provide portable tank prior to d/c. LCSW updated [facility].         Barriers to Discharge: Continued Medical Work up  Expected Discharge Plan and Services                                               Social Determinants of Health (SDOH) Interventions SDOH Screenings   Food Insecurity: No Food Insecurity (08/31/2022)  Housing: Low Risk  (08/31/2022)  Transportation Needs: No Transportation Needs (08/31/2022)  Utilities: Not At Risk (08/31/2022)  Tobacco Use: High Risk (08/31/2022)    Readmission Risk Interventions    08/17/2022   11:20 AM  Readmission Risk Prevention Plan  Transportation Screening Complete  Home Care Screening Complete  Medication Review (RN CM) Complete

## 2022-09-02 NOTE — Progress Notes (Signed)
Progress Note   Patient: Calvin Carroll. ZOX:[id number] DOB: 11-09-1955 DOA: 08/31/2022     2 DOS: the patient was seen and examined on 09/02/2022   Brief hospital admission course: As per H&P written by Dr.Zierle-Ghosh on 02/01/23 Calvin Carroll. is a 67 y.o. male with medical history significant of atrial fibrillation, COPD, hypertension, CAD, renal disorder, and more presents the ED with a chief complaint of dyspnea.  Patient reports he was discharged 2 weeks ago when he went to his PCP for hospital follow-up today.  He was found to be hypoxic and sent to the ER.  Patient reports that he has noticed dyspnea at home.  He has noticed wheezing at home.  His dyspnea is worse on exertion.  He has not had any chest pain.  He admits to cough that is nonproductive and palpitations that are worse with exertion.  He has felt lightheaded from time to time.  This is especially true when he goes from sitting to standing.  Patient denies any fever.  Patient has no other complaints at this time.   Patient considers himself a smoker but has not smoked since his last admission, advised him to just go ahead and quit now while he is ahead.  He drinks 1 beer per day.  He does not use illicit drugs.  He is vaccinated for COVID.  Patient is full code.  Assessment and Plan: * Atrial fibrillation with RVR (HCC) - Attempt transition of Cardizem and drip to oral Cardizem -Continue metoprolol and Eliquis -Follow electrolytes and replete with goal for potassium above 4 and magnesium above 2 as much as possible. -Continue telemetry monitoring.  CAD (coronary artery disease) - Continue statins and beta-blocker -No aspirin in the setting of chronic anticoagulation (Eliquis). -Patient denies chest pain -Continue outpatient follow-up with cardiology service.  GERD (gastroesophageal reflux disease) - Continue PPI  Chronic respiratory failure with hypoxia (HCC) - Was supposed to have home oxygen at last  discharge - TOC consulted to set up home oxygen - Desaturation screening demonstrating patient needing 2 L nasal cannula supplementation to maintain saturation.  COPD (chronic obstructive pulmonary disease) (HCC) - Continue Brovana and Incruse - Continue mometasone - As needed Xopenex - And 5 days course of prednisone, for any mild COPD exacerbation contributing to his shortness of breath.  HTN (hypertension) - Continue adjusted doses of hydralazine, metoprolol and Cardizem now -Overall blood pressure stable -Follow-up vital signs. -Heart healthy/low-sodium diet discussed with patient.   Subjective:  Overnight with recurrence of A-fib with RVR in need of Cardizem drip reinitiation.  No fever, no chest pain, no nausea or vomiting.  Physical Exam: Vitals:   09/02/22 1200 09/02/22 1300 09/02/22 1400 09/02/22 1614  BP: (!) 148/88 123/70 (!) 110/45   Pulse: 81 70 70   Resp: (!) 27 (!) 22 19   Temp:    97.6 F (36.4 C)  TempSrc:    Oral  SpO2: 97% 95% 98%   Weight:      Height:       General exam: Alert, awake, oriented x 3; denying chest pain, no nausea, no vomiting.  Currently expressing no palpitations or chest pain. Respiratory system: Positive scattered rhonchi and mild expiratory wheezing appreciated on exam; no crackles.  No using accessory muscle.  Good saturation on 2 L supplementation. Cardiovascular system: Rate controlled, no rubs, no gallops, no JVD on exam. Gastrointestinal system: Abdomen is nondistended, soft and nontender. No organomegaly or masses felt. Normal bowel sounds heard.  Central nervous system: Alert and oriented. No focal neurological deficits. Extremities: No C/C/E, +pedal pulses Skin: No rashes, lesions or ulcers Psychiatry: Judgement and insight appear normal. Mood & affect appropriate.   Data Reviewed: Basic metabolic panel: Sodium 133, potassium 4.0, chloride 100, bicarb 25, BUN 38 creatinine 1.09 Magnesium:1.8  Family Communication: No family  at bedside.  Disposition: Status is: Inpatient Remains inpatient appropriate because: Continue treatment A-fib with RVR.    Planned Discharge Destination: Home   Time spent: 50 minutes  Author: Vassie Loll, MD 09/02/2022 4:35 PM  For on call review [email or web address].

## 2022-09-03 DIAGNOSIS — I10 Essential (primary) hypertension: Secondary | ICD-10-CM | POA: Diagnosis not present

## 2022-09-03 DIAGNOSIS — I4891 Unspecified atrial fibrillation: Secondary | ICD-10-CM | POA: Diagnosis not present

## 2022-09-03 DIAGNOSIS — K219 Gastro-esophageal reflux disease without esophagitis: Secondary | ICD-10-CM | POA: Diagnosis not present

## 2022-09-03 DIAGNOSIS — J441 Chronic obstructive pulmonary disease with (acute) exacerbation: Secondary | ICD-10-CM | POA: Diagnosis not present

## 2022-09-03 LAB — CULTURE, BLOOD (ROUTINE X 2): Special Requests: ADEQUATE

## 2022-09-03 MED ORDER — DILTIAZEM HCL ER COATED BEADS 240 MG PO CP24: 240.0000 mg | ORAL_CAPSULE | Freq: Every day | ORAL | 2 refills | Status: DC

## 2022-09-03 MED ORDER — DILTIAZEM HCL ER COATED BEADS 240 MG PO CP24
240.0000 mg | ORAL_CAPSULE | Freq: Every day | ORAL | Status: DC
  Administered 2022-09-03: 240 mg via ORAL
  Filled 2022-09-03 (×3): qty 1

## 2022-09-03 MED ORDER — HYDRALAZINE HCL 50 MG PO TABS: 50.0000 mg | ORAL_TABLET | Freq: Two times a day (BID) | ORAL | Status: DC

## 2022-09-03 MED ORDER — APIXABAN 5 MG PO TABS: 5.0000 mg | ORAL_TABLET | Freq: Two times a day (BID) | ORAL | 2 refills | Status: AC

## 2022-09-03 MED ORDER — SIMETHICONE 80 MG PO CHEW
80.0000 mg | CHEWABLE_TABLET | Freq: Once | ORAL | Status: AC
  Administered 2022-09-03: 80 mg via ORAL

## 2022-09-03 MED ORDER — LEVALBUTEROL TARTRATE 45 MCG/ACT IN AERO: 2.0000 | INHALATION_SPRAY | Freq: Four times a day (QID) | RESPIRATORY_TRACT | 2 refills | Status: DC | PRN

## 2022-09-03 MED ORDER — PREDNISONE 5 MG PO TABS: ORAL_TABLET | ORAL | Status: DC

## 2022-09-03 NOTE — TOC Transition Note (Signed)
Transition of Care [facility]) - CM/SW Discharge Note   Patient Details  Name: Calvin Carroll. MRN: [id number] Date of Birth: [birth year]  Transition of Care [facility]) CM/SW Contact:  Elliot Gault, LCSW Phone Number: 09/03/2022, 1:23 PM   Clinical Narrative:     Pt medically stable for dc today per MD. Portable O2 brought to pt in the room. RN updated. It has been confirmed that Home O2 has been delivered to pt's home already.  There are no other TOC needs for dc.  Final next level of care: Home/Self Care Barriers to Discharge: Barriers Resolved   Patient Goals and CMS Choice      Discharge Placement                         Discharge Plan and Services Additional resources added to the After Visit Summary for                                       Social Determinants of Health (SDOH) Interventions SDOH Screenings   Food Insecurity: No Food Insecurity (08/31/2022)  Housing: Low Risk  (08/31/2022)  Transportation Needs: No Transportation Needs (08/31/2022)  Utilities: Not At Risk (08/31/2022)  Tobacco Use: High Risk (08/31/2022)     Readmission Risk Interventions    08/17/2022   11:20 AM  Readmission Risk Prevention Plan  Transportation Screening Complete  Home Care Screening Complete  Medication Review (RN CM) Complete

## 2022-09-03 NOTE — Discharge Summary (Signed)
Physician Discharge Summary   Patient: Calvin Carroll. MRN: [id number] DOB: 1955-08-22  Admit date:     08/31/2022  Discharge date: 09/03/22  Discharge Physician: Vassie Loll   PCP: [facility]   Recommendations at discharge:  Repeat basic metabolic panel and magnesium level to follow electrolytes and renal function. Make sure patient follow-up with cardiology service as instructed. Reassess blood pressure and adjust antihypertensive regimen as needed   Discharge Diagnoses: Principal Problem:   Atrial fibrillation with RVR (HCC) Active Problems:   HTN (hypertension)   COPD exacerbation (HCC)   COPD (chronic obstructive pulmonary disease) (HCC)   Chronic respiratory failure with hypoxia (HCC)   GERD (gastroesophageal reflux disease)   CAD (coronary artery disease)   Brief hospital admission course: As per H&P written by Dr.Zierle-Ghosh on 02/01/23 [name]. is a 67 y.o. male with medical history significant of atrial fibrillation, COPD, hypertension, CAD, renal disorder, and more presents the ED with a chief complaint of dyspnea.  Patient reports he was discharged 2 weeks ago when he went to his PCP for hospital follow-up today.  He was found to be hypoxic and sent to the ER.  Patient reports that he has noticed dyspnea at home.  He has noticed wheezing at home.  His dyspnea is worse on exertion.  He has not had any chest pain.  He admits to cough that is nonproductive and palpitations that are worse with exertion.  He has felt lightheaded from time to time.  This is especially true when he goes from sitting to standing.  Patient denies any fever.  Patient has no other complaints at this time.   Patient considers himself a smoker but has not smoked since his last admission, advised him to just go ahead and quit now while he is ahead.  He drinks 1 beer per day.  He does not use illicit drugs.  He is vaccinated for COVID.  Patient is full code.  Assessment  and Plan: * Atrial fibrillation with RVR (HCC) - Excellent tolerance and well-controlled heart rate with the use of Cardizem 240 mg daily along with metoprolol 50 mg twice a day. -Continue Eliquis for secondary prevention (based on improvement in his renal function/GFR dose has been adjusted to 5 mg twice a day following pharmacy recommendations).   -Continue outpatient follow-up with cardiology service.   -Continue close monitoring of patient's potassium and magnesium (goal is for potassium above 4 and magnesium above 2 as much as possible.    CAD (coronary artery disease) - Continue statins and beta-blocker -No aspirin in the setting of chronic anticoagulation (Eliquis). -Patient denies chest pain -Continue outpatient follow-up with cardiology service.  GERD (gastroesophageal reflux disease) - Continue PPI  Chronic respiratory failure with hypoxia (HCC) - Patient has been discharged on 2 L nasal cannula supplementation to maintain saturation -Arrangement has been made to provide oxygen at discharge. -Appreciate assistance and recommendations by [facility] department.  COPD (chronic obstructive pulmonary disease) (HCC) - Continue Brovana and Incruse - Continue mometasone - As needed Xopenex - And 5 days course of prednisone, for any mild COPD exacerbation contributing to his shortness of breath as already mentioned.[name] Kitchen  COPD exacerbation (HCC) -Mild exacerbation appreciated and treated with steroids along with bronchodilator management -At discharge patient will resume home base bronchodilator therapy with the exception of getting Xopenex as rescue inhaler instead of albuterol due to underlying history of ongoing trouble with atrial fibrillation with RVR.  HTN (hypertension) -Continue adjusted doses of  hydralazine, metoprolol and Cardizem now -Overall blood pressure stable and well-controlled. -Continue to follow vital signs trend. -Heart healthy/low-sodium diet discussed with  patient.  Chronic diastolic heart failure -Stable and compensated -Continue to follow daily weights, low-sodium diet-resume the use of Demadex as previously prescribed. -Continue patient follow-up with cardiology service.   Consultants: Cardiology service curbside. Procedures performed: See below for x-ray report. Disposition: Home Diet recommendation: Heart healthy/low-sodium diet.  DISCHARGE MEDICATION: Allergies as of 09/03/2022       Reactions   Atorvastatin Other (See Comments)        Medication List     STOP taking these medications    albuterol 108 (90 Base) MCG/ACT inhaler Commonly known as: VENTOLIN HFA   benzonatate 100 MG capsule Commonly known as: TESSALON   ipratropium-albuterol 0.5-2.5 (3) MG/3ML Soln Commonly known as: DUONEB       TAKE these medications    apixaban 5 MG Tabs tablet Commonly known as: ELIQUIS Take 1 tablet (5 mg total) by mouth 2 (two) times daily. What changed:  medication strength how much to take   azaTHIOprine 50 MG tablet Commonly known as: IMURAN Take 1 tablet by mouth daily.   diltiazem 240 MG 24 hr capsule Commonly known as: CARDIZEM CD Take 1 capsule (240 mg total) by mouth daily.   fexofenadine 180 MG tablet Commonly known as: ALLEGRA Take 180 mg by mouth daily as needed for allergies.   flunisolide 25 MCG/ACT (0.025%) Soln Commonly known as: NASALIDE Place 2 sprays into the nose 2 (two) times daily.   hydrALAZINE 50 MG tablet Commonly known as: APRESOLINE Take 1 tablet (50 mg total) by mouth in the morning and at bedtime. What changed: when to take this   levalbuterol 45 MCG/ACT inhaler Commonly known as: XOPENEX HFA Inhale 2 puffs into the lungs every 6 (six) hours as needed for wheezing.   metoprolol tartrate 50 MG tablet Commonly known as: LOPRESSOR Take 1 tablet (50 mg total) by mouth 2 (two) times daily. What changed: additional instructions   Mometasone Furoate 200 MCG/ACT Aero Inhale 2 each  into the lungs in the morning and at bedtime.   MUCINEX PO Take 2 tablets by mouth every 12 (twelve) hours.   pantoprazole 40 MG tablet Commonly known as: PROTONIX Take 40 mg by mouth daily before supper.   predniSONE 5 MG tablet Commonly known as: DELTASONE Take 8 tablets daily x 2 days; 4 tablets daily x 2 days and resume daily 5 mg of prednisone. What changed:  how much to take how to take this when to take this additional instructions   Stiolto Respimat 2.5-2.5 MCG/ACT Aers Generic drug: Tiotropium Bromide-Olodaterol Inhale 2 each into the lungs daily.   tacrolimus 1 MG capsule Commonly known as: PROGRAF Take 1 mg by mouth See admin instructions. Take 1 mg in the morning (take with 0.5 mg to equal 1.5 mg in the morning) and 2 mg in the evening   tacrolimus 0.5 MG capsule Commonly known as: PROGRAF Take 0.5 mg by mouth See admin instructions. Take 0.5 mg daily in the morning with the 1 mg capsule to equal 1.5 mg in the morning   torsemide 20 MG tablet Commonly known as: DEMADEX Take 20 mg by mouth as needed (fluid).               Durable Medical Equipment  (From admission, onward)           Start     Ordered   09/02/22 [phone]  For home use only DME oxygen  Once       Question Answer Comment  Length of Need 12 Months   Mode or (Route) Nasal cannula   Liters per Minute 2   Frequency Continuous (stationary and portable oxygen unit needed)   Oxygen conserving device Yes   Oxygen delivery system Gas      09/02/22 0855            Follow-up Information     [facility] Schedule an appointment as soon as possible for a visit in 10 day(s).   Contact information: [address] [facility] [place] [address] [phone]                Discharge Exam: Filed Weights   08/31/22 2314 09/02/22 0500 09/03/22 0434  Weight: 49.4 kg 50.5 kg 51.5 kg   General exam: Alert, awake, oriented x 3; denying chest pain, nausea,  vomiting and palpitations. Respiratory system: Improved air movement bilaterally; no using accessory muscles.  No wheezing or crackles appreciated.  Good saturation on 2 L supplementation. Cardiovascular system: Rate controlled, no rubs, no gallops, no JVD on exam. Gastrointestinal system: Abdomen is nondistended, soft and nontender. No organomegaly or masses felt. Normal bowel sounds heard. Central nervous system: Alert and oriented. No focal neurological deficits. Extremities: No C/C/E, +pedal pulses Skin: No rashes, lesions or ulcers Psychiatry: Judgement and insight appear normal. Mood & affect appropriate.   Condition at discharge: Stable and improved.  The results of significant diagnostics from this hospitalization (including imaging, microbiology, ancillary and laboratory) are listed below for reference.   Imaging Studies: CT Angio Chest PE W/Cm &/Or Wo Cm  Addendum Date: 08/31/2022   ADDENDUM REPORT: 08/31/2022 20:03 ADDENDUM: Atrophic native kidneys partially imaged. Electronically Signed   By: Jasmine Pang M.D.   On: 08/31/2022 20:03   Result Date: 08/31/2022 CLINICAL DATA:  Respiratory failure EXAM: CT ANGIOGRAPHY CHEST WITH CONTRAST TECHNIQUE: Multidetector CT imaging of the chest was performed using the standard protocol during bolus administration of intravenous contrast. Multiplanar CT image reconstructions and MIPs were obtained to evaluate the vascular anatomy. RADIATION DOSE REDUCTION: This exam was performed according to the departmental dose-optimization program which includes automated exposure control, adjustment of the mA and/or kV according to patient size and/or use of iterative reconstruction technique. CONTRAST:  75mL OMNIPAQUE IOHEXOL 350 MG/ML SOLN COMPARISON:  Chest x-ray 08/31/2022, chest CT 08/12/2022 FINDINGS: Cardiovascular: Satisfactory opacification of the pulmonary arteries to the segmental level. No evidence of pulmonary embolism. Advanced aortic  atherosclerosis. No aneurysm. Coronary vascular calcification. Normal cardiac size. No pericardial effusion. Mitral calcification. 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 Mediastinum/Nodes: Midline trachea. No thyroid mass. Mildly enlarged lymph nodes. Right paratracheal nodes measure up to 8 mm. Precarinal lymph node measuring up to 14 mm. Calcified subcarinal nodes consistent with prior granulomatous disease. Right hilar nodes measuring up to 14 mm. Esophagus within normal limits. Lungs/Pleura: Advanced emphysema. No pleural effusion or pneumothorax. Calcified granulomas. Curvilinear densities in the lingula and right lower lobe are stable since January 2023 and presumably represent chronic foci of scarring. Previously noted right middle lobe reticular densities are improved. No new confluent consolidation is seen. Upper Abdomen: Atrophic kidneys.  No acute finding Musculoskeletal: No acute osseous abnormality. Review of the MIP images confirms the above findings. IMPRESSION: 1. Negative for acute pulmonary embolus. 2.  Advanced emphysema. Previously noted right middle lobe reticular densities are improved. No new confluent airspace disease. 3. Evidence of prior granulomatous disease. Probable scarring in the lingula and right lower lobe. 4. 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 5. Aortic atherosclerosis. Aortic Atherosclerosis (ICD10-I70.0) and Emphysema (ICD10-J43.9). Electronically Signed: By: Jasmine Pang M.D. On: 08/31/2022 18:35   DG Chest 2 View  Result Date: 08/31/2022 CLINICAL DATA:  Shortness of breath EXAM: CHEST - 2 VIEW COMPARISON:  Chest radiograph dated 08/19/2022 FINDINGS: Hyperinflated lungs. Similar coarse interstitial opacities with slightly increased  hazy lateral left mid lung and patchy left lingular opacities. No pleural effusion or pneumothorax. The heart size and mediastinal contours are within normal limits. Aortic atherosclerosis. No acute osseous abnormality. IMPRESSION: Slightly increased hazy lateral left mid lung and patchy left lingular opacities, which could represent atelectasis or pneumonia superimposed on emphysematous changes. Electronically Signed   By: Agustin Cree M.D.   On: 08/31/2022 16:30   DG CHEST PORT 1 VIEW  Result Date: 08/19/2022 CLINICAL DATA:  Pulmonary edema EXAM: PORTABLE CHEST 1 VIEW COMPARISON:  Chest x-ray dated August 18, 2022 FINDINGS: Cardiac and mediastinal contours are unchanged. Unchanged diffuse interstitial opacities. Possible trace bilateral pleural effusions. No evidence of pneumothorax IMPRESSION: Unchanged pulmonary edema. Electronically Signed   By: Allegra Lai M.D.   On: 08/19/2022 08:07   US Renal Transplant w/Doppler  Result Date: 08/18/2022 CLINICAL DATA:  Acute kidney injury EXAM: ULTRASOUND OF RENAL TRANSPLANT WITH RENAL DOPPLER ULTRASOUND TECHNIQUE: Ultrasound examination of the renal transplant was performed with gray-scale, color and duplex doppler evaluation. COMPARISON:  None Available. FINDINGS: Transplant kidney location: Right lower quadrant Transplant Kidney: Renal measurements: 11.9 x 5.7 x 6.4 cm = volume: 226.63mL. Normal in size and parenchymal echogenicity. No evidence of mass or hydronephrosis. No peri-transplant fluid collection seen. Color flow in the main renal artery: Preserved waveform. Preserved arborization on color Doppler. Color flow in the main renal vein:  Yes Duplex Doppler Evaluation: Main Renal Artery Velocity: 44.9 cm/sec Main Renal Artery Resistive Index: 0.8 Venous waveform in main renal vein:  Present Intrarenal resistive index in upper pole:  0.82 (normal 0.6-0.8; equivocal 0.8-0.9; abnormal >= 0.9) Intrarenal resistive index in lower pole: 0.73 (normal 0.6-0.8;  equivocal 0.8-0.9; abnormal >= 0.9) Bladder: Normal for degree of bladder distention. Other findings: Of note images were obtained of the native kidneys which are small and echogenic consistent with known previous correlate renal failure. There is a left-sided renal simple appearing cyst which is well-defined, thin wall than has through transmission. No specific follow-up. IMPRESSION: Right lower quadrant renal transplant. No collecting system dilatation or perinephric fluid. Preserved blood flow on Doppler. Of note please correlate with any prior examinations to assess for subtle change Electronically Signed   By: Karen Kays M.D.   On: 08/18/2022 15:35   DG CHEST PORT 1 VIEW  Result Date: 08/18/2022 CLINICAL DATA:  Shortness of breath. EXAM: PORTABLE CHEST 1 VIEW COMPARISON:  08/14/2022 and 08/12/2022 FINDINGS: Coarse lung markings with underlying emphysema. Negative for pneumothorax. Prominent lung markings have minimally changed since 08/14/2022 but more prominent compared to 08/12/2022. Heart size is normal and stable. Atherosclerotic calcifications at the aortic arch. Trachea is midline. IMPRESSION: 1. Chronic lung changes compatible with emphysema. 2. Concern for increased interstitial lung markings which could represent interstitial edema and/or atypical infection. Electronically Signed   By: Richarda Overlie M.D.   On: 08/18/2022 12:22   ECHOCARDIOGRAM COMPLETE  Result Date: 08/15/2022    ECHOCARDIOGRAM REPORT   Patient Name:   KAINOA BERLAND Date of Exam: 08/15/2022 Medical Rec #:  [id number]        Height:       68.0 in Accession #:    [id number]       Weight:       105.2 lb Date of Birth:  18-Nov-1955       BSA:          1.555 m Patient Age:    66 years         BP:           151/90 mmHg Patient Gender: M                HR:           67 bpm. [facility] Procedure: 2D Echo, Cardiac Doppler and Color Doppler Indications:    Atrial Fibrillation I48.91  History:        Patient has prior  history of Echocardiogram examinations, most                 recent 05/18/2021. Previous Myocardial Infarction, COPD,                 Arrythmias:Atrial Fibrillation; Risk Factors:Hypertension. Hx of                 COVID-19 virus infection.  Sonographer:    Celesta Gentile RCS Referring Phys: [phone] DAVID TAT IMPRESSIONS  1. Left ventricular ejection fraction, by estimation, is 60 to 65%. The left ventricle has normal function. The left ventricle has no regional wall motion abnormalities. Left ventricular diastolic function could not be evaluated due to atrial arrythmia.  The E/e' is 26.9.  2. Right ventricular systolic function is normal. The right ventricular size is normal. Tricuspid regurgitation signal is inadequate for assessing PA pressure.  3. Left atrial size was severely dilated.  4. Right atrial size was moderately dilated.  5. The mitral valve is abnormal. Trivial mitral valve regurgitation. No evidence of mitral stenosis. Moderate to severe mitral annular calcification.  6. The aortic valve was not well visualized. Visually, aortic valve regurgitation is mild but PHT measures 364 msec likely from diastolic dysfunction. Mild aortic valve stenosis. Aortic valve area, by VTI measures 1.03 cm. Aortic valve mean gradient measures 14.0 mmHg. Aortic valve Vmax measures 2.43 m/s.  7. Aortic dilatation noted. There is borderline dilatation of the aortic root, measuring 37 mm.  8. The inferior vena cava is dilated in size with <50% respiratory variability, suggesting right atrial pressure of 15 mmHg.  9. Evidence of atrial level shunting detected by color flow Doppler. There is a small patent foramen ovale with predominantly left to right shunting across the atrial septum. Comparison(s): No significant change from prior study. FINDINGS  Left Ventricle: Left ventricular ejection fraction, by estimation, is 60 to 65%. The left ventricle has normal function. The left ventricle has no regional wall motion abnormalities.  The left ventricular internal cavity size was normal in size. There is  no left ventricular hypertrophy. Left ventricular diastolic function could not be evaluated due to atrial fibrillation. Left ventricular diastolic function could not be evaluated. The E/e' is 26.9. Right Ventricle: The right ventricular size is normal. No increase in right ventricular wall thickness. Right ventricular systolic function is normal. Tricuspid regurgitation signal is inadequate for assessing PA pressure. Left Atrium: Left atrial size was severely dilated. Right Atrium: Right atrial  size was moderately dilated. Pericardium: There is no evidence of pericardial effusion. Mitral Valve: The mitral valve is abnormal. Moderate to severe mitral annular calcification. Trivial mitral valve regurgitation. No evidence of mitral valve stenosis. Tricuspid Valve: The tricuspid valve is normal in structure. Tricuspid valve regurgitation is trivial. No evidence of tricuspid stenosis. Aortic Valve: The aortic valve was not well visualized. Aortic valve regurgitation is mild. Aortic regurgitation PHT measures 364 msec. Mild aortic stenosis is present. Aortic valve mean gradient measures 14.0 mmHg. Aortic valve peak gradient measures 23.6 mmHg. Aortic valve area, by VTI measures 1.03 cm. Pulmonic Valve: The pulmonic valve was not well visualized. Pulmonic valve regurgitation is not visualized. No evidence of pulmonic stenosis. Aorta: Aortic dilatation noted. There is borderline dilatation of the aortic root, measuring 37 mm. Venous: The inferior vena cava is dilated in size with less than 50% respiratory variability, suggesting right atrial pressure of 15 mmHg. IAS/Shunts: Evidence of atrial level shunting detected by color flow Doppler. A small patent foramen ovale is detected with predominantly left to right shunting across the atrial septum.  LEFT VENTRICLE PLAX 2D LVIDd:         3.90 cm   Diastology LVIDs:         2.60 cm   LV e' medial:    6.20  cm/s LV PW:         0.90 cm   LV E/e' medial:  26.9 LV IVS:        1.20 cm   LV e' lateral:   7.94 cm/s LVOT diam:     1.90 cm   LV E/e' lateral: 21.0 LV SV:         58 LV SV Index:   37 LVOT Area:     2.84 cm  RIGHT VENTRICLE RV S prime:     12.20 cm/s TAPSE (M-mode): 2.0 cm LEFT ATRIUM              Index        RIGHT ATRIUM           Index LA diam:        4.20 cm  2.70 cm/m   RA Area:     24.00 cm LA Vol (A2C):   101.0 ml 64.95 ml/m  RA Volume:   74.50 ml  47.91 ml/m LA Vol (A4C):   102.0 ml 65.59 ml/m LA Biplane Vol: 103.0 ml 66.23 ml/m  AORTIC VALVE AV Area (Vmax):    0.94 cm AV Area (Vmean):   0.94 cm AV Area (VTI):     1.03 cm AV Vmax:           243.00 cm/s AV Vmean:          181.000 cm/s AV VTI:            0.564 m AV Peak Grad:      23.6 mmHg AV Mean Grad:      14.0 mmHg LVOT Vmax:         80.40 cm/s LVOT Vmean:        60.300 cm/s LVOT VTI:          0.204 m LVOT/AV VTI ratio: 0.36 AI PHT:            364 msec  AORTA Ao Root diam: 3.70 cm MITRAL VALVE MV Area (PHT): 5.54 cm     SHUNTS MV Decel Time: 137 msec     Systemic VTI:  0.20 m MV E velocity: 167.00 cm/s  Systemic  Diam: 1.90 cm MV A velocity: 52.80 cm/s MV E/A ratio:  3.16 Vishnu Priya Mallipeddi Electronically signed by Winfield Rast Mallipeddi Signature Date/Time: 08/15/2022/3:06:35 PM    Final    DG Chest 1 View  Result Date: 08/14/2022 CLINICAL DATA:  Shortness of breath EXAM: CHEST  1 VIEW COMPARISON:  08/12/2022 FINDINGS: Bilateral interstitial opacities with mild hyperinflation. Normal cardiomediastinal contours and pleural spaces. IMPRESSION: Bilateral interstitial opacities with mild hyperinflation, likely due to COPD. Electronically Signed   By: Deatra Robinson M.D.   On: 08/14/2022 19:55   CT CHEST WO CONTRAST  Result Date: 08/12/2022 CLINICAL DATA:  Dyspnea.  Chronic cough.  COPD. EXAM: CT CHEST WITHOUT CONTRAST TECHNIQUE: Multidetector CT imaging of the chest was performed following the standard protocol without IV contrast.  RADIATION DOSE REDUCTION: This exam was performed according to the departmental dose-optimization program which includes automated exposure control, adjustment of the mA and/or kV according to patient size and/or use of iterative reconstruction technique. COMPARISON:  X-ray 08/12/2022 and older.  CT angiogram 05/17/2021 FINDINGS: Cardiovascular: Trace pericardial fluid. Heart is nonenlarged. Coronary artery calcifications are seen. There also calcifications along the area of the aortic valve and mitral valve annulus. The thoracic aorta on this noncontrast exam has a normal course and caliber with prominent calcified plaque including along the origin of the great vessels, in particular the left subclavian artery. Mediastinum/Nodes: No specific abnormal lymph node enlargement identified in the axillary region or hilum. There are some prominent to mildly enlarged mediastinal nodes however for example right paratracheal posteriorly along the upper mediastinum on series 2, image 28 measuring 17 by 9 mm, precarinal on series 2, image 65 measuring 2.2 by 1.1 cm. These are unchanged from the previous examination. Other nodes are present as well left paratracheal, subcarinal. There are some lymph nodes in the mediastinum and hila with calcifications. Lungs/Pleura: Centrilobular emphysematous lung changes are identified. There are calcified lung nodules bilaterally consistent with old granulomatous disease. No pneumothorax or effusion. There is some ill-defined reticular parenchymal opacities identified in the right lower lobe, middle lobe and lingula. Areas in the right lower lobe and lingula were seen on the previous examination could be more chronic. There are some associated areas of bronchial wall thickening. The dependent ill-defined areas in the middle lobe are new from the examination of 2023. There is a noncalcified nodule measuring 4 mm in the inferior aspect of the right upper lobe on series 4, image 73 which is  stable on the prior and by report been stable since 2018 demonstrating long-term stability. Upper Abdomen: Along the upper abdomen the kidneys are severely atrophic. Slight thickening of the left adrenal gland is stable. Musculoskeletal: Mild degenerative changes are seen along the spine. IMPRESSION: Advanced emphysematous lung changes. There is some ill-defined opacity and reticular changes involving the right lower lobe greater than middle lobe and lingula. The areas in the right lower lobe and lingula were seen on the study of 2023. This could be chronic versus a recurrent acute process. The areas in the middle lobe were not seen at the time. Again an acute process is possible and recommend follow-up to confirm clearance. Evidence of old granulomatous disease. Stable mildly enlarged mediastinal lymph nodes Aortic Atherosclerosis (ICD10-I70.0) and Emphysema (ICD10-J43.9). Electronically Signed   By: Karen Kays M.D.   On: 08/12/2022 15:50   DG Chest Port 1 View  Result Date: 08/12/2022 CLINICAL DATA:  Shortness of breath EXAM: PORTABLE CHEST 1 VIEW COMPARISON:  Radiograph 05/26/2021, 05/17/2021 FINDINGS: Unchanged  cardiomediastinal silhouette. Emphysema with prominent interstitial opacities and peripheral reticulation, similar to previous exams. No new focal airspace disease. No large effusion or evidence of pneumothorax. IMPRESSION: Emphysema with chronic interstitial changes. No new focal airspace disease. Electronically Signed   By: Caprice Renshaw M.D.   On: 08/12/2022 11:51    Microbiology: Results for orders placed or performed during the hospital encounter of 08/31/22  Culture, blood (Routine x 2)     Status: None (Preliminary result)   Collection Time: 08/31/22  3:43 PM   Specimen: BLOOD RIGHT ARM  Result Value Ref Range Status   Specimen Description BLOOD RIGHT ARM  Final   Special Requests   Final    BOTTLES DRAWN AEROBIC AND ANAEROBIC Blood Culture adequate volume   Culture   Final    NO  GROWTH 2 DAYS Performed at [facility], [address] [place], [place] [address]    Report Status PENDING  Incomplete  Culture, blood (Routine x 2)     Status: None (Preliminary result)   Collection Time: 08/31/22  3:47 PM   Specimen: BLOOD RIGHT FOREARM  Result Value Ref Range Status   Specimen Description BLOOD RIGHT FOREARM  Final   Special Requests   Final    BOTTLES DRAWN AEROBIC AND ANAEROBIC Blood Culture adequate volume   Culture   Final    NO GROWTH 2 DAYS Performed at [facility], [address] [place], [place] [address]    Report Status PENDING  Incomplete    Labs: CBC: Recent Labs  Lab 08/31/22 1543 09/01/22 0434  WBC 6.6 3.1*  NEUTROABS 5.4 2.7  HGB 12.2* 11.0*  HCT 37.0* 33.0*  MCV 96.4 94.6  PLT 145* 120*   Basic Metabolic Panel: Recent Labs  Lab 08/31/22 1543 09/01/22 0434 09/02/22 0414  NA 133* 133* 133*  K 3.6 3.9 4.0  CL 98 101 100  CO2 27 26 25   GLUCOSE 82 194* 172*  BUN 22 22 38*  CREATININE 0.88 0.78 1.09  CALCIUM 8.5* 8.4* 8.7*  MG  --  1.8 1.8   Liver Function Tests: Recent Labs  Lab 08/31/22 1543 09/01/22 0434  AST 39 35  ALT 51* 42  ALKPHOS 64 57  BILITOT 1.0 0.7  PROT 6.6 6.2*  ALBUMIN 2.9* 2.6*   CBG: No results for input(s): "GLUCAP" in the last 168 hours.  Discharge time spent: greater than 30 minutes.  Signed: Vassie Loll, MD [facility] 09/03/2022

## 2022-09-03 NOTE — Assessment & Plan Note (Signed)
-  Mild exacerbation appreciated and treated with steroids along with bronchodilator management -At discharge patient will resume home base bronchodilator therapy with the exception of getting Xopenex as rescue inhaler instead of albuterol due to underlying history of ongoing trouble with atrial fibrillation with RVR.

## 2022-09-03 NOTE — Progress Notes (Signed)
Discharge instruction reviewed, patient and he verbalized understanding. My chart already set up according to patient. All IVs removed. All medication reviewed and the meds given to patient from the pharmacy.

## 2022-09-05 LAB — CULTURE, BLOOD (ROUTINE X 2)

## 2022-10-10 ENCOUNTER — Ambulatory Visit: Payer: Medicare PPO | Attending: Cardiology

## 2022-10-10 DIAGNOSIS — I10 Essential (primary) hypertension: Secondary | ICD-10-CM | POA: Diagnosis present

## 2022-10-10 DIAGNOSIS — I5032 Chronic diastolic (congestive) heart failure: Secondary | ICD-10-CM | POA: Diagnosis present

## 2022-10-10 DIAGNOSIS — I4891 Unspecified atrial fibrillation: Secondary | ICD-10-CM | POA: Diagnosis not present

## 2022-10-10 DIAGNOSIS — J449 Chronic obstructive pulmonary disease, unspecified: Secondary | ICD-10-CM | POA: Diagnosis present

## 2022-10-10 DIAGNOSIS — Z720 Tobacco use: Secondary | ICD-10-CM | POA: Diagnosis present

## 2022-10-10 LAB — BASIC METABOLIC PANEL
Anion gap: 14 (ref 5–15)
BUN: 15 mg/dL (ref 8–23)
CO2: 26 mmol/L (ref 22–32)
Calcium: 9.3 mg/dL (ref 8.9–10.3)
Chloride: 98 mmol/L (ref 98–111)
Creatinine, Ser: 0.93 mg/dL (ref 0.61–1.24)
GFR, Estimated: >60 mL/min (ref >60)
Glucose, Bld: 165 mg/dL — ABNORMAL HIGH (ref 70–99)
Potassium: 3.8 mmol/L (ref 3.5–5.1)
Sodium: 138 mmol/L (ref 135–145)

## 2022-10-10 LAB — CBC WITH DIFFERENTIAL/PLATELET
Abs Immature Granulocytes: 0.07 10*3/uL (ref 0.00–0.07)
Basophils Absolute: 0 10*3/uL (ref 0.0–0.1)
Basophils Relative: 0 %
Eosinophils Absolute: 0 10*3/uL (ref 0.0–0.5)
Eosinophils Relative: 0 %
HCT: 40.8 % (ref 39.0–52.0)
Hemoglobin: 12.9 g/dL — ABNORMAL LOW (ref 13.0–17.0)
Immature Granulocytes: 1 %
Lymphocytes Relative: 27 %
Lymphs Abs: 2 10*3/uL (ref 0.7–4.0)
MCH: 30 pg (ref 26.0–34.0)
MCHC: 31.6 g/dL (ref 30.0–36.0)
MCV: 94.9 fL (ref 80.0–100.0)
Monocytes Absolute: 0.7 10*3/uL (ref 0.1–1.0)
Monocytes Relative: 9 %
Neutro Abs: 4.6 10*3/uL (ref 1.7–7.7)
Neutrophils Relative %: 63 %
Platelets: 198 10*3/uL (ref 150–400)
RBC: 4.3 MIL/uL (ref 4.22–5.81)
RDW: 17.2 % — ABNORMAL HIGH (ref 11.5–15.5)
WBC: 7.3 10*3/uL (ref 4.0–10.5)
nRBC: 0 % (ref 0.0–0.2)

## 2022-10-17 ENCOUNTER — Telehealth: Payer: Self-pay

## 2022-10-17 NOTE — Telephone Encounter (Signed)
Pt's AF Ablation has been moved from 7/1 to 7/5 at 1:30 PM.   Lab appt or CT appt do not need to be changed.   Pt is aware of 11:30 AM arrival time.

## 2022-10-18 ENCOUNTER — Ambulatory Visit
Admission: RE | Admit: 2022-10-18 | Discharge: 2022-10-18 | Disposition: A | Discharge status: Home / Self Care | Payer: No Typology Code available for payment source | Source: Ambulatory Visit | Attending: Cardiology | Admitting: Cardiology

## 2022-10-18 DIAGNOSIS — I4891 Unspecified atrial fibrillation: Secondary | ICD-10-CM | POA: Insufficient documentation

## 2022-10-18 MED ORDER — IOHEXOL 350 MG/ML SOLN
75.0000 mL | Freq: Once | INTRAVENOUS | Status: AC | PRN
  Administered 2022-10-18: 75 mL via INTRAVENOUS

## 2022-10-26 NOTE — Pre-Procedure Instructions (Signed)
Instructed patient on the following items: Arrival time 1130 Nothing to eat or drink after midnight No meds AM of procedure Responsible person to drive you home and stay with you for 24 hrs  Have you missed any doses of anti-coagulant Eliquis- takes twice a day, hasn't missed any doses.  Don't take dose on Firday morning.  Ok to take renal transplant medication Friday morning.

## 2022-10-28 ENCOUNTER — Encounter (HOSPITAL_COMMUNITY)
Admission: RE | Disposition: A | Discharge status: Home / Self Care | Payer: Medicare PPO | Source: Home / Self Care | Attending: Internal Medicine

## 2022-10-28 ENCOUNTER — Observation Stay (HOSPITAL_COMMUNITY): Payer: No Typology Code available for payment source

## 2022-10-28 ENCOUNTER — Telehealth: Payer: Self-pay | Admitting: Cardiology

## 2022-10-28 ENCOUNTER — Other Ambulatory Visit: Payer: Self-pay

## 2022-10-28 ENCOUNTER — Encounter (HOSPITAL_COMMUNITY): Payer: Self-pay | Admitting: Anesthesiology

## 2022-10-28 ENCOUNTER — Inpatient Hospital Stay (HOSPITAL_COMMUNITY)
Admission: RE | Admit: 2022-10-28 | Discharge: 2022-11-02 | DRG: 309 | Disposition: A | Discharge status: Home / Self Care | Payer: No Typology Code available for payment source | Attending: Internal Medicine | Admitting: Internal Medicine

## 2022-10-28 ENCOUNTER — Encounter (HOSPITAL_COMMUNITY): Payer: Self-pay | Admitting: Cardiology

## 2022-10-28 DIAGNOSIS — H269 Unspecified cataract: Secondary | ICD-10-CM | POA: Insufficient documentation

## 2022-10-28 DIAGNOSIS — I252 Old myocardial infarction: Secondary | ICD-10-CM

## 2022-10-28 DIAGNOSIS — I4892 Unspecified atrial flutter: Secondary | ICD-10-CM | POA: Diagnosis not present

## 2022-10-28 DIAGNOSIS — I2510 Atherosclerotic heart disease of native coronary artery without angina pectoris: Secondary | ICD-10-CM | POA: Diagnosis present

## 2022-10-28 DIAGNOSIS — Z79899 Other long term (current) drug therapy: Secondary | ICD-10-CM

## 2022-10-28 DIAGNOSIS — Z79624 Long term (current) use of inhibitors of nucleotide synthesis: Secondary | ICD-10-CM

## 2022-10-28 DIAGNOSIS — Z940 Kidney transplant status: Secondary | ICD-10-CM

## 2022-10-28 DIAGNOSIS — Z7982 Long term (current) use of aspirin: Secondary | ICD-10-CM

## 2022-10-28 DIAGNOSIS — N186 End stage renal disease: Secondary | ICD-10-CM | POA: Insufficient documentation

## 2022-10-28 DIAGNOSIS — J9611 Chronic respiratory failure with hypoxia: Secondary | ICD-10-CM

## 2022-10-28 DIAGNOSIS — Z8701 Personal history of pneumonia (recurrent): Secondary | ICD-10-CM

## 2022-10-28 DIAGNOSIS — I471 Supraventricular tachycardia, unspecified: Secondary | ICD-10-CM

## 2022-10-28 DIAGNOSIS — T8612 Kidney transplant failure: Secondary | ICD-10-CM | POA: Diagnosis present

## 2022-10-28 DIAGNOSIS — I5032 Chronic diastolic (congestive) heart failure: Secondary | ICD-10-CM | POA: Diagnosis present

## 2022-10-28 DIAGNOSIS — N189 Chronic kidney disease, unspecified: Secondary | ICD-10-CM

## 2022-10-28 DIAGNOSIS — Z888 Allergy status to other drugs, medicaments and biological substances status: Secondary | ICD-10-CM

## 2022-10-28 DIAGNOSIS — F17210 Nicotine dependence, cigarettes, uncomplicated: Secondary | ICD-10-CM | POA: Diagnosis present

## 2022-10-28 DIAGNOSIS — Z79621 Long term (current) use of calcineurin inhibitor: Secondary | ICD-10-CM

## 2022-10-28 DIAGNOSIS — H353190 Nonexudative age-related macular degeneration, unspecified eye, stage unspecified: Secondary | ICD-10-CM | POA: Diagnosis present

## 2022-10-28 DIAGNOSIS — I25119 Atherosclerotic heart disease of native coronary artery with unspecified angina pectoris: Secondary | ICD-10-CM

## 2022-10-28 DIAGNOSIS — D631 Anemia in chronic kidney disease: Secondary | ICD-10-CM

## 2022-10-28 DIAGNOSIS — I4891 Unspecified atrial fibrillation: Secondary | ICD-10-CM

## 2022-10-28 DIAGNOSIS — E871 Hypo-osmolality and hyponatremia: Secondary | ICD-10-CM | POA: Diagnosis not present

## 2022-10-28 DIAGNOSIS — Z7952 Long term (current) use of systemic steroids: Secondary | ICD-10-CM

## 2022-10-28 DIAGNOSIS — T8619 Other complication of kidney transplant: Secondary | ICD-10-CM | POA: Diagnosis present

## 2022-10-28 DIAGNOSIS — I110 Hypertensive heart disease with heart failure: Secondary | ICD-10-CM | POA: Diagnosis present

## 2022-10-28 DIAGNOSIS — I4819 Other persistent atrial fibrillation: Principal | ICD-10-CM | POA: Diagnosis present

## 2022-10-28 DIAGNOSIS — Z7901 Long term (current) use of anticoagulants: Secondary | ICD-10-CM

## 2022-10-28 DIAGNOSIS — J449 Chronic obstructive pulmonary disease, unspecified: Secondary | ICD-10-CM

## 2022-10-28 DIAGNOSIS — R0602 Shortness of breath: Secondary | ICD-10-CM

## 2022-10-28 DIAGNOSIS — K219 Gastro-esophageal reflux disease without esophagitis: Secondary | ICD-10-CM

## 2022-10-28 DIAGNOSIS — J441 Chronic obstructive pulmonary disease with (acute) exacerbation: Secondary | ICD-10-CM | POA: Diagnosis present

## 2022-10-28 DIAGNOSIS — I10 Essential (primary) hypertension: Secondary | ICD-10-CM

## 2022-10-28 LAB — CBC
HCT: 37.3 % — ABNORMAL LOW (ref 39.0–52.0)
Hemoglobin: 12.3 g/dL — ABNORMAL LOW (ref 13.0–17.0)
MCH: 30.8 pg (ref 26.0–34.0)
MCHC: 33 g/dL (ref 30.0–36.0)
MCV: 93.5 fL (ref 80.0–100.0)
Platelets: 170 10*3/uL (ref 150–400)
RBC: 3.99 MIL/uL — ABNORMAL LOW (ref 4.22–5.81)
RDW: 17 % — ABNORMAL HIGH (ref 11.5–15.5)
WBC: 4.2 10*3/uL (ref 4.0–10.5)
nRBC: 0 % (ref 0.0–0.2)

## 2022-10-28 LAB — BLOOD GAS, VENOUS
Acid-Base Excess: 0.5 mmol/L (ref 0.0–2.0)
Bicarbonate: 24.6 mmol/L (ref 20.0–28.0)
Drawn by: 42628
O2 Saturation: 82 %
Patient temperature: 36.6
pCO2, Ven: 36 mmHg — ABNORMAL LOW (ref 44–60)
pH, Ven: 7.44 — ABNORMAL HIGH (ref 7.25–7.43)
pO2, Ven: 43 mmHg (ref 32–45)

## 2022-10-28 LAB — COMPREHENSIVE METABOLIC PANEL
ALT: 17 U/L (ref 0–44)
AST: 27 U/L (ref 15–41)
Albumin: 3.3 g/dL — ABNORMAL LOW (ref 3.5–5.0)
Alkaline Phosphatase: 85 U/L (ref 38–126)
Anion gap: 11 (ref 5–15)
BUN: 17 mg/dL (ref 8–23)
CO2: 23 mmol/L (ref 22–32)
Calcium: 9.4 mg/dL (ref 8.9–10.3)
Chloride: 103 mmol/L (ref 98–111)
Creatinine, Ser: 1.28 mg/dL — ABNORMAL HIGH (ref 0.61–1.24)
GFR, Estimated: >60 mL/min (ref >60)
Glucose, Bld: 93 mg/dL (ref 70–99)
Potassium: 4.1 mmol/L (ref 3.5–5.1)
Sodium: 137 mmol/L (ref 135–145)
Total Bilirubin: 1.1 mg/dL (ref 0.3–1.2)
Total Protein: 6.6 g/dL (ref 6.5–8.1)

## 2022-10-28 LAB — PROTIME-INR
INR: 1.4 — ABNORMAL HIGH (ref 0.8–1.2)
Prothrombin Time: 17.5 seconds — ABNORMAL HIGH (ref 11.4–15.2)

## 2022-10-28 LAB — TROPONIN I (HIGH SENSITIVITY)
Troponin I (High Sensitivity): 28 ng/L — ABNORMAL HIGH (ref <18)
Troponin I (High Sensitivity): 34 ng/L — ABNORMAL HIGH (ref <18)

## 2022-10-28 LAB — MAGNESIUM
Magnesium: 1.6 mg/dL — ABNORMAL LOW (ref 1.7–2.4)
Magnesium: 3 mg/dL — ABNORMAL HIGH (ref 1.7–2.4)

## 2022-10-28 LAB — BRAIN NATRIURETIC PEPTIDE: B Natriuretic Peptide: 1495.6 pg/mL — ABNORMAL HIGH (ref 0.0–100.0)

## 2022-10-28 SURGERY — ATRIAL FIBRILLATION ABLATION
Anesthesia: General

## 2022-10-28 MED ORDER — ARFORMOTEROL TARTRATE 15 MCG/2ML IN NEBU
15.0000 ug | INHALATION_SOLUTION | Freq: Two times a day (BID) | RESPIRATORY_TRACT | Status: DC
  Administered 2022-10-29 – 2022-11-02 (×9): 15 ug via RESPIRATORY_TRACT
  Filled 2022-10-28 (×9): qty 2

## 2022-10-28 MED ORDER — SODIUM CHLORIDE 0.9% FLUSH: 3.0000 mL | INTRAVENOUS | Status: DC | PRN

## 2022-10-28 MED ORDER — NICOTINE 14 MG/24HR TD PT24
14.0000 mg | MEDICATED_PATCH | Freq: Every day | TRANSDERMAL | Status: DC
  Administered 2022-10-28 – 2022-11-02 (×6): 14 mg via TRANSDERMAL
  Filled 2022-10-28 (×6): qty 1

## 2022-10-28 MED ORDER — UMECLIDINIUM BROMIDE 62.5 MCG/ACT IN AEPB
1.0000 | INHALATION_SPRAY | Freq: Every day | RESPIRATORY_TRACT | Status: DC
  Administered 2022-10-29 – 2022-11-02 (×5): 1 via RESPIRATORY_TRACT
  Filled 2022-10-28: qty 7

## 2022-10-28 MED ORDER — SODIUM CHLORIDE 0.9% FLUSH
3.0000 mL | Freq: Two times a day (BID) | INTRAVENOUS | Status: DC
  Administered 2022-10-28 – 2022-11-02 (×8): 3 mL via INTRAVENOUS

## 2022-10-28 MED ORDER — METOPROLOL TARTRATE 50 MG PO TABS
50.0000 mg | ORAL_TABLET | Freq: Two times a day (BID) | ORAL | Status: DC
  Administered 2022-10-28 – 2022-11-02 (×10): 50 mg via ORAL
  Filled 2022-10-28 (×10): qty 1

## 2022-10-28 MED ORDER — PANTOPRAZOLE SODIUM 40 MG PO TBEC
40.0000 mg | DELAYED_RELEASE_TABLET | Freq: Every day | ORAL | Status: DC
  Administered 2022-10-28 – 2022-11-01 (×5): 40 mg via ORAL
  Filled 2022-10-28 (×5): qty 1

## 2022-10-28 MED ORDER — DILTIAZEM HCL-DEXTROSE 125-5 MG/125ML-% IV SOLN (PREMIX)
5.0000 mg/h | INTRAVENOUS | Status: AC
  Administered 2022-10-28: 12.5 mg/h via INTRAVENOUS
  Administered 2022-10-28: 5 mg/h via INTRAVENOUS
  Administered 2022-10-29 – 2022-10-31 (×5): 12.5 mg/h via INTRAVENOUS
  Filled 2022-10-28 (×7): qty 125

## 2022-10-28 MED ORDER — DOFETILIDE 250 MCG PO CAPS
250.0000 ug | ORAL_CAPSULE | Freq: Two times a day (BID) | ORAL | Status: DC
  Administered 2022-10-28 – 2022-10-29 (×3): 250 ug via ORAL
  Filled 2022-10-28 (×3): qty 1

## 2022-10-28 MED ORDER — DILTIAZEM LOAD VIA INFUSION
5.0000 mg | Freq: Once | INTRAVENOUS | Status: AC
  Administered 2022-10-28: 5 mg via INTRAVENOUS
  Filled 2022-10-28: qty 5

## 2022-10-28 MED ORDER — IPRATROPIUM BROMIDE 0.02 % IN SOLN
0.5000 mg | Freq: Four times a day (QID) | RESPIRATORY_TRACT | Status: DC
  Administered 2022-10-28 – 2022-10-29 (×2): 0.5 mg via RESPIRATORY_TRACT
  Filled 2022-10-28 (×2): qty 2.5

## 2022-10-28 MED ORDER — ACETAMINOPHEN 500 MG PO TABS
1000.0000 mg | ORAL_TABLET | Freq: Once | ORAL | Status: DC
  Filled 2022-10-28: qty 2

## 2022-10-28 MED ORDER — BUDESONIDE 0.5 MG/2ML IN SUSP: 0.5000 mg | Freq: Two times a day (BID) | RESPIRATORY_TRACT | Status: DC

## 2022-10-28 MED ORDER — ACETAMINOPHEN 650 MG RE SUPP: 650.0000 mg | Freq: Four times a day (QID) | RECTAL | Status: DC | PRN

## 2022-10-28 MED ORDER — UMECLIDINIUM BROMIDE 62.5 MCG/ACT IN AEPB
1.0000 | INHALATION_SPRAY | Freq: Every day | RESPIRATORY_TRACT | Status: DC
  Filled 2022-10-28: qty 7

## 2022-10-28 MED ORDER — IPRATROPIUM BROMIDE 0.02 % IN SOLN: 0.5000 mg | Freq: Four times a day (QID) | RESPIRATORY_TRACT | Status: DC

## 2022-10-28 MED ORDER — APIXABAN 5 MG PO TABS
5.0000 mg | ORAL_TABLET | Freq: Two times a day (BID) | ORAL | Status: DC
  Administered 2022-10-28 – 2022-11-02 (×11): 5 mg via ORAL
  Filled 2022-10-28 (×12): qty 1

## 2022-10-28 MED ORDER — ACETAMINOPHEN 325 MG PO TABS
650.0000 mg | ORAL_TABLET | Freq: Four times a day (QID) | ORAL | Status: DC | PRN
  Administered 2022-10-29: 650 mg via ORAL
  Filled 2022-10-28: qty 2

## 2022-10-28 MED ORDER — METHYLPREDNISOLONE SODIUM SUCC 40 MG IJ SOLR
40.0000 mg | Freq: Once | INTRAMUSCULAR | Status: AC
  Administered 2022-10-28: 40 mg via INTRAVENOUS
  Filled 2022-10-28: qty 1

## 2022-10-28 MED ORDER — ASPIRIN 81 MG PO TBEC
81.0000 mg | DELAYED_RELEASE_TABLET | Freq: Every day | ORAL | Status: DC
  Administered 2022-10-29 – 2022-11-02 (×5): 81 mg via ORAL
  Filled 2022-10-28 (×5): qty 1

## 2022-10-28 MED ORDER — MYCOPHENOLATE SODIUM 180 MG PO TBEC
180.0000 mg | DELAYED_RELEASE_TABLET | Freq: Two times a day (BID) | ORAL | Status: DC
  Administered 2022-10-28 – 2022-11-02 (×11): 180 mg via ORAL
  Filled 2022-10-28 (×12): qty 1

## 2022-10-28 MED ORDER — TACROLIMUS 0.5 MG PO CAPS
1.5000 mg | ORAL_CAPSULE | Freq: Every morning | ORAL | Status: DC
  Administered 2022-10-28 – 2022-11-02 (×6): 1.5 mg via ORAL
  Filled 2022-10-28 (×6): qty 3

## 2022-10-28 MED ORDER — SODIUM CHLORIDE 0.9% FLUSH
3.0000 mL | Freq: Two times a day (BID) | INTRAVENOUS | Status: DC
  Administered 2022-10-28 – 2022-11-02 (×7): 3 mL via INTRAVENOUS

## 2022-10-28 MED ORDER — TACROLIMUS 1 MG PO CAPS
2.0000 mg | ORAL_CAPSULE | Freq: Every day | ORAL | Status: DC
  Administered 2022-10-28 – 2022-11-01 (×5): 2 mg via ORAL
  Filled 2022-10-28 (×6): qty 2

## 2022-10-28 MED ORDER — POLYETHYLENE GLYCOL 3350 17 G PO PACK: 17.0000 g | PACK | Freq: Every day | ORAL | Status: DC | PRN

## 2022-10-28 MED ORDER — BUDESONIDE 0.5 MG/2ML IN SUSP
0.5000 mg | Freq: Two times a day (BID) | RESPIRATORY_TRACT | Status: DC
  Administered 2022-10-28 – 2022-11-02 (×10): 0.5 mg via RESPIRATORY_TRACT
  Filled 2022-10-28 (×10): qty 2

## 2022-10-28 MED ORDER — SODIUM CHLORIDE 0.9 % IV SOLN: INTRAVENOUS | Status: DC

## 2022-10-28 MED ORDER — HYDRALAZINE HCL 50 MG PO TABS
50.0000 mg | ORAL_TABLET | Freq: Two times a day (BID) | ORAL | Status: DC
  Administered 2022-10-28 – 2022-11-02 (×10): 50 mg via ORAL
  Filled 2022-10-28 (×10): qty 1

## 2022-10-28 MED ORDER — PREDNISONE 20 MG PO TABS
40.0000 mg | ORAL_TABLET | Freq: Every day | ORAL | Status: DC
  Administered 2022-10-29 – 2022-10-31 (×3): 40 mg via ORAL
  Filled 2022-10-28 (×4): qty 2

## 2022-10-28 MED ORDER — ALBUTEROL SULFATE (2.5 MG/3ML) 0.083% IN NEBU: 2.5000 mg | INHALATION_SOLUTION | RESPIRATORY_TRACT | Status: DC | PRN

## 2022-10-28 MED ORDER — ARFORMOTEROL TARTRATE 15 MCG/2ML IN NEBU: 15.0000 ug | INHALATION_SOLUTION | Freq: Two times a day (BID) | RESPIRATORY_TRACT | Status: DC

## 2022-10-28 MED ORDER — MAGNESIUM SULFATE 4 GM/100ML IV SOLN
4.0000 g | Freq: Once | INTRAVENOUS | Status: AC
  Administered 2022-10-28: 4 g via INTRAVENOUS
  Filled 2022-10-28: qty 100

## 2022-10-28 MED ORDER — PREDNISONE 5 MG PO TABS: 5.0000 mg | ORAL_TABLET | Freq: Every day | ORAL | Status: DC

## 2022-10-28 MED ORDER — SODIUM CHLORIDE 0.9 % IV SOLN: 250.0000 mL | INTRAVENOUS | Status: DC | PRN

## 2022-10-28 NOTE — Progress Notes (Addendum)
AAD options are limited. Patient confirmed no missed doses of Eliquis at home the prior 3 weeks or longer. None this AM prior to coming in though did get the dose late this morning here Amiodarone not contraindicated though would require marked dose reduction in his Tacrolimus, and less then ideal given his pulmonary disease  Tikosyn will not affect Tacrolimus levels Tacrolimus can contribute to QT prolongation but not contraindicated.  His last SR/low atrial rhythm EKG manually measured QT is 440-422ms, corrects to 435-457, with RBBB , QTc would be OK for Tikosyn HRs have slowed some on diltiazem Repeat EKG is AFlutter 95 with manually measured QRS , QT and QTc   Plan for Tikosyn BID with calc CrCl of 42 Electrolyte replacement with pharmacy  Francis Dowse, PA-C

## 2022-10-28 NOTE — Progress Notes (Signed)
Report given to Freddy Jaksch on 6E. Reported EKG, CXR and Eliquis were all completed prior to transport.

## 2022-10-28 NOTE — Consult Note (Signed)
Electrophysiology Consultation   Patient ID: [name]. MRN: [id number]; DOB: 1955-11-14  Admit date: 10/28/2022 Date of Consult: 10/28/2022  PCP:  Clinic, [facility] Providers Cardiologist:  None  Electrophysiologist:  Lanier Prude, MD  {    History of Present Illness:   Mr. Whipp is a pleasant 67yo man with COPD, tobacco abuse, AF/AFL, hx of renal transplants who I am seeing today in consultation for his AF/AFL. He presented today for PVI but the procedure was cancelled due to respiratory distress over the past several days. He reports no fever/chills. Does have increased sputum production. Feels like his heart has been more out of rhythm. No missed OAC doses.   Past medical, surgical, social and family history reviewed.     ROS:  Please see the history of present illness.  All other ROS reviewed and negative.     Physical Exam/Data:   Vitals:   10/28/22 1700 10/28/22 2006 10/28/22 2120 10/28/22 2153  BP:  128/79 139/82   Pulse:  100 100   Resp:  20    Temp:  97.6 F (36.4 C)    TempSrc:  Oral    SpO2:  92%  94%  Weight: 48.1 kg     Height: 5\' 8"  (1.727 m)       Intake/Output Summary (Last 24 hours) at 10/28/2022 2154 Last data filed at 10/28/2022 2009 Gross per 24 hour  Intake 50.24 ml  Output 200 ml  Net -149.76 ml      10/28/2022    5:00 PM 09/03/2022    4:34 AM 09/02/2022    5:00 AM  Last 3 Weights  Weight (lbs) 106 lb 0.7 oz 113 lb 8.6 oz 111 lb 5.3 oz  Weight (kg) 48.1 kg 51.5 kg 50.5 kg     Body mass index is 16.12 kg/m.   General:  Chronically ill appearing, mild increased work of breathing, no distress in bed at 45 degrees. Frequently clearing throat/coughing. Cardiac:  tachycardic, warm extremities, non edematous.  Lungs:  right lower lobes with coarse breath sounds. Hyperinflated. Psych:  Normal affect       EKG:  The EKG was personally reviewed and demonstrates:    Today, AFL ECG with  manually measured QRS , QT 400 and QTc .  Sinus ECG with QTc manually measured at .    Telemetry:  Telemetry was personally reviewed and demonstrates:  AFL with variable AV conduction  Relevant CV Studies:  08/15/2022 Echo - EF 60, RV normal, LA dilated. Trivial MR. Mild AS.     Assessment and Plan:   Mr Munier is a very pleasant 67yo man with COPD, ongoing tobacco abuse, hx of renal transplants and AF/AFL w RVR who presented today for AF ablation but complained of subacute worsening of his respiratory status leading to procedure cancellation and admission to medicine. His AF seems to be contributing to his symptoms but is not the primary driver (COPD).   #AF/AFL Persistent. Treatment options are limited due to his comorbid medical conditions. We discussed using amiodarone and dofetilide as a bridge to ablation candidacy. Given his severe lung disease and use of tacrolimus, I do not think amiodarone is the best choice. This was discussed in detail with the patient and his daughter/son in law at the bedside. I discussed tikosyn in detail. He is a candidate for Dofetilide although it will have to be monitored closely given concomitant use of tacrolimus. His QTc when corrected for  his widened QRS is today and has been while in sinus rhythm. His kidney function will allow for a dose. He will start this loading regimen this evening. He will require DCCV if not back in sinus rhythm after dose #4.   Continue Eliquis. He has not missed any doses in the past month.   Continue diltiazem drip for now to help control ventricular rates. Will likely be stopped once back in sinus rhythm.   #Increased work of breathing #Cough/mucous production #COPD hx #Tobacco abuse Appreciate IM admitting patient and managing his respiratory status. I did discuss how his respiratory status is blocking his candidacy for catheter based treatment of his AF/AFL. Nicotine patch ordered.  Tobacco cessation encouraged.  EP to follow along.   For questions or updates, please contact [facility] Please consult [email or web address] for contact info under    Signed, Lanier Prude, MD  10/28/2022 9:54 PM

## 2022-10-28 NOTE — Progress Notes (Signed)
Pharmacy: Dofetilide (Tikosyn) - Initial Consult Assessment and Electrolyte Replacement  Pharmacy consulted to assist in monitoring and replacing electrolytes in this 67 y.o. male admitted on 10/28/2022 undergoing dofetilide initiation. First dofetilide dose:  BID to begin 10/28/2022 2100.  Assessment:  Patient Exclusion Criteria: If any screening criteria checked as "Yes", then  patient  should NOT receive dofetilide until criteria item is corrected.  If "Yes" please indicate correction plan.  YES  NO Patient  Exclusion Criteria Correction Plan   []   [x]   Baseline QTc interval is greater than or equal to 440 msec. IF above YES box checked dofetilide contraindicated unless patient has ICD; then may proceed if QTc 500-550 msec or with known ventricular conduction abnormalities may proceed with QTc 550-600 msec. QTc = 0.57 >> calculated at 463, 420-440 baseline per EP PA    []   [x]   Patient is known or suspected to have a digoxin level greater than 2 ng/ml: No results found for: "DIGOXIN"     []   [x]   Creatinine clearance less than 20 ml/min (calculated using Cockcroft-Gault, actual body weight and serum creatinine): Estimated Creatinine Clearance: 39.1 mL/min (A) (by C-G formula based on SCr of 1.28 mg/dL (H)).     [x]   []  Patient has received drugs known to prolong the QT intervals within the last 48 hours (phenothiazines, tricyclics or tetracyclic antidepressants, erythromycin, H-1 antihistamines, cisapride, fluoroquinolones, azithromycin, ondansetron).   Patient was prescribed azithromycin 250 mg MWF on 7/2. Per patient, only took 1 dose on 7/3.  Informed EP PA.  Updated information on QT prolonging agents is available to be searched on the following database:QT prolonging agents     []   [x]   Patient received a dose of hydrochlorothiazide (Oretic) alone or in any combination including triamterene (Dyazide, Maxzide) in the last 48 hours.    []   [x]  Patient received a  medication known to increase dofetilide plasma concentrations prior to initial dofetilide dose:  Trimethoprim (Primsol, Proloprim) in the last 36 hours Verapamil (Calan, Verelan) in the last 36 hours or a sustained release dose in the last 72 hours Megestrol (Megace) in the last 5 days  Cimetidine (Tagamet) in the last 6 hours Ketoconazole (Nizoral) in the last 24 hours Itraconazole (Sporanox) in the last 48 hours  Prochlorperazine (Compazine) in the last 36 hours     []   [x]   Patient is known to have a history of torsades de pointes; congenital or acquired long QT syndromes.    []   [x]   Patient has received a Class 1 antiarrhythmic with less than 2 half-lives since last dose. (Disopyramide, Quinidine, Procainamide, Lidocaine, Mexiletine, Flecainide, Propafenone)    []   [x]   Patient has received amiodarone therapy in the past 3 months or amiodarone level is greater than 0.3 ng/ml.    Labs:    Component Value Date/Time   K 4.1 10/28/2022 1420   MG 3.0 (H) 10/28/2022 2002     Plan: Select One Calculated CrCl  Dose q12h  []  > 60 ml/min 500 mcg  [x]  40-60 ml/min 250 mcg  []  20-40 ml/min 125 mcg   [x]   Physician selected initial dose within range recommended for patients level of renal function - will monitor for response.  []   Physician selected initial dose outside of range recommended for patients level of renal function - will discuss if the dose should be altered at this time.   Patient has been appropriately anticoagulated with apixaban 5 mg BID. Confirmed no missed doses in the  last 4 weeks.  Potassium: K >/= 4: Appropriate to initiate Tikosyn, no replacement needed    Magnesium: Mg 1.3-1.7: Hold Tikosyn initiation and give Mg 4 gm x1 IV, recheck Mg 4hr after end of infusion - repeat appropriate dose if not > 1.8    Update >> Repeat Mg 3.  OK to start Tikosyn.   Thank you for allowing pharmacy to participate in this patient's care   Romie Minus,  PharmD PGY1 Pharmacy Resident  Please check AMION for all [facility] Pharm[facility] phone numbers After 10:00 PM, call Main Pharmacy [phone], Pharm.D., BCPS Clinical Pharmacist  10/28/2022 8:45 PM

## 2022-10-28 NOTE — H&P (Addendum)
History and Physical   [name]. XBM:[id number] DOB: [birth year] DOA: 10/28/2022  PCP: [facility]   Patient coming from: EP outpatient procedure  Chief Complaint: Shortness of breath  HPI/Course: Calvin Carroll. is a 67 y.o. male with medical history significant of hypertension, atrial flutter, CAD, GERD, anemia, diastolic CHF, ESRD status post renal transplant, COPD, chronic respiratory failure with hypoxia, cataract, macular degeneration presenting today for atrial fibrillation and consideration of ablation but found to be significantly short of breath and not currently candidate for procedure.  As above patient was presenting today for evaluation by electrophysiology for ablation versus antiarrhythmics.  Patient was referred by outpatient provider after he had onset of atrial fibrillation in 2023 and diastolic CHF exacerbation secondary to this.  Heart rate has been as high as the 170s with RVR.  Now having 2-3 episodes of breakthrough atrial fibrillation per week and is symptomatic with and has fatigue each time.  He is currently on Eliquis.  Plan was to proceed with ablation today however procedures had to be delayed secondary to patient having increasing shortness of breath for the past 2 to 3 days.  Does have history of pneumonia and COPD with prior exacerbations.  Also has been reporting some chills.  EP now considering ablation versus antiarrhythmic medication.  For now as he has not taken his home medications he is hypertensive and now has RVR with heart rate in the 140s.  Cardiology will continue to follow and have started patient on diltiazem drip.  Denies fevers, chest pain, abdominal pain, constipation, diarrhea, nausea, vomiting.  Review of Systems: As per HPI otherwise all other systems reviewed and are negative.  Past Medical History:  Diagnosis Date   Acute respiratory failure with hypoxia (HCC) 08/12/2022   Atrial fibrillation (HCC)    COPD  (chronic obstructive pulmonary disease) (HCC)    COPD exacerbation (HCC) 05/17/2021   Hypertension    MI (myocardial infarction) (HCC)    Renal disorder     Past Surgical History:  Procedure Laterality Date   CARDIOVERSION N/A 08/17/2022   Procedure: CARDIOVERSION;  Surgeon: Marjo Bicker, MD;  Location: [facility];  Service: Cardiovascular;  Laterality: N/A;   KIDNEY TRANSPLANT     [facility]   KIDNEY TRANSPLANT  2011   Pt has had 2 transplants   NEPHRECTOMY TRANSPLANTED ORGAN      Social History  reports that he has been smoking cigarettes. He has been smoking an average of 1.5 packs per day. He has never used smokeless tobacco. He reports current alcohol use. He reports that he does not use drugs.  Allergies  Allergen Reactions   Atorvastatin Other (See Comments)    No family history on file.  Prior to Admission medications   Medication Sig Start Date End Date Taking? Authorizing Provider  apixaban (ELIQUIS) 5 MG TABS tablet Take 1 tablet (5 mg total) by mouth 2 (two) times daily. 09/03/22  Yes Vassie Loll, MD  diltiazem (CARDIZEM CD) 240 MG 24 hr capsule Take 1 capsule (240 mg total) by mouth daily. 09/03/22  Yes Vassie Loll, MD  fexofenadine (ALLEGRA) 180 MG tablet Take 180 mg by mouth daily as needed for allergies. 08/05/04  Yes [provider]  flunisolide (NASALIDE) 25 MCG/ACT (0.025%) SOLN Place 2 sprays into the nose 2 (two) times daily.   Yes [provider]  guaiFENesin (MUCINEX PO) Take 2 tablets by mouth every 12 (twelve) hours.   Yes [provider]  hydrALAZINE (APRESOLINE)  50 MG tablet Take 1 tablet (50 mg total) by mouth in the morning and at bedtime. 09/03/22  Yes Vassie Loll, MD  levalbuterol [facility]) 45 MCG/ACT inhaler Inhale 2 puffs into the lungs every 6 (six) hours as needed for wheezing. 09/03/22 09/03/23 Yes Vassie Loll, MD  metoprolol tartrate (LOPRESSOR) 50 MG tablet Take 1 tablet (50 mg total) by mouth 2 (two)  times daily. Patient taking differently: Take 50 mg by mouth 2 (two) times daily. Pt has been taking 3 times daily 08/19/22  Yes Johnson, Clanford L, MD  Mometasone Furoate 200 MCG/ACT AERO Inhale 2 each into the lungs in the morning and at bedtime. 03/08/22  Yes [provider]  Mycophenolate Sodium (MYCOPHENOLIC ACID) 180 MG TBEC Take 180 mg by mouth 2 (two) times daily.   Yes [provider]  pantoprazole (PROTONIX) 40 MG tablet Take 40 mg by mouth daily before supper.   Yes [provider]  potassium chloride SA (KLOR-CON M) 20 MEQ tablet Take 20 mEq by mouth once.   Yes [provider]  predniSONE (DELTASONE) 5 MG tablet Take 8 tablets daily x 2 days; 4 tablets daily x 2 days and resume daily 5 mg of prednisone. 09/03/22  Yes Vassie Loll, MD  tacrolimus (PROGRAF) 0.5 MG capsule Take 0.5 mg by mouth See admin instructions. Take 0.5 mg daily in the morning with the 1 mg capsule to equal 1.5 mg in the morning   Yes [provider]  tacrolimus (PROGRAF) 1 MG capsule Take 1 mg by mouth See admin instructions. Take 1 mg in the morning (take with 0.5 mg to equal 1.5 mg in the morning) and 2 mg in the evening   Yes [provider]  Tiotropium Bromide-Olodaterol (STIOLTO RESPIMAT) 2.5-2.5 MCG/ACT AERS Inhale 2 each into the lungs daily.   Yes [provider]  torsemide (DEMADEX) 20 MG tablet Take 20 mg by mouth as needed (fluid). 11/30/21  Yes [provider]  azaTHIOprine (IMURAN) 50 MG tablet Take 1 tablet by mouth daily. 05/11/22   [provider]    Physical Exam: There were no vitals filed for this visit.  Physical Exam Constitutional:      General: He is not in acute distress.    Appearance: Normal appearance.  HENT:     Head: Normocephalic and atraumatic.     Mouth/Throat:     Mouth: Mucous membranes are moist.     Pharynx: Oropharynx is clear.  Eyes:     Extraocular Movements: Extraocular movements intact.      Pupils: Pupils are equal, round, and reactive to light.  Cardiovascular:     Rate and Rhythm: Regular rhythm. Tachycardia present.     Pulses: Normal pulses.     Heart sounds: Normal heart sounds.  Pulmonary:     Effort: Pulmonary effort is normal. No respiratory distress.     Breath sounds: Wheezing present.  Abdominal:     General: Bowel sounds are normal. There is no distension.     Palpations: Abdomen is soft.     Tenderness: There is no abdominal tenderness.  Musculoskeletal:        General: No swelling or deformity.  Skin:    General: Skin is warm and dry.  Neurological:     General: No focal deficit present.     Mental Status: Mental status is at baseline.    Labs on Admission: I have personally reviewed following labs and imaging studies  CBC: No results for  input(s): "WBC", "NEUTROABS", "HGB", "HCT", "MCV", "PLT" in the last 168 hours.  Basic Metabolic Panel: No results for input(s): "NA", "K", "CL", "CO2", "GLUCOSE", "BUN", "CREATININE", "CALCIUM", "MG", "PHOS" in the last 168 hours.  GFR: CrCl cannot be calculated (Unknown ideal weight.).  Liver Function Tests: No results for input(s): "AST", "ALT", "ALKPHOS", "BILITOT", "PROT", "ALBUMIN" in the last 168 hours.  Urine analysis:    Component Value Date/Time   COLORURINE AMBER (A) 12/03/2016 1026   APPEARANCEUR CLEAR 12/03/2016 1026   LABSPEC 1.023 12/03/2016 1026   PHURINE 5.0 12/03/2016 1026   GLUCOSEU NEGATIVE 12/03/2016 1026   HGBUR SMALL (A) 12/03/2016 1026   BILIRUBINUR NEGATIVE 12/03/2016 1026   KETONESUR 5 (A) 12/03/2016 1026   PROTEINUR >=300 (A) 12/03/2016 1026   NITRITE NEGATIVE 12/03/2016 1026   LEUKOCYTESUR NEGATIVE 12/03/2016 1026    Radiological Exams on Admission: No results found.  EKG: Ordered in procedure area but not yet obtained and/or available for review.  Assessment/Plan Principal Problem:   Atrial fibrillation with RVR (HCC) Active Problems:   Renal transplant  recipient   HTN (hypertension)   COPD (chronic obstructive pulmonary disease) (HCC)   Atrial flutter (HCC)   Chronic respiratory failure with hypoxia (HCC)   GERD (gastroesophageal reflux disease)   CAD (coronary artery disease)   Nonexudative age-related macular degeneration   Kidney transplant failure   Anemia in chronic kidney disease   Atrial fibrillation SVT vs Afib/Flutter with RVR Dyspnea > Patient presents today for A-fib evaluation by EP and possible ablation versus antiarrhythmic. > Plan is for ablation but patient has had worsening shortness of breath the past 2 to 3 days and has abnormal lung sounds with EDP now concern for possible pneumonia versus COPD exacerbation and procedure has been deferred. > Patient started on diltiazem drip and appreciate preprocedural area with heart rates now in the 140s. - Monitor on progressive unit - Appreciate cardiology recommendations and assistance - Continue with diltiazem drip - Continue home Eliquis - Continue home metoprolol, holding home p.o. diltiazem - Consideration of antiarrhythmic versus ablation per cardiology/EP For dyspnea workup: > Known history of COPD, diastolic CHF with prior exacerbation, and prior history of pneumonia. > Wheezing on exam most suspicious for COPD etiology, however with intermittent tachycardia unclear to what extent, no new 02 requirement.  - Chest x-ray PA and lateral - CMP, CBC, magnesium - BNP, troponin - VBG - Continue home medications until possible exacerbation/acute issue has been identified and treated appropriately  COPD ?Possible exacerbation, workup ongoing - Replace home Stiolto with formulary Brovana and Incruse - Replace home mometasone with formulary Pulmicort - One-time dose of Solu-Medrol and increase prednisone to 40 mg daily - Atrovent q6h while awake - PRN albuterol - Treat COPD exacerbation as appropriate if this is determined to be etiology of dyspnea.  Chronic diastolic  CHF > On as needed torsemide.  Has had some exacerbations in the setting of A-fib with RVR. - Continue home metoprolol - BNP, chest x-ray, CMP as above - Check magnesium as above - Strict I's and O's, daily weights - Consider echocardiogram if appropriate based on initial evaluation  Chronic respiratory failure with hypoxia > Listed in chart, currently requires oxygen only at night. - Continue home supplemental oxygen nightly  History of ESRD Status post renal transplant > When creatinine was checked on 6/17 it was normal. > States that have a visit with transplant through [facility] he was told his mycophenolate might be changed to azathioprine but this has not  yet happened, possibly due to issues with getting approval through the [place]. > Chart review confirms that [facility] transplant provider intended for change from mycophenolate to azathioprine due to recurrent infections, but this has not yet been done. - CMP, magnesium as above - Trend renal function and electrolytes - Continue home mycophenolate and tacrolimus - Home prednisone held, increased dose for now for possible COPD exacerbation as above. - Will need to follow-up with outpatient provider about prescription change/approval  Hypertension - On diltiazem drip as above - Continue home hydralazine and metoprolol  CAD > Previously on statin but discontinued per chart review. - Continue home Eliquis as above - Continue home metoprolol - Continue home aspirin  GERD - Continue home PPI  Anemia - CBC  Cataracts Macular degeneration - Listed in chart but not currently on any eyedrops  DVT prophylaxis: Eliquis Code Status:   Full Family Communication:  Home Disposition Plan:   Patient is from:  Home  Anticipated DC to:  Home  Anticipated DC date:  1 to 3 days  Anticipated DC barriers: None  Consults called:  Cardiology +/-EP following Admission status:  Observation, progressive  Severity of Illness: The appropriate  patient status for this patient is OBSERVATION. Observation status is judged to be reasonable and necessary in order to provide the required intensity of service to ensure the patient's safety. The patient's presenting symptoms, physical exam findings, and initial radiographic and laboratory data in the context of their medical condition is felt to place them at decreased risk for further clinical deterioration. Furthermore, it is anticipated that the patient will be medically stable for discharge from the hospital within 2 midnights of admission.    Synetta Fail MD [facility]  How to contact the [facility] Attending or Consulting provider 7A - 7P or covering provider during after hours 7P -7A, for this patient?   Check the care team in [facility] and look for a) attending/consulting [facility] provider listed and b) the [facility] team listed Log into [email or web address] and use [place]'s universal password to access. If you do not have the password, please contact the hospital operator. Locate the [facility] provider you are looking for under [facility] and page to a number that you can be directly reached. If you still have difficulty reaching the provider, please page the [facility] (Director on Call) for the Hospitalists listed on amion for assistance.  10/28/2022, 1:06 PM

## 2022-10-28 NOTE — Telephone Encounter (Signed)
Spoke with pt who reports HR this morning 142, in Afib.  Pt states he has had more frequent episodes of Afib and elevated HR this week and not being able to take his medications this morning due to Afib ablation today with Dr Lalla Brothers.  Pt denies current CP, SOB or dizziness.  He states he is to report to the hospital by 1130am but his ride will be picking him up in about 30 minutes and they will go ahead and head to the hospital. Pt advised will forward message to make Dr Lalla Brothers aware and will contact should there be any further recommendations.

## 2022-10-28 NOTE — Anesthesia Preprocedure Evaluation (Deleted)
Anesthesia Evaluation    Reviewed: Allergy & Precautions, Patient's Chart, lab work & pertinent test results  History of Anesthesia Complications Negative for: history of anesthetic complications  Airway        Dental   Pulmonary COPD, Current Smoker and Patient abstained from smoking.          Cardiovascular hypertension, Pt. on home beta blockers and Pt. on medications + CAD and + Past MI  + dysrhythmias Atrial Fibrillation + Valvular Problems/Murmurs    '24 TTE - EF 60 to 65%. Left atrial size was severely dilated. Right atrial size was moderately dilated. Trivial mitral valve regurgitation. Visually, aortic valve regurgitation is mild but PHT measures 364 msec likely from diastolic dysfunction. Mild aortic valve stenosis. Aortic valve area, by VTI measures 1.03 cm. Aortic valve mean gradient measures 14.0 mmHg. Aortic valve Vmax measures 2.43 m/s. There is borderline dilatation of the aortic root, measuring 37 mm. Evidence of atrial level shunting detected by color flow Doppler. There is a small patent foramen ovale with predominantly left to right shunting across the atrial septum.     Neuro/Psych negative neurological ROS  negative psych ROS   GI/Hepatic Neg liver ROS,GERD  Medicated,,  Endo/Other  negative endocrine ROS    Renal/GU  S/p renal transplant      Musculoskeletal negative musculoskeletal ROS (+)    Abdominal   Peds  Hematology  On eliquis    Anesthesia Other Findings   Reproductive/Obstetrics                             Anesthesia Physical Anesthesia Plan  ASA: 3  Anesthesia Plan: General   Post-op Pain Management: Tylenol PO (pre-op)*   Induction: Intravenous  PONV Risk Score and Plan: 2 and Treatment may vary due to age or medical condition, Ondansetron and Dexamethasone  Airway Management Planned: Oral ETT  Additional Equipment: None  Intra-op Plan:    Post-operative Plan: Extubation in OR  Informed Consent:   Plan Discussed with: CRNA and Anesthesiologist  Anesthesia Plan Comments: (Case cancelled in preop by Dr. Lalla Brothers, HR 140's, pneumonia )        Anesthesia Quick Evaluation

## 2022-10-28 NOTE — H&P (Addendum)
Electrophysiology Office Note:     Date:  10/28/2022    ID:  Calvin Carroll, DOB 1956-04-18, MRN [id number]   [facility] Cardiologist:  None  [facility] Electrophysiologist:  Lanier Prude, MD    Referring MD: [facility]    Chief Complaint: Atrial fibrillation   History of Present Illness:     Calvin Carroll is a 67 y.o. male who I am seeing today for an evaluation of atrial fibrillation at the request of Dr. Diannia Ruder.  The patient receives care at the [facility].  He has a history of renal transplant, atrial fibrillation, COPD, hypertension, chronic diastolic heart failure.  According to Dr. Diannia Ruder, the patient is experienced exacerbation of his chronic diastolic heart failure while in atrial fibrillation.  Dr. Diannia Ruder discussed catheter ablation and antiarrhythmic drugs but the patient preferred to discuss catheter ablation.   His A-fib was diagnosed in April 2023 when he presented to the ER with fatigue and palpitations.  His heart rate was as high as 170 bpm.  He is on Eliquis for stroke prophylaxis.   Today he is doing well.  He does report more frequent episodes of atrial fibrillation, as many as 3 to 4/week.  They are highly symptomatic and associated with fatigue.  His heart rates are elevated during each episode.  They last for hours at a time.  He does take his Eliquis without bleeding issues.   Today, Presents for PVI. Feelign poorly with rapid heart rates and shortness of breath. Also with mucous production. ? Chills. He feels the same way he has felt previously when he has pneumonia. Continues to smoke.     Objective  Their past medical, social and family history was reveiwed.     ROS:   Please see the history of present illness.    All other systems reviewed and are negative.   EKGs/Labs/Other Studies Reviewed:     The following studies were reviewed today:   [facility]   [facility], [facility] echo EF 55 to 60%, RV normal, mild AI and AS    Today's EKG shows sinus rhythm with intermittent right bundle branch block.     Physical Exam:     VS:  BP 116/62   Pulse 62   Ht 5\' 8"  (1.727 m)   Wt 114 lb (51.7 kg)   SpO2 92%   BMI 17.33 kg/m         Wt Readings from Last 3 Encounters:  07/20/22 114 lb (51.7 kg)  05/26/21 110 lb 3.7 oz (50 kg)  05/17/21 112 lb (50.8 kg)      GEN:  Well nourished, well developed in no acute distress CARDIAC: RRR, no murmurs, rubs, gallops RESPIRATORY:  Clear to auscultation without rales, wheezing or rhonchi          Assessment ASSESSMENT AND PLAN:     1. Atrial fibrillation, unspecified type (HCC)   2. Chronic diastolic heart failure (HCC)   3. Primary hypertension   4. Chronic obstructive pulmonary disease, unspecified COPD type (HCC)   5. Tobacco abuse       #Atrial fibrillation On Eliquis for stroke prophylaxis   Discussed treatment options today for their AF including antiarrhythmic drug therapy and ablation. Discussed risks, recovery and likelihood of success. Discussed potential need for repeat ablation procedures and antiarrhythmic drugs after an initial ablation. They wish to proceed with scheduling.   Risk, benefits, and alternatives to EP study and radiofrequency ablation for afib were also  discussed in detail today. These risks include but are not limited to stroke, bleeding, vascular damage, tamponade, perforation, damage to the esophagus, lungs, and other structures, pulmonary vein stenosis, worsening renal function, and death. The patient understands these risk and wishes to proceed.  We will therefore proceed with catheter ablation at the next available time.  Carto, ICE, anesthesia are requested for the procedure.  Will also obtain CT PV protocol prior to the procedure to exclude LAA thrombus and further evaluate atrial anatomy.   Presents for PVI today. Procedure reviewed.    Today he is not a candidate for ablation. Will need admission to treat his breathing  troubles. I discussed starting Tikosyn with the patient and will plan for that pending discussions with pharmacy re: ? Drug interactions. His QTc when in sinus rhythm is acceptable.              Signed, Rossie Muskrat. Lalla Brothers, MD, [facility], [facility] 10/28/2022 Electrophysiology [facility]

## 2022-10-28 NOTE — Plan of Care (Signed)

## 2022-10-28 NOTE — Progress Notes (Signed)
Spoke to Dr. Alinda Money, agreeable to admit aptient and we will stay on as consultants for HR and rhythm management.  Eliquis dose ordered for now and d/w RN, diltiazem gtt ordered.  160/118 153/106 HR 140 Sat 94% on RA Pt in NAD  Francis Dowse, PA-C

## 2022-10-28 NOTE — Telephone Encounter (Signed)
STAT if HR is under 50 or over 120 (normal HR is 60-100 beats per minute)  What is your heart rate?   142 (while on phone)  Do you have a log of your heart rate readings (document readings)?   Do you have any other symptoms?   Patient stated he has a procedure today and he has been off his metoprolol.  Patient his HR has been increasing and wants to know if he should take medication this morning.

## 2022-10-29 ENCOUNTER — Encounter (HOSPITAL_COMMUNITY): Payer: Self-pay | Admitting: Cardiology

## 2022-10-29 DIAGNOSIS — Z79624 Long term (current) use of inhibitors of nucleotide synthesis: Secondary | ICD-10-CM | POA: Diagnosis not present

## 2022-10-29 DIAGNOSIS — Z7952 Long term (current) use of systemic steroids: Secondary | ICD-10-CM | POA: Diagnosis not present

## 2022-10-29 DIAGNOSIS — I252 Old myocardial infarction: Secondary | ICD-10-CM | POA: Diagnosis not present

## 2022-10-29 DIAGNOSIS — I110 Hypertensive heart disease with heart failure: Secondary | ICD-10-CM | POA: Diagnosis not present

## 2022-10-29 DIAGNOSIS — Z79899 Other long term (current) drug therapy: Secondary | ICD-10-CM | POA: Diagnosis not present

## 2022-10-29 DIAGNOSIS — D631 Anemia in chronic kidney disease: Secondary | ICD-10-CM | POA: Diagnosis not present

## 2022-10-29 DIAGNOSIS — F17210 Nicotine dependence, cigarettes, uncomplicated: Secondary | ICD-10-CM | POA: Diagnosis not present

## 2022-10-29 DIAGNOSIS — I4891 Unspecified atrial fibrillation: Secondary | ICD-10-CM | POA: Diagnosis present

## 2022-10-29 DIAGNOSIS — I5032 Chronic diastolic (congestive) heart failure: Secondary | ICD-10-CM | POA: Diagnosis not present

## 2022-10-29 DIAGNOSIS — I2510 Atherosclerotic heart disease of native coronary artery without angina pectoris: Secondary | ICD-10-CM | POA: Diagnosis not present

## 2022-10-29 DIAGNOSIS — J9611 Chronic respiratory failure with hypoxia: Secondary | ICD-10-CM | POA: Diagnosis not present

## 2022-10-29 DIAGNOSIS — Z7982 Long term (current) use of aspirin: Secondary | ICD-10-CM | POA: Diagnosis not present

## 2022-10-29 DIAGNOSIS — K219 Gastro-esophageal reflux disease without esophagitis: Secondary | ICD-10-CM | POA: Diagnosis not present

## 2022-10-29 DIAGNOSIS — Z79621 Long term (current) use of calcineurin inhibitor: Secondary | ICD-10-CM | POA: Diagnosis not present

## 2022-10-29 DIAGNOSIS — J441 Chronic obstructive pulmonary disease with (acute) exacerbation: Secondary | ICD-10-CM | POA: Diagnosis not present

## 2022-10-29 DIAGNOSIS — Z8701 Personal history of pneumonia (recurrent): Secondary | ICD-10-CM | POA: Diagnosis not present

## 2022-10-29 DIAGNOSIS — E871 Hypo-osmolality and hyponatremia: Secondary | ICD-10-CM | POA: Diagnosis not present

## 2022-10-29 DIAGNOSIS — Z7901 Long term (current) use of anticoagulants: Secondary | ICD-10-CM | POA: Diagnosis not present

## 2022-10-29 DIAGNOSIS — I4819 Other persistent atrial fibrillation: Secondary | ICD-10-CM | POA: Diagnosis not present

## 2022-10-29 DIAGNOSIS — T8619 Other complication of kidney transplant: Secondary | ICD-10-CM | POA: Diagnosis not present

## 2022-10-29 DIAGNOSIS — Z888 Allergy status to other drugs, medicaments and biological substances status: Secondary | ICD-10-CM | POA: Diagnosis not present

## 2022-10-29 DIAGNOSIS — I4892 Unspecified atrial flutter: Secondary | ICD-10-CM | POA: Diagnosis not present

## 2022-10-29 LAB — CBC
HCT: 37.3 % — ABNORMAL LOW (ref 39.0–52.0)
Hemoglobin: 12.1 g/dL — ABNORMAL LOW (ref 13.0–17.0)
MCH: 30.1 pg (ref 26.0–34.0)
MCHC: 32.4 g/dL (ref 30.0–36.0)
MCV: 92.8 fL (ref 80.0–100.0)
Platelets: 171 10*3/uL (ref 150–400)
RBC: 4.02 MIL/uL — ABNORMAL LOW (ref 4.22–5.81)
RDW: 16.9 % — ABNORMAL HIGH (ref 11.5–15.5)
WBC: 2.7 10*3/uL — ABNORMAL LOW (ref 4.0–10.5)
nRBC: 0 % (ref 0.0–0.2)

## 2022-10-29 LAB — COMPREHENSIVE METABOLIC PANEL
ALT: 16 U/L (ref 0–44)
AST: 28 U/L (ref 15–41)
Albumin: 2.9 g/dL — ABNORMAL LOW (ref 3.5–5.0)
Alkaline Phosphatase: 85 U/L (ref 38–126)
Anion gap: 7 (ref 5–15)
BUN: 20 mg/dL (ref 8–23)
CO2: 23 mmol/L (ref 22–32)
Calcium: 8.8 mg/dL — ABNORMAL LOW (ref 8.9–10.3)
Chloride: 100 mmol/L (ref 98–111)
Creatinine, Ser: 1.38 mg/dL — ABNORMAL HIGH (ref 0.61–1.24)
GFR, Estimated: 57 mL/min — ABNORMAL LOW (ref >60)
Glucose, Bld: 323 mg/dL — ABNORMAL HIGH (ref 70–99)
Potassium: 4.6 mmol/L (ref 3.5–5.1)
Sodium: 130 mmol/L — ABNORMAL LOW (ref 135–145)
Total Bilirubin: 0.6 mg/dL (ref 0.3–1.2)
Total Protein: 6.4 g/dL — ABNORMAL LOW (ref 6.5–8.1)

## 2022-10-29 LAB — MAGNESIUM: Magnesium: 2.5 mg/dL — ABNORMAL HIGH (ref 1.7–2.4)

## 2022-10-29 MED ORDER — GUAIFENESIN 100 MG/5ML PO LIQD
5.0000 mL | ORAL | Status: DC | PRN
  Administered 2022-10-29 – 2022-10-31 (×5): 5 mL via ORAL
  Filled 2022-10-29 (×5): qty 10

## 2022-10-29 MED ORDER — SENNA 8.6 MG PO TABS
2.0000 | ORAL_TABLET | Freq: Every evening | ORAL | Status: DC | PRN
  Administered 2022-10-29: 17.2 mg via ORAL
  Filled 2022-10-29: qty 2

## 2022-10-29 NOTE — Plan of Care (Signed)

## 2022-10-29 NOTE — Progress Notes (Signed)
Pharmacy: Dofetilide (Tikosyn) - Follow Up Assessment and Electrolyte Replacement  Pharmacy consulted to assist in monitoring and replacing electrolytes in this 67 y.o. male admitted on 10/28/2022 undergoing dofetilide initiation. First dofetilide dose: at 2100 7/5.  Per EP note, Qtc is WNL.  Labs:    Component Value Date/Time   K 4.6 10/29/2022 0221   MG 2.5 (H) 10/29/2022 0221     Plan: Potassium: K >/= 4: No additional supplementation needed  Magnesium: Mg > 2: No additional supplementation needed   Thank you for allowing pharmacy to participate in this patient's care   Romie Minus, PharmD PGY1 Pharmacy Resident  Please check AMION for all [facility] Pharmacy phone numbers After 10:00 PM, call Main Pharmacy [phone]

## 2022-10-29 NOTE — Progress Notes (Signed)
Electrophysiology Progress Note  Patient Name: Calvin Carroll. Date of Encounter: 10/29/2022  Primary Cardiologist:  Electrophysiologist: Dr. Lalla Brothers   Subjective   Feels well today -- appreciates that he is back in sinus rhythm  Inpatient Medications    Scheduled Meds:  apixaban  5 mg Oral BID   arformoterol  15 mcg Nebulization BID   And   umeclidinium bromide  1 puff Inhalation Daily   aspirin EC  81 mg Oral Daily   budesonide  0.5 mg Nebulization BID   dofetilide  250 mcg Oral BID   hydrALAZINE  50 mg Oral BID   ipratropium  0.5 mg Nebulization Q6H WA   metoprolol tartrate  50 mg Oral BID   mycophenolate  180 mg Oral BID   nicotine  14 mg Transdermal Daily   pantoprazole  40 mg Oral QAC supper   predniSONE  40 mg Oral Q breakfast   sodium chloride flush  3 mL Intravenous Q12H   sodium chloride flush  3 mL Intravenous Q12H   tacrolimus  1.5 mg Oral q AM   tacrolimus  2 mg Oral QHS   Continuous Infusions:  sodium chloride     diltiazem (CARDIZEM) infusion 12.5 mg/hr (10/28/22 2315)   PRN Meds: sodium chloride, acetaminophen **OR** acetaminophen, albuterol, polyethylene glycol, sodium chloride flush   Vital Signs    Vitals:   10/29/22 0355 10/29/22 0400 10/29/22 0753 10/29/22 0827  BP: 133/84   139/84  Pulse: 60   68  Resp: 18   (!) 25  Temp: 97.6 F (36.4 C)   97.6 F (36.4 C)  TempSrc: Oral   Oral  SpO2: 97% 96% 95% 93%  Weight: 48.2 kg     Height:        Intake/Output Summary (Last 24 hours) at 10/29/2022 0838 Last data filed at 10/28/2022 2009 Gross per 24 hour  Intake 50.24 ml  Output 200 ml  Net -149.76 ml   Filed Weights   10/28/22 1700 10/29/22 0355  Weight: 48.1 kg 48.2 kg    Physical Exam    GEN- The patient is well appearing, alert and oriented x 3 today.   Lungs- Clear to ausculation bilaterally, normal work of breathing Heart- Regular rate and rhythm, no murmurs, rubs or gallops Extremities- no clubbing, cyanosis, or  edema Neuro- strength and sensation are intact  Labs      Telemetry    Pt converted from flutter to sinus. Pt was off telemetry at the time of conversion. (personally reviewed)  Radiology    X-ray chest PA and lateral  Result Date: 10/28/2022 CLINICAL DATA:  [id number] Dyspnea [id number] EXAM: CHEST - 2 VIEW COMPARISON:  08/31/2022 FINDINGS: Mild perihilar interstitial edema or infiltrates as before, with some septal lines seen peripherally in the lung bases left greater than right . No confluent airspace disease. Heart size and mediastinal contours are within normal limits. Aortic Atherosclerosis (ICD10-170.0). No effusion. Visualized bones unremarkable. IMPRESSION: Mild perihilar interstitial edema or infiltrates. Electronically Signed   By: Corlis Leak M.D.   On: 10/28/2022 13:32     Patient Profile     Calvin Carroll. is a 67 y.o. male with a past medical history significant for COPD, tobacco abuse, AF/AFL, hx of renal transplants .    He was admitted for drug load after presenting for atrial fibrillation and being found to be a poor candidate for the procedure due to acute respiratory illness. He is now admitted for Tikosyn load.  Assessment & Plan     Persistent atrial fibrillation and flutter Ablation deferred yesterday due to subacute respiratory illness Drug options limited; he was admitted for Tikosyn load He has converted to sinus rhythm Continue Eliquis 5 mg  High risk medication load - Tikosyn First dose given last night Qtc is excellent; appears to be about 450 with correction for QRS prolongation Pharmacy assisting with electrolyte replacement - K and Mg are within goal range Careful monitoring Potential drug-drug effect with Tacrolimus Continue Tikosyn at present dose  COPD exacerbation Admitted with increased work of breathing, cough and mucus production. Care by internal medicine is appreciated  History of renal transplant On tacrolimus, mycophenolate,  prednisone Again, care by internal medicine is appreciated   York Pellant MD 10/29/2022 8:38 AM   For questions or updates, please contact [facility] Please consult [email or web address] for contact info under Cardiology/STEMI.  Signed, Maurice Small, MD  10/29/2022, 8:38 AM

## 2022-10-29 NOTE — Progress Notes (Signed)
PROGRESS NOTE    Calvin Carroll.  WUJ:[id number] DOB: 12/17/1955 DOA: 10/28/2022 [facility]CP: [facility]   Brief Narrative:  Calvin Carroll. is a 67 y.o. male with medical history significant of hypertension, atrial flutter, CAD, GERD, anemia, diastolic CHF, ESRD status post renal transplant, COPD, chronic respiratory failure with hypoxia, cataract, macular degeneration presenting today for atrial fibrillation and consideration of ablation but found to be significantly short of breath and not currently candidate for procedure.  Given symptoms patient was admitted to the hospital with cardiology consult.   Assessment & Plan:   Principal Problem:   Atrial fibrillation with RVR (HCC) Active Problems:   Renal transplant recipient   HTN (hypertension)   COPD (chronic obstructive pulmonary disease) (HCC)   Atrial flutter (HCC)   Chronic respiratory failure with hypoxia (HCC)   GERD (gastroesophageal reflux disease)   CAD (coronary artery disease)   Nonexudative age-related macular degeneration   Kidney transplant failure   Anemia in chronic kidney disease   Atrial fibrillation SVT vs Afib/Flutter with RVR Dyspnea -Patient admitted from EP procedure for possible ablation, procedures halted due to dyspnea without hypoxia -Patient transitioning to Tikosyn, will need ongoing hospitalization and close monitoring in the interim -Currently on diltiazem, Eliquis, metoprolol per cardiology/EP  Acute COPD exacerbation, POA -Likely the cause of patient's dyspnea as above -Thankfully patient is not hypoxic and has minimal symptoms comparatively, able to ambulate without hypoxia today or overt symptoms -Continue steroid taper, would likely benefit from short taper at this point -Continue home inhalers   Chronic diastolic CHF -Not likely in acute exacerbation, patient appears euvolemic -Continue torsemide, metoprolol per cardiology -Defer to cardiology for any further inpatient  testing while transitioning onto Tikosyn   Chronic nocturnal respiratory failure with hypoxia -Patient wears oxygen at night only. -Unclear if this is sleep apnea or other etiology for hypoxia   AKI History of ESRD Status post renal transplant -Creatinine baseline around 0.9, currently 1.4 -likely secondary to above A-fib/flutter with RVR -Currently on tacrolimus and mycophenolate(notes there has been discussion about changed azathioprine outpatient due to recurrent infections but he has not yet transitioned) -Follow electrolytes, renal function -Transition back to home prednisone after steroid taper with COPD as above   Hypertension - On diltiazem drip as above - Continue home hydralazine and metoprolol   CAD -Statin intolerance noted - Continue home, metoprolol, aspirin per cardiology   GERD - Continue home PPI   Anemia -Likely anemia of chronic disease given ESRD, follow repeat labs   Cataracts Macular degeneration - Listed in chart but not currently on any eyedrops  DVT prophylaxis: apixaban (ELIQUIS) tablet 5 mg  Code Status:   Code Status: Full Code Family Communication: None present  Status is: Inpatient  Dispo: The patient is from: Home              Anticipated d/c is to: Home              Anticipated d/c date is: 48 to 72 hours              Patient currently not medically stable for discharge given ongoing close monitoring required for Tikosyn initiation  Consultants:  Cardiology/EP  Procedures:  None planned  Antimicrobials:  None indicated  Subjective: No acute issues or events overnight, respiratory status seems to be improving already on steroids alone.  Patient otherwise denies chest pain nausea vomiting diarrhea constipation fevers or chills  Objective: Vitals:   10/29/22 0400 10/29/22 0753  10/29/22 0827 10/29/22 1645  BP:   139/84 134/83  Pulse:   68 (!) 34  Resp:   (!) 25 19  Temp:   97.6 F (36.4 C)   TempSrc:   Oral   SpO2: 96% 95%  93% 95%  Weight:      Height:        Intake/Output Summary (Last 24 hours) at 10/29/2022 1900 Last data filed at 10/29/2022 1500 Gross per 24 hour  Intake 276.4 ml  Output 450 ml  Net -173.6 ml   Filed Weights   10/28/22 1700 10/29/22 0355  Weight: 48.1 kg 48.2 kg    Examination:  General:  Pleasantly resting in bed, No acute distress. HEENT:  Normocephalic atraumatic.  Sclerae nonicteric, noninjected.  Extraocular movements intact bilaterally. Neck:  Without mass or deformity.  Trachea is midline. Lungs:  Clear to auscultate bilaterally without rhonchi, wheeze, or rales. Heart:  Regular rate and rhythm.  Without murmurs, rubs, or gallops. Abdomen:  Soft, nontender, nondistended.  Without guarding or rebound. Extremities: Without cyanosis, clubbing, edema, or obvious deformity. Skin:  Warm and dry, no erythema.  Data Reviewed: I have personally reviewed following labs and imaging studies  CBC: Recent Labs  Lab 10/28/22 1420 10/29/22 0221  WBC 4.2 2.7*  HGB 12.3* 12.1*  HCT 37.3* 37.3*  MCV 93.5 92.8  PLT 170 171   Basic Metabolic Panel: Recent Labs  Lab 10/28/22 1420 10/28/22 2002 10/29/22 0221  NA 137  --  130*  K 4.1  --  4.6  CL 103  --  100  CO2 23  --  23  GLUCOSE 93  --  323*  BUN 17  --  20  CREATININE 1.28*  --  1.38*  CALCIUM 9.4  --  8.8*  MG 1.6* 3.0* 2.5*   GFR: Estimated Creatinine Clearance: 36.4 mL/min (A) (by C-G formula based on SCr of 1.38 mg/dL (H)). Liver Function Tests: Recent Labs  Lab 10/28/22 1420 10/29/22 0221  AST 27 28  ALT 17 16  ALKPHOS 85 85  BILITOT 1.1 0.6  PROT 6.6 6.4*  ALBUMIN 3.3* 2.9*   Coagulation Profile: Recent Labs  Lab 10/28/22 1420  INR 1.4*   No results found for this or any previous visit (from the past 240 hour(s)).       Radiology Studies: X-ray chest PA and lateral  Result Date: 10/28/2022 CLINICAL DATA:  [id number] Dyspnea [id number] EXAM: CHEST - 2 VIEW COMPARISON:  08/31/2022 FINDINGS: Mild  perihilar interstitial edema or infiltrates as before, with some septal lines seen peripherally in the lung bases left greater than right . No confluent airspace disease. Heart size and mediastinal contours are within normal limits. Aortic Atherosclerosis (ICD10-170.0). No effusion. Visualized bones unremarkable. IMPRESSION: Mild perihilar interstitial edema or infiltrates. Electronically Signed   By: Corlis Leak M.D.   On: 10/28/2022 13:32        Scheduled Meds:  apixaban  5 mg Oral BID   arformoterol  15 mcg Nebulization BID   And   umeclidinium bromide  1 puff Inhalation Daily   aspirin EC  81 mg Oral Daily   budesonide  0.5 mg Nebulization BID   dofetilide  250 mcg Oral BID   hydrALAZINE  50 mg Oral BID   metoprolol tartrate  50 mg Oral BID   mycophenolate  180 mg Oral BID   nicotine  14 mg Transdermal Daily   pantoprazole  40 mg Oral QAC supper   predniSONE  40 mg  Oral Q breakfast   sodium chloride flush  3 mL Intravenous Q12H   sodium chloride flush  3 mL Intravenous Q12H   tacrolimus  1.5 mg Oral q AM   tacrolimus  2 mg Oral QHS   Continuous Infusions:  sodium chloride     diltiazem (CARDIZEM) infusion 12.5 mg/hr (10/29/22 1500)     LOS: 0 days    Time spent:    Azucena Fallen, DO [facility]  If 7PM-7AM, please contact night-coverage [email or web address]  10/29/2022, 7:00 PM

## 2022-10-30 ENCOUNTER — Inpatient Hospital Stay (HOSPITAL_COMMUNITY): Payer: No Typology Code available for payment source

## 2022-10-30 DIAGNOSIS — I4891 Unspecified atrial fibrillation: Secondary | ICD-10-CM | POA: Diagnosis not present

## 2022-10-30 LAB — CBC
HCT: 35.3 % — ABNORMAL LOW (ref 39.0–52.0)
Hemoglobin: 11.6 g/dL — ABNORMAL LOW (ref 13.0–17.0)
MCH: 31.1 pg (ref 26.0–34.0)
MCHC: 32.9 g/dL (ref 30.0–36.0)
MCV: 94.6 fL (ref 80.0–100.0)
Platelets: 176 10*3/uL (ref 150–400)
RBC: 3.73 MIL/uL — ABNORMAL LOW (ref 4.22–5.81)
RDW: 16.8 % — ABNORMAL HIGH (ref 11.5–15.5)
WBC: 7.9 10*3/uL (ref 4.0–10.5)
nRBC: 0 % (ref 0.0–0.2)

## 2022-10-30 LAB — BASIC METABOLIC PANEL
Anion gap: 11 (ref 5–15)
Anion gap: 6 (ref 5–15)
BUN: 30 mg/dL — ABNORMAL HIGH (ref 8–23)
BUN: 25 mg/dL — ABNORMAL HIGH (ref 8–23)
CO2: 21 mmol/L — ABNORMAL LOW (ref 22–32)
CO2: 24 mmol/L (ref 22–32)
Calcium: 8.8 mg/dL — ABNORMAL LOW (ref 8.9–10.3)
Calcium: 8.8 mg/dL — ABNORMAL LOW (ref 8.9–10.3)
Chloride: 102 mmol/L (ref 98–111)
Chloride: 102 mmol/L (ref 98–111)
Creatinine, Ser: 1.7 mg/dL — ABNORMAL HIGH (ref 0.61–1.24)
Creatinine, Ser: 1.28 mg/dL — ABNORMAL HIGH (ref 0.61–1.24)
GFR, Estimated: 44 mL/min — ABNORMAL LOW (ref >60)
GFR, Estimated: >60 mL/min (ref >60)
Glucose, Bld: 196 mg/dL — ABNORMAL HIGH (ref 70–99)
Glucose, Bld: 133 mg/dL — ABNORMAL HIGH (ref 70–99)
Potassium: 4.4 mmol/L (ref 3.5–5.1)
Potassium: 4.3 mmol/L (ref 3.5–5.1)
Sodium: 134 mmol/L — ABNORMAL LOW (ref 135–145)
Sodium: 132 mmol/L — ABNORMAL LOW (ref 135–145)

## 2022-10-30 LAB — MAGNESIUM: Magnesium: 2.1 mg/dL (ref 1.7–2.4)

## 2022-10-30 MED ORDER — ALUM & MAG HYDROXIDE-SIMETH 200-200-20 MG/5ML PO SUSP
15.0000 mL | Freq: Four times a day (QID) | ORAL | Status: DC | PRN
  Administered 2022-10-30: 15 mL via ORAL
  Filled 2022-10-30: qty 30

## 2022-10-30 MED ORDER — MAGNESIUM SULFATE 2 GM/50ML IV SOLN
2.0000 g | Freq: Once | INTRAVENOUS | Status: AC
  Administered 2022-10-30: 2 g via INTRAVENOUS
  Filled 2022-10-30: qty 50

## 2022-10-30 MED ORDER — TORSEMIDE 20 MG PO TABS
10.0000 mg | ORAL_TABLET | Freq: Every day | ORAL | Status: DC
  Administered 2022-10-30 – 2022-11-02 (×4): 10 mg via ORAL
  Filled 2022-10-30 (×4): qty 1

## 2022-10-30 MED ORDER — SODIUM CHLORIDE 0.9 % IV SOLN: INTRAVENOUS | Status: AC

## 2022-10-30 NOTE — Progress Notes (Signed)
PROGRESS NOTE    Calvin Carroll.  RUE:[id number] DOB: 10-22-1955 DOA: 10/28/2022 [facility]CP: [facility]   Brief Narrative:  Calvin Carroll. is a 67 y.o. male with medical history significant of hypertension, atrial flutter, CAD, GERD, anemia, diastolic CHF, ESRD status post renal transplant, COPD, chronic respiratory failure with hypoxia, cataract, macular degeneration presenting today for atrial fibrillation and consideration of ablation but found to be significantly short of breath and not currently candidate for procedure.  Given symptoms patient was admitted to the hospital with cardiology consult.  Assessment & Plan:   Principal Problem:   Atrial fibrillation with RVR (HCC) Active Problems:   Renal transplant recipient   HTN (hypertension)   COPD (chronic obstructive pulmonary disease) (HCC)   Atrial flutter (HCC)   Chronic respiratory failure with hypoxia (HCC)   GERD (gastroesophageal reflux disease)   CAD (coronary artery disease)   Nonexudative age-related macular degeneration   Kidney transplant failure   Anemia in chronic kidney disease   Atrial fibrillation SVT vs Afib/Flutter with RVR Dyspnea -Patient admitted from EP procedure for possible ablation, procedures halted due to dyspnea without hypoxia -Patient transitioning to Tikosyn, will need ongoing hospitalization and close monitoring in the interim -Currently on diltiazem, Eliquis, metoprolol per cardiology/EP  Mild respiratory distress without hypoxia or hypercarbia  -Procedure delayed as above due to patient's reported shortness of breath -Patient remains without hypoxia, overnight oxygen was placed on the patient with no recorded hypoxia. Patient was recently discharged 09/03/2022 on 2 L nasal cannula which has been weaned off at home over the past 2 months -Chest x-ray 10/30/2022 shows diffuse vascular congestion, resume diuretics as below   Acute COPD exacerbation, POA -Likely the cause of  patient's dyspnea as above -Thankfully patient is not hypoxic and has minimal symptoms comparatively, able to ambulate without hypoxia today or overt symptoms -Continue steroid taper, would likely benefit from short taper at this point -Continue home inhalers   Chronic diastolic CHF Questionably in early exacerbation due to medication cessation -Patient appears euvolemic but has not been on torsemide in the last few days -torsemide prescribed "10mg  as needed" per the patient to avoid dehydration - as such his use is markedly inconsistent from week to week. -Resume torsemide at 5mg  daily to avoid inconsistent "prn" use of 10mg  dosing at home -Chest x-ray as above appears somewhat congested -Metoprolol ongoing -Defer to cardiology for any further inpatient testing while transitioning onto Tikosyn   Chronic nocturnal respiratory failure with hypoxia -Patient wears oxygen at night only. -Unclear if this is sleep apnea or other etiology for hypoxia   AKI History of ESRD Status post renal transplant -Creatinine baseline around 0.9, currently 1.3 -Currently on tacrolimus and mycophenolate(notes there has been discussion about changed azathioprine outpatient due to recurrent infections but he has not yet transitioned) -Follow electrolytes, renal function -Transition back to home prednisone after steroid taper with COPD as above   Hypertension - On diltiazem drip as above - Continue home hydralazine and metoprolol per cardiology   CAD -Statin intolerance noted -Continue home, metoprolol, aspirin per cardiology   GERD - Continue home PPI   Anemia -Likely anemia of chronic disease given ESRD, follow repeat labs   Cataracts Macular degeneration - Listed in chart but not currently on any eyedrops  DVT prophylaxis: apixaban (ELIQUIS) tablet 5 mg  Code Status:   Code Status: Full Code Family Communication: None present  Status is: Inpatient  Dispo: The patient is from: Home  Anticipated d/c is to: Home              Anticipated d/c date is: 48 to 72 hours              Patient currently not medically stable for discharge given ongoing close monitoring required for Tikosyn initiation  Consultants:  Cardiology/EP  Procedures:  None planned  Antimicrobials:  None indicated  Subjective: No acute issues or events overnight, patient states increased cough this morning with no overt sputum production.  Denies any overt symptom changes but notes he feels "slightly more short of breath" but was confused that he was placed on oxygen overnight as he noted he was not hypoxic at that time(states his SpO2 was 98 on monitor)  Objective: Vitals:   10/30/22 0303 10/30/22 0400 10/30/22 0612 10/30/22 0710  BP:    129/85  Pulse:    60  Resp:    18  Temp:    97.7 F (36.5 C)  TempSrc:    Oral  SpO2: 96% 93%  98%  Weight:   49.1 kg   Height:        Intake/Output Summary (Last 24 hours) at 10/30/2022 0800 Last data filed at 10/30/2022 9562 Gross per 24 hour  Intake 276.4 ml  Output 475 ml  Net -198.6 ml    Filed Weights   10/28/22 1700 10/29/22 0355 10/30/22 0612  Weight: 48.1 kg 48.2 kg 49.1 kg    Examination:  General:  Pleasantly resting in bed, No acute distress. HEENT:  Normocephalic atraumatic.  Sclerae nonicteric, noninjected.  Extraocular movements intact bilaterally. Neck:  Without mass or deformity.  Trachea is midline. Lungs:  Clear to auscultate bilaterally without overt rhonchi, wheeze, or rales. Heart: Irregularly irregular.  Without murmurs, rubs, or gallops. Abdomen:  Soft, nontender, nondistended.  Without guarding or rebound. Extremities: Without cyanosis, clubbing, edema, or obvious deformity. Skin:  Warm and dry, no erythema.  Data Reviewed: I have personally reviewed following labs and imaging studies  CBC: Recent Labs  Lab 10/28/22 1420 10/29/22 0221 10/30/22 0218  WBC 4.2 2.7* 7.9  HGB 12.3* 12.1* 11.6*  HCT 37.3* 37.3*  35.3*  MCV 93.5 92.8 94.6  PLT 170 171 176    Basic Metabolic Panel: Recent Labs  Lab 10/28/22 1420 10/28/22 2002 10/29/22 0221 10/30/22 0218  NA 137  --  130* 134*  K 4.1  --  4.6 4.4  CL 103  --  100 102  CO2 23  --  23 21*  GLUCOSE 93  --  323* 196*  BUN 17  --  20 30*  CREATININE 1.28*  --  1.38* 1.70*  CALCIUM 9.4  --  8.8* 8.8*  MG 1.6* 3.0* 2.5* 2.1    GFR: Estimated Creatinine Clearance: 30.1 mL/min (A) (by C-G formula based on SCr of 1.7 mg/dL (H)). Liver Function Tests: Recent Labs  Lab 10/28/22 1420 10/29/22 0221  AST 27 28  ALT 17 16  ALKPHOS 85 85  BILITOT 1.1 0.6  PROT 6.6 6.4*  ALBUMIN 3.3* 2.9*    Coagulation Profile: Recent Labs  Lab 10/28/22 1420  INR 1.4*    No results found for this or any previous visit (from the past 240 hour(s)).   Radiology Studies: X-ray chest PA and lateral  Result Date: 10/28/2022 CLINICAL DATA:  [id number] Dyspnea [id number] EXAM: CHEST - 2 VIEW COMPARISON:  08/31/2022 FINDINGS: Mild perihilar interstitial edema or infiltrates as before, with some septal lines seen peripherally in the lung bases  left greater than right . No confluent airspace disease. Heart size and mediastinal contours are within normal limits. Aortic Atherosclerosis (ICD10-170.0). No effusion. Visualized bones unremarkable. IMPRESSION: Mild perihilar interstitial edema or infiltrates. Electronically Signed   By: Corlis Leak M.D.   On: 10/28/2022 13:32     Scheduled Meds:  apixaban  5 mg Oral BID   arformoterol  15 mcg Nebulization BID   And   umeclidinium bromide  1 puff Inhalation Daily   aspirin EC  81 mg Oral Daily   budesonide  0.5 mg Nebulization BID   hydrALAZINE  50 mg Oral BID   metoprolol tartrate  50 mg Oral BID   mycophenolate  180 mg Oral BID   nicotine  14 mg Transdermal Daily   pantoprazole  40 mg Oral QAC supper   predniSONE  40 mg Oral Q breakfast   sodium chloride flush  3 mL Intravenous Q12H   sodium chloride flush  3 mL  Intravenous Q12H   tacrolimus  1.5 mg Oral q AM   tacrolimus  2 mg Oral QHS   Continuous Infusions:  sodium chloride     sodium chloride     diltiazem (CARDIZEM) infusion 12.5 mg/hr (10/29/22 2130)   magnesium sulfate bolus IVPB       LOS: 1 day    Time spent:    Azucena Fallen, DO [facility]  If 7PM-7AM, please contact night-coverage [email or web address]  10/30/2022, 8:00 AM

## 2022-10-30 NOTE — Progress Notes (Addendum)
Electrophysiology Progress Note  Patient Name: Calvin Carroll. Date of Encounter: 10/30/2022  Primary Cardiologist:  Electrophysiologist: Dr. Lalla Brothers   Subjective   Having increased shortness of breath, wheezing.  Inpatient Medications    Scheduled Meds:  apixaban  5 mg Oral BID   arformoterol  15 mcg Nebulization BID   And   umeclidinium bromide  1 puff Inhalation Daily   aspirin EC  81 mg Oral Daily   budesonide  0.5 mg Nebulization BID   dofetilide  250 mcg Oral BID   hydrALAZINE  50 mg Oral BID   metoprolol tartrate  50 mg Oral BID   mycophenolate  180 mg Oral BID   nicotine  14 mg Transdermal Daily   pantoprazole  40 mg Oral QAC supper   predniSONE  40 mg Oral Q breakfast   sodium chloride flush  3 mL Intravenous Q12H   sodium chloride flush  3 mL Intravenous Q12H   tacrolimus  1.5 mg Oral q AM   tacrolimus  2 mg Oral QHS   Continuous Infusions:  sodium chloride     diltiazem (CARDIZEM) infusion 12.5 mg/hr (10/29/22 2130)   PRN Meds: sodium chloride, acetaminophen **OR** acetaminophen, albuterol, alum & mag hydroxide-simeth, guaiFENesin, polyethylene glycol, senna, sodium chloride flush   Vital Signs    Vitals:   10/30/22 0303 10/30/22 0400 10/30/22 0612 10/30/22 0710  BP:    129/85  Pulse:    60  Resp:    18  Temp:    97.7 F (36.5 C)  TempSrc:    Oral  SpO2: 96% 93%  98%  Weight:   49.1 kg   Height:        Intake/Output Summary (Last 24 hours) at 10/30/2022 0722 Last data filed at 10/30/2022 9604 Gross per 24 hour  Intake 276.4 ml  Output 475 ml  Net -198.6 ml   Filed Weights   10/28/22 1700 10/29/22 0355 10/30/22 0612  Weight: 48.1 kg 48.2 kg 49.1 kg    Physical Exam    GEN- The patient is well appearing, alert and oriented x 3 today.   Lungs- + diffuse wheezing Heart- Regular rate and rhythm, no murmurs, rubs or gallops Extremities- no clubbing, cyanosis, or edema Neuro- strength and sensation are intact  Labs      Telemetry     Sinus rhythm. Single dropped P wave yesterday between 5 and 6 PM. (personally reviewed)  Radiology    X-ray chest PA and lateral  Result Date: 10/28/2022 CLINICAL DATA:  [id number] Dyspnea [id number] EXAM: CHEST - 2 VIEW COMPARISON:  08/31/2022 FINDINGS: Mild perihilar interstitial edema or infiltrates as before, with some septal lines seen peripherally in the lung bases left greater than right . No confluent airspace disease. Heart size and mediastinal contours are within normal limits. Aortic Atherosclerosis (ICD10-170.0). No effusion. Visualized bones unremarkable. IMPRESSION: Mild perihilar interstitial edema or infiltrates. Electronically Signed   By: Corlis Leak M.D.   On: 10/28/2022 13:32     Patient Profile     Calvin Carroll. is a 67 y.o. male with a past medical history significant for COPD, tobacco abuse, AF/AFL, hx of renal transplants .    He was admitted for drug load after presenting for atrial fibrillation and being found to be a poor candidate for the procedure due to acute respiratory illness. He is now admitted for Tikosyn load.  Assessment & Plan     Persistent atrial fibrillation and flutter Ablation deferred 7/5 due  to subacute respiratory illness Drug options limited; he was admitted for Tikosyn load He has converted to sinus rhythm Continue Eliquis 5 mg  High risk medication load - Tikosyn First dose given 7/5 PM Qtc prolonged yesterday -- PM dose was given with QT > 500 Pharmacy assisting with electrolyte replacement - K and Mg are within goal range Potential drug-drug effect with Tacrolimus I am going to discontinue Tikosyn load due to AKI; I don't think we will able to determine a safe and reliable dose.  QT PROLONGATION avoid QT prolonging medications Replace magnesium   AKI Cr 1.28 -->1.7 this admission Holding Tikosyn. In the absence of steady state renal function, I do not think Tikosyn will be safe to continue at this time.  COPD  exacerbation Admitted with increased work of breathing, cough and mucus production. Increased shortness of breath and congestion this AM Repeat chest x-ray Nebulizers, prednisone ordered Care by internal medicine is appreciated  History of renal transplant On tacrolimus, mycophenolate, prednisone Again, care by internal medicine is appreciated   York Pellant MD 10/30/2022 7:22 AM   For questions or updates, please contact [facility] Please consult [email or web address] for contact info under Cardiology/STEMI.  Signed, Maurice Small, MD  10/30/2022, 7:22 AM

## 2022-10-31 ENCOUNTER — Other Ambulatory Visit: Payer: No Typology Code available for payment source

## 2022-10-31 ENCOUNTER — Other Ambulatory Visit (HOSPITAL_COMMUNITY): Payer: Self-pay

## 2022-10-31 DIAGNOSIS — I4891 Unspecified atrial fibrillation: Secondary | ICD-10-CM | POA: Diagnosis not present

## 2022-10-31 LAB — BASIC METABOLIC PANEL
Anion gap: 8 (ref 5–15)
BUN: 24 mg/dL — ABNORMAL HIGH (ref 8–23)
CO2: 24 mmol/L (ref 22–32)
Calcium: 8.5 mg/dL — ABNORMAL LOW (ref 8.9–10.3)
Chloride: 101 mmol/L (ref 98–111)
Creatinine, Ser: 1.13 mg/dL (ref 0.61–1.24)
GFR, Estimated: >60 mL/min (ref >60)
Glucose, Bld: 141 mg/dL — ABNORMAL HIGH (ref 70–99)
Potassium: 4.1 mmol/L (ref 3.5–5.1)
Sodium: 133 mmol/L — ABNORMAL LOW (ref 135–145)

## 2022-10-31 LAB — CBC
HCT: 36.5 % — ABNORMAL LOW (ref 39.0–52.0)
Hemoglobin: 11.9 g/dL — ABNORMAL LOW (ref 13.0–17.0)
MCH: 31.4 pg (ref 26.0–34.0)
MCHC: 32.6 g/dL (ref 30.0–36.0)
MCV: 96.3 fL (ref 80.0–100.0)
Platelets: 178 10*3/uL (ref 150–400)
RBC: 3.79 MIL/uL — ABNORMAL LOW (ref 4.22–5.81)
RDW: 16.7 % — ABNORMAL HIGH (ref 11.5–15.5)
WBC: 6.6 10*3/uL (ref 4.0–10.5)
nRBC: 0 % (ref 0.0–0.2)

## 2022-10-31 LAB — MAGNESIUM: Magnesium: 2.1 mg/dL (ref 1.7–2.4)

## 2022-10-31 MED ORDER — PREDNISONE 10 MG PO TABS: 10.0000 mg | ORAL_TABLET | Freq: Every day | ORAL | Status: DC

## 2022-10-31 MED ORDER — DILTIAZEM HCL ER COATED BEADS 240 MG PO CP24: 240.0000 mg | ORAL_CAPSULE | Freq: Every day | ORAL | Status: DC

## 2022-10-31 MED ORDER — PREDNISONE 10 MG PO TABS
10.0000 mg | ORAL_TABLET | Freq: Two times a day (BID) | ORAL | Status: DC
  Administered 2022-11-01 – 2022-11-02 (×3): 10 mg via ORAL
  Filled 2022-10-31 (×3): qty 1

## 2022-10-31 MED ORDER — DILTIAZEM HCL 60 MG PO TABS: 60.0000 mg | ORAL_TABLET | Freq: Three times a day (TID) | ORAL | Status: DC

## 2022-10-31 MED ORDER — DILTIAZEM HCL 60 MG PO TABS
90.0000 mg | ORAL_TABLET | Freq: Three times a day (TID) | ORAL | Status: AC
  Administered 2022-10-31 – 2022-11-01 (×5): 90 mg via ORAL
  Filled 2022-10-31 (×5): qty 1

## 2022-10-31 MED ORDER — DILTIAZEM HCL 60 MG PO TABS: 90.0000 mg | ORAL_TABLET | Freq: Three times a day (TID) | ORAL | Status: DC

## 2022-10-31 NOTE — Progress Notes (Addendum)
Patient Name: Calvin Carroll. Date of Encounter: 10/31/2022  Primary Cardiologist: None Electrophysiologist: Lanier Prude, MD  Interval Summary   The patient is doing well today.  Reports he has had multiple admissions in the past 6 months for "pneumonia".  At this time, the patient denies chest pain, shortness of breath, or any new concerns.  Inpatient Medications    Scheduled Meds:  apixaban  5 mg Oral BID   arformoterol  15 mcg Nebulization BID   And   umeclidinium bromide  1 puff Inhalation Daily   aspirin EC  81 mg Oral Daily   budesonide  0.5 mg Nebulization BID   hydrALAZINE  50 mg Oral BID   metoprolol tartrate  50 mg Oral BID   mycophenolate  180 mg Oral BID   nicotine  14 mg Transdermal Daily   pantoprazole  40 mg Oral QAC supper   predniSONE  40 mg Oral Q breakfast   sodium chloride flush  3 mL Intravenous Q12H   sodium chloride flush  3 mL Intravenous Q12H   tacrolimus  1.5 mg Oral q AM   tacrolimus  2 mg Oral QHS   torsemide  10 mg Oral Daily   Continuous Infusions:  sodium chloride     diltiazem (CARDIZEM) infusion 12.5 mg/hr (10/31/22 0830)   PRN Meds: sodium chloride, acetaminophen **OR** acetaminophen, albuterol, alum & mag hydroxide-simeth, guaiFENesin, polyethylene glycol, senna, sodium chloride flush   Vital Signs    Vitals:   10/30/22 1911 10/30/22 1915 10/30/22 1950 10/31/22 0456  BP:   137/82 138/84  Pulse: 79   77  Resp: 16  (!) 22 18  Temp:   98.2 F (36.8 C) 97.6 F (36.4 C)  TempSrc:   Oral Oral  SpO2: 97% 96% 94% 94%  Weight:    49 kg  Height:        Intake/Output Summary (Last 24 hours) at 10/31/2022 0845 Last data filed at 10/31/2022 0500 Gross per 24 hour  Intake --  Output 2350 ml  Net -2350 ml   Filed Weights   10/29/22 0355 10/30/22 0612 10/31/22 0456  Weight: 48.2 kg 49.1 kg 49 kg    Physical Exam    GEN- The patient is well appearing, alert and oriented x 3 today.   Lungs- Clear to ausculation bilaterally,  normal work of breathing Cardiac- Regular rate and rhythm, no murmurs, rubs or gallops GI- soft, NT, ND, + BS Extremities- no clubbing or cyanosis. No edema  Telemetry    SR 70-80, occasional PVC's (personally reviewed)  Hospital Course    Calvin Decardenas. is a 67 y.o. male  with a past medical history significant for COPD, tobacco abuse, AF/AFL, hx of renal transplants on tacrolimus.     He was admitted for drug load after presenting for atrial fibrillation and being found to be a poor candidate for the procedure due to acute respiratory illness. He was admitted for Tikosyn load but due to AKI and QTc prolongation, this was stopped and he was started on Cardizem.  Assessment & Plan    Persistent Atrial Fibrillation & Flutter  Ablation deferred 7/5 due to subacute respiratory illness  -limited drug options, admitted for Tikosyn load  -converted to SR -continue eliquis 5mg  BID, appropriate dosing reviewed  -begin cardizem 60 mg Q8, plan to consolidate closer to discharge to CD -stop cardizem infusion at 1200   High Risk Medication Load - Tikosyn  First dose 7/5, QTc prolonged 7/6 &  PM dose was given with QTc >500.  -potential drug-drug interaction with Tacrolimus  -Tikosyn held on 7/7 in setting of AKI  QT Prolongation  -avoid QT prolonging agents  -follow K/Mg+   AKI  Baseline Cr ~ 0.7 to 1.2 but frequent episodes of AKI in last year, increased from 1.2 > 1.7 this admit.  -Tikosyn stopped 7/7, not likely to be able to resume       For questions or updates, please contact [facility] Please consult [email or web address] for contact info under Cardiology/STEMI.  Signed, Canary Brim, MSN, APRN, NP-C, AGACNP-BC [facility] - Electrophysiology  10/31/2022, 8:45 AM

## 2022-10-31 NOTE — Progress Notes (Signed)
PROGRESS NOTE    Calvin Carroll.  ZOX:[id number] DOB: 27-Jul-1955 DOA: 10/28/2022 [facility]CP: [facility]   Brief Narrative:  Calvin Carroll. is a 67 y.o. male with medical history significant of hypertension, atrial flutter, CAD, GERD, anemia, diastolic CHF, ESRD status post renal transplant, COPD, chronic respiratory failure with hypoxia, cataract, macular degeneration presenting today for atrial fibrillation and consideration of ablation but found to be significantly short of breath and not currently candidate for procedure.  Given symptoms patient was admitted to the hospital with cardiology consult.  Assessment & Plan:   Principal Problem:   Atrial fibrillation with RVR (HCC) Active Problems:   Renal transplant recipient   HTN (hypertension)   COPD (chronic obstructive pulmonary disease) (HCC)   Atrial flutter (HCC)   Chronic respiratory failure with hypoxia (HCC)   GERD (gastroesophageal reflux disease)   CAD (coronary artery disease)   Nonexudative age-related macular degeneration   Kidney transplant failure   Anemia in chronic kidney disease   Atrial fibrillation SVT vs Afib/Flutter with RVR Dyspnea -Patient admitted from EP procedure for possible ablation, procedures halted due to dyspnea without hypoxia -Patient unfortunately failed tykosin transition - cardiology to discuss further modalities for rate control - off dilt gtt today --> advance to PO per their recommendations. Pending cardiology clearance/heart rate control can likely DC in 24-48h.  Mild respiratory distress without hypoxia or hypercarbia, resolved -Procedure delayed as above due to patient's reported shortness of breath -Patient remains without hypoxia, overnight oxygen was placed on the patient with no recorded hypoxia. Patient was recently discharged 09/03/2022 on 2 L nasal cannula which has been weaned off at home over the past 2 months -Chest x-ray 10/30/2022 shows diffuse vascular  congestion, resume diuretics as below  Acute COPD exacerbation, POA -Likely the cause of patient's dyspnea as above -Thankfully patient is not hypoxic and has minimal symptoms comparatively, able to ambulate without hypoxia today or overt symptoms -Continue steroid taper, would likely benefit from short taper at this point -Continue home inhalers   Chronic diastolic CHF Questionably in early exacerbation due to medication cessation -Patient appears euvolemic but has not been on torsemide in the last few days -torsemide prescribed "10mg  as needed" per the patient to avoid dehydration - as such his use is markedly inconsistent from week to week. -Resume torsemide at 5mg  daily to avoid inconsistent "prn" use of 10mg  dosing at home -Chest x-ray as above appears somewhat congested -Metoprolol ongoing -Defer to cardiology for any further inpatient testing while transitioning onto Tikosyn   Chronic nocturnal respiratory failure with hypoxia -Patient wears oxygen at night only. -Unclear if this is sleep apnea or other etiology for hypoxia   AKI History of ESRD Status post renal transplant -Creatinine baseline around 0.9, currently 1.1 -Currently on tacrolimus and mycophenolate(notes there has been discussion about changed azathioprine outpatient due to recurrent infections but he has not yet transitioned) -Follow electrolytes, renal function -Transition back to home prednisone after steroid taper with COPD as above   Hypertension - Diltiazem drip transitioning to PO - Continue home hydralazine and metoprolol per cardiology   CAD -Statin intolerance noted -Continue home, metoprolol, aspirin per cardiology   GERD - Continue home PPI   Anemia -Likely anemia of chronic disease given ESRD, follow repeat labs   Cataracts Macular degeneration - Listed in chart but not currently on any eyedrops  DVT prophylaxis: apixaban (ELIQUIS) tablet 5 mg  Code Status:   Code Status: Full  Code Family Communication: None  present  Status is: Inpatient  Dispo: The patient is from: Home              Anticipated d/c is to: Home              Anticipated d/c date is: 48 to 72 hours              Patient currently not medically stable for discharge given ongoing close monitoring required for Tikosyn initiation  Consultants:  Cardiology/EP  Procedures:  None planned  Antimicrobials:  None indicated  Subjective: No acute issues or events overnight, patient states increased cough this morning with no overt sputum production.  Denies any overt symptom changes but notes he feels "slightly more short of breath" but was confused that he was placed on oxygen overnight as he noted he was not hypoxic at that time(states his SpO2 was 98 on monitor)  Objective: Vitals:   10/30/22 1911 10/30/22 1915 10/30/22 1950 10/31/22 0456  BP:   137/82 138/84  Pulse: 79   77  Resp: 16  (!) 22 18  Temp:   98.2 F (36.8 C) 97.6 F (36.4 C)  TempSrc:   Oral Oral  SpO2: 97% 96% 94% 94%  Weight:    49 kg  Height:        Intake/Output Summary (Last 24 hours) at 10/31/2022 0828 Last data filed at 10/31/2022 0500 Gross per 24 hour  Intake --  Output 2350 ml  Net -2350 ml    Filed Weights   10/29/22 0355 10/30/22 0612 10/31/22 0456  Weight: 48.2 kg 49.1 kg 49 kg    Examination:  General:  Pleasantly resting in bed, No acute distress. HEENT:  Normocephalic atraumatic.  Sclerae nonicteric, noninjected.  Extraocular movements intact bilaterally. Neck:  Without mass or deformity.  Trachea is midline. Lungs:  Clear to auscultate bilaterally without overt rhonchi, wheeze, or rales. Heart: Irregularly irregular.  Without murmurs, rubs, or gallops. Abdomen:  Soft, nontender, nondistended.  Without guarding or rebound. Extremities: Without cyanosis, clubbing, edema, or obvious deformity. Skin:  Warm and dry, no erythema.  Data Reviewed: I have personally reviewed following labs and imaging  studies  CBC: Recent Labs  Lab 10/28/22 1420 10/29/22 0221 10/30/22 0218 10/31/22 0252  WBC 4.2 2.7* 7.9 6.6  HGB 12.3* 12.1* 11.6* 11.9*  HCT 37.3* 37.3* 35.3* 36.5*  MCV 93.5 92.8 94.6 96.3  PLT 170 171 176 178    Basic Metabolic Panel: Recent Labs  Lab 10/28/22 1420 10/28/22 2002 10/29/22 0221 10/30/22 0218 10/30/22 1156 10/31/22 0252  NA 137  --  130* 134* 132* 133*  K 4.1  --  4.6 4.4 4.3 4.1  CL 103  --  100 102 102 101  CO2 23  --  23 21* 24 24  GLUCOSE 93  --  323* 196* 133* 141*  BUN 17  --  20 30* 25* 24*  CREATININE 1.28*  --  1.38* 1.70* 1.28* 1.13  CALCIUM 9.4  --  8.8* 8.8* 8.8* 8.5*  MG 1.6* 3.0* 2.5* 2.1  --  2.1    GFR: Estimated Creatinine Clearance: 45.2 mL/min (by C-G formula based on SCr of 1.13 mg/dL). Liver Function Tests: Recent Labs  Lab 10/28/22 1420 10/29/22 0221  AST 27 28  ALT 17 16  ALKPHOS 85 85  BILITOT 1.1 0.6  PROT 6.6 6.4*  ALBUMIN 3.3* 2.9*    Coagulation Profile: Recent Labs  Lab 10/28/22 1420  INR 1.4*    No results found  for this or any previous visit (from the past 240 hour(s)).   Radiology Studies: DG Chest 2 View  Result Date: 10/30/2022 CLINICAL DATA:  Shortness of breath. EXAM: CHEST - 2 VIEW COMPARISON:  10/28/2022 FINDINGS: The cardiopericardial silhouette is within normal limits for size. Underlying chronic interstitial changes again noted with some slight increase in parahilar interstitial opacity on the current study. Tiny bilateral pleural effusions. Telemetry leads overlie the chest. IMPRESSION: 1. Chronic interstitial changes with slight increase in parahilar interstitial opacity since previous study suggesting mild interstitial pulmonary edema. 2. Tiny bilateral pleural effusions. Electronically Signed   By: Kennith Center M.D.   On: 10/30/2022 10:46     Scheduled Meds:  apixaban  5 mg Oral BID   arformoterol  15 mcg Nebulization BID   And   umeclidinium bromide  1 puff Inhalation Daily   aspirin  EC  81 mg Oral Daily   budesonide  0.5 mg Nebulization BID   hydrALAZINE  50 mg Oral BID   metoprolol tartrate  50 mg Oral BID   mycophenolate  180 mg Oral BID   nicotine  14 mg Transdermal Daily   pantoprazole  40 mg Oral QAC supper   predniSONE  40 mg Oral Q breakfast   sodium chloride flush  3 mL Intravenous Q12H   sodium chloride flush  3 mL Intravenous Q12H   tacrolimus  1.5 mg Oral q AM   tacrolimus  2 mg Oral QHS   torsemide  10 mg Oral Daily   Continuous Infusions:  sodium chloride     diltiazem (CARDIZEM) infusion 12.5 mg/hr (10/30/22 2115)     LOS: 2 days    Time spent:    Azucena Fallen, DO [facility]  If 7PM-7AM, please contact night-coverage [email or web address]  10/31/2022, 8:28 AM

## 2022-10-31 NOTE — Care Management (Signed)
Per pharmacy, Calvin Carroll is covered by [employer].

## 2022-10-31 NOTE — TOC Initial Note (Signed)
Transition of Care (TOC) - Initial/Assessment Note    Patient Details  Name: Calvin Carroll. MRN: [id number] Date of Birth: 1955/09/29  Transition of Care [facility]) CM/SW Contact:    Gala Lewandowsky, RN Phone Number: 10/31/2022, 4:25 PM  Clinical Narrative: Risk for readmission assessment completed. Patient presented for  A-Fib. PTA patient states he was from home with significant other. Case Manager called the [facility] to make them  aware of  hospitalization. Patient has PCP [facility] and the CSW is Edilia Bo at [phone] ext [phone]. Case Manager will continue to follow for transition of care needs.             Expected Discharge Plan: Home/Self Care Barriers to Discharge: Continued Medical Work up   Patient Goals and CMS Choice     Choice offered to / list presented to : NA   Expected Discharge Plan and Services In-house Referral: NA Discharge Planning Services: CM Consult Post Acute Care Choice: NA Living arrangements for the past 2 months: Single Family Home                   DME Agency: NA       HH Arranged: NA   Prior Living Arrangements/Services Living arrangements for the past 2 months: Single Family Home Lives with:: Significant Other Patient language and need for interpreter reviewed:: Yes Do you feel safe going back to the place where you live?: Yes      Need for Family Participation in Patient Care: Yes (Comment) Care giver support system in place?: Yes (comment)   Criminal Activity/Legal Involvement Pertinent to Current Situation/Hospitalization: No - Comment as needed  Activities of Daily Living Home Assistive Devices/Equipment: None ADL Screening (condition at time of admission) Patient's cognitive ability adequate to safely complete daily activities?: Yes Is the patient deaf or have difficulty hearing?: Yes Does the patient have difficulty seeing, even when wearing glasses/contacts?: No Does the patient have difficulty  concentrating, remembering, or making decisions?: No Patient able to express need for assistance with ADLs?: Yes Does the patient have difficulty dressing or bathing?: No Independently performs ADLs?: Yes (appropriate for developmental age) Does the patient have difficulty walking or climbing stairs?: No Weakness of Legs: None Weakness of Arms/Hands: None  Permission Sought/Granted Permission sought to share information with : Family Supports, Case Manager Permission granted to share information with : Yes, Verbal Permission Granted     Emotional Assessment Appearance:: Appears stated age Attitude/Demeanor/Rapport: Engaged Affect (typically observed): Appropriate Orientation: : Oriented to Self, Oriented to Place, Oriented to  Time, Oriented to Situation Alcohol / Substance Use: Not Applicable Psych Involvement: No (comment)  Admission diagnosis:  Atrial fibrillation with RVR (HCC) [I48.91] Patient Active Problem List   Diagnosis Date Noted   Cataract 10/28/2022   Nonexudative age-related macular degeneration 10/28/2022   End-stage renal disease (HCC) 10/28/2022   Chronic kidney disease 10/28/2022   Anemia in chronic kidney disease 10/28/2022   Chronic respiratory failure with hypoxia (HCC) 09/01/2022   GERD (gastroesophageal reflux disease) 09/01/2022   CAD (coronary artery disease) 09/01/2022   Hypercoagulable state due to persistent atrial fibrillation (HCC) 08/22/2022   Atrial flutter (HCC) 08/18/2022   Shortness of breath 08/14/2022   Lobar pneumonia (HCC) 08/13/2022   COPD (chronic obstructive pulmonary disease) (HCC) 08/13/2022   Atrial fibrillation with RVR (HCC) 05/18/2021   Renal transplant recipient 05/17/2021   HTN (hypertension) 05/17/2021   COVID-19 virus infection 05/17/2021   Kidney transplant failure 11/15/2017   PCP:  [facility] Pharmacy:   [facility] - [place], [place] - [address] [place] [address] Phone: [phone] Fax: [phone]  [facility] [address], [place] - [place] [address] [place] [address] [place] [address] Phone: [phone] Fax: [phone] Social Determinants of Health (SDOH) Social History: SDOH Screenings   Food Insecurity: No Food Insecurity (10/29/2022)  Housing: Patient Declined (10/29/2022)  Transportation Needs: No Transportation Needs (10/29/2022)  Utilities: Not At Risk (10/29/2022)  Tobacco Use: High Risk (10/29/2022)   Readmission Risk Interventions    10/31/2022    4:08 PM 08/17/2022   11:20 AM  Readmission Risk Prevention Plan  Transportation Screening Complete Complete  Home Care Screening  Complete  Medication Review (RN CM)  Complete  HRI or Home Care Consult Complete   Social Work Consult for Recovery Care Planning/Counseling Complete   Palliative Care Screening Not Applicable   Medication Review Oceanographer) Referral to Pharmacy

## 2022-11-01 DIAGNOSIS — I4891 Unspecified atrial fibrillation: Secondary | ICD-10-CM | POA: Diagnosis not present

## 2022-11-01 LAB — CBC
HCT: 38.6 % — ABNORMAL LOW (ref 39.0–52.0)
Hemoglobin: 12.5 g/dL — ABNORMAL LOW (ref 13.0–17.0)
MCH: 30.5 pg (ref 26.0–34.0)
MCHC: 32.4 g/dL (ref 30.0–36.0)
MCV: 94.1 fL (ref 80.0–100.0)
Platelets: 188 10*3/uL (ref 150–400)
RBC: 4.1 MIL/uL — ABNORMAL LOW (ref 4.22–5.81)
RDW: 16.3 % — ABNORMAL HIGH (ref 11.5–15.5)
WBC: 7.1 10*3/uL (ref 4.0–10.5)
nRBC: 0 % (ref 0.0–0.2)

## 2022-11-01 IMAGING — CT CT ANGIO CHEST
2 of 6 series · 18 of 36 positions shown · IV contrast (Omnipaque or Isovue)
Comparison: CT chest angio dated March 11, 2017

CLINICAL DATA: Concern for pulmonary embolus

EXAM:
CT ANGIOGRAPHY CHEST WITH CONTRAST
TECHNIQUE: Multidetector CT imaging of the chest was performed using the
standard protocol during bolus administration of intravenous
contrast. Multiplanar CT image reconstructions and MIPs were
obtained to evaluate the vascular anatomy.

[Series 5: pe axial thins · axial · 0.68mm/px · z∈[+1176,+1486]mm · 17 of 430 slices shown]
[im 21/430  lung]
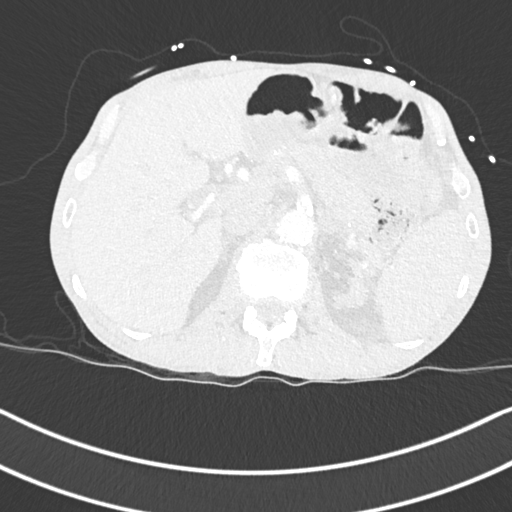
[im 41/430  mediastinal]
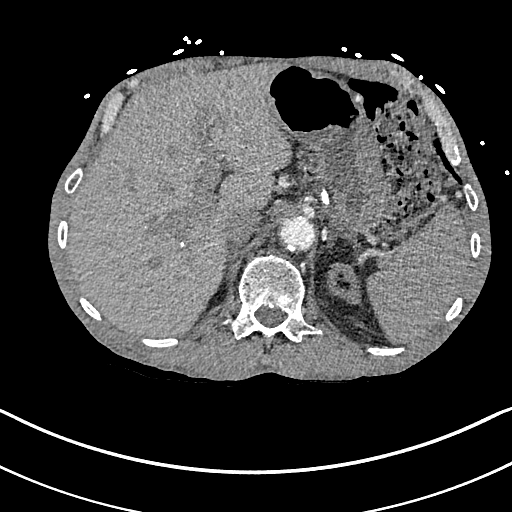
[im 62/430  lung]
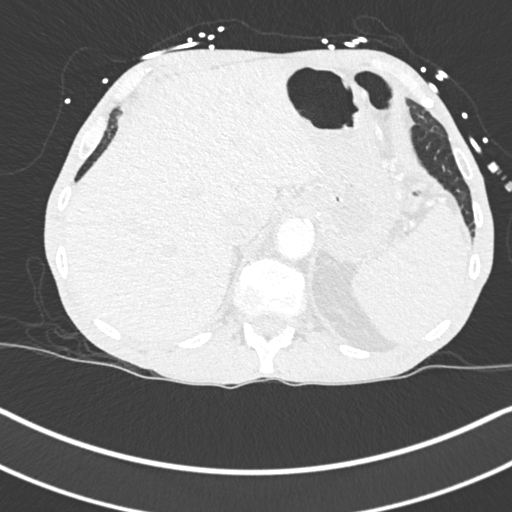
[im 103/430  mediastinal]
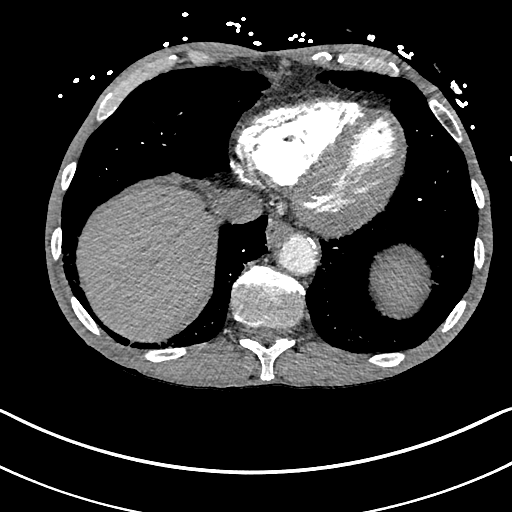
[im 123/430  lung]
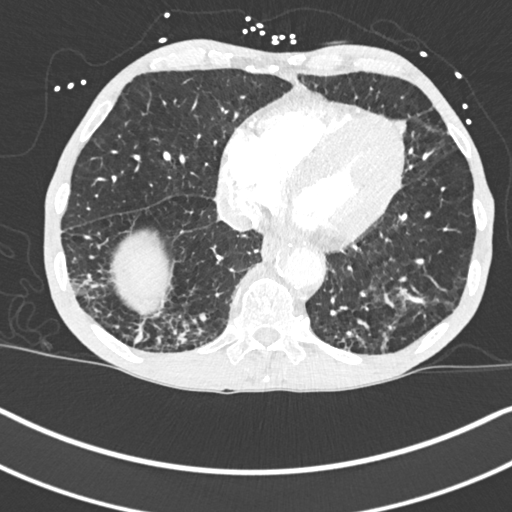
[im 144/430  mediastinal]
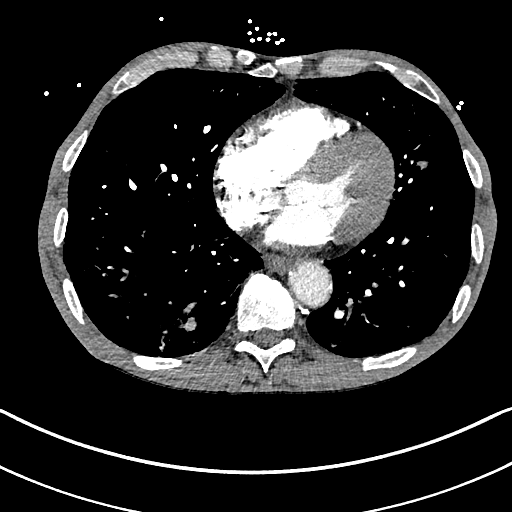
[im 164/430  lung]
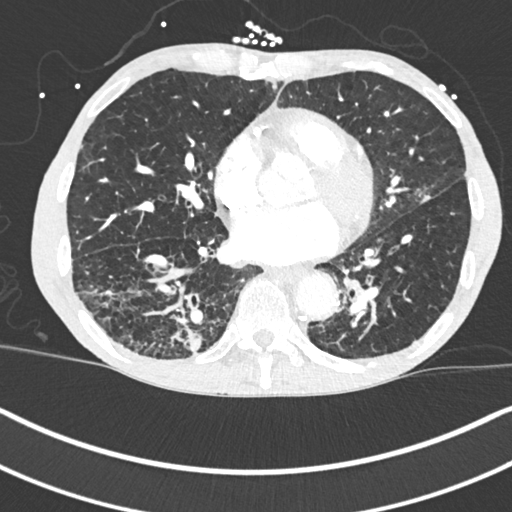
[im 184/430  mediastinal]
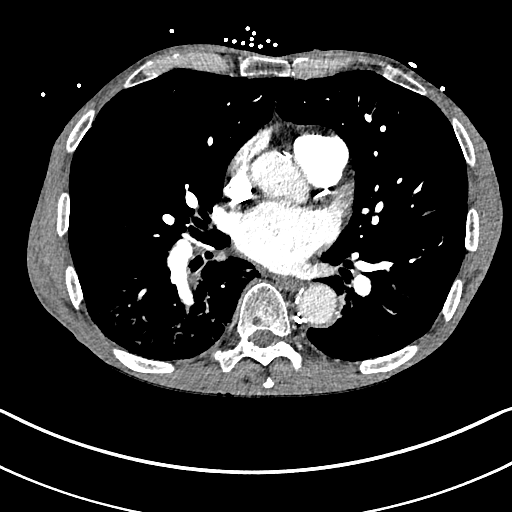
[im 225/430  lung]
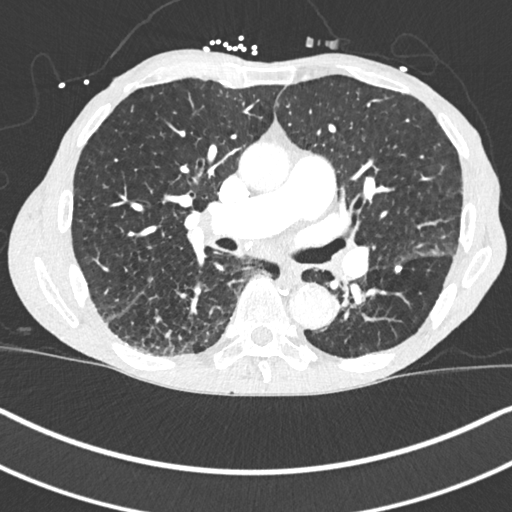
[im 246/430  mediastinal]
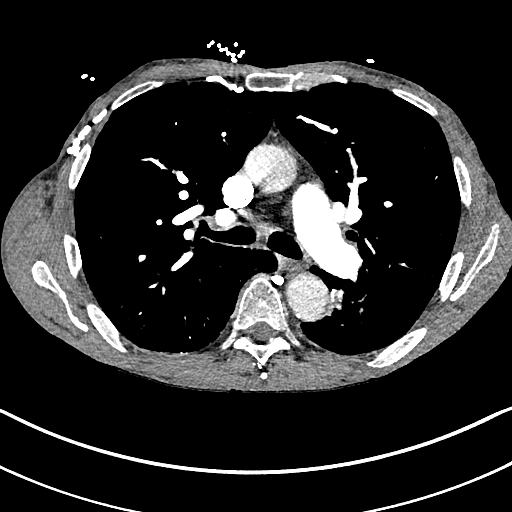
[im 266/430  lung]
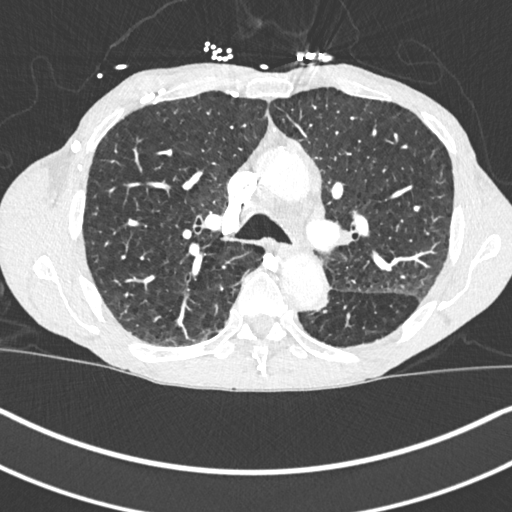
[im 287/430  mediastinal]
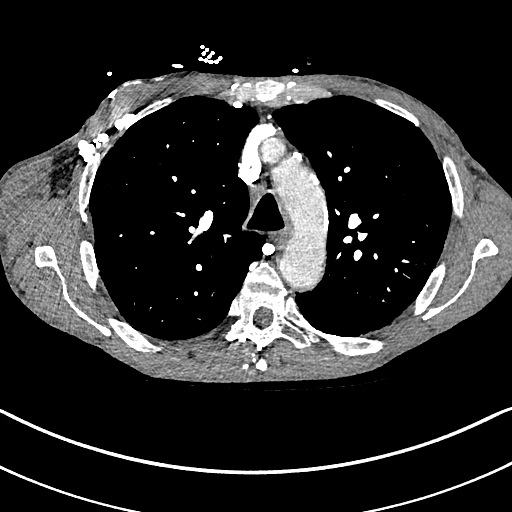
[im 307/430  lung]
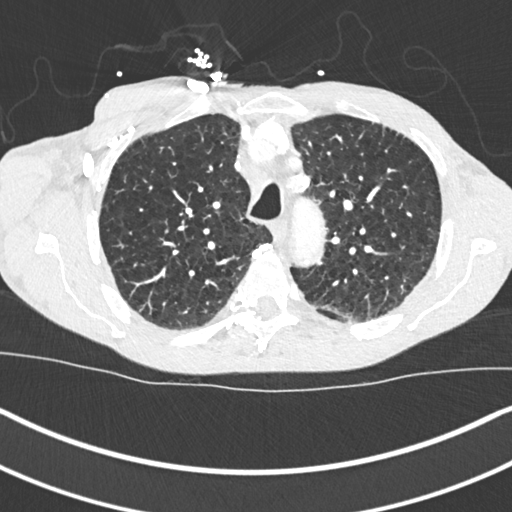
[im 327/430  mediastinal]
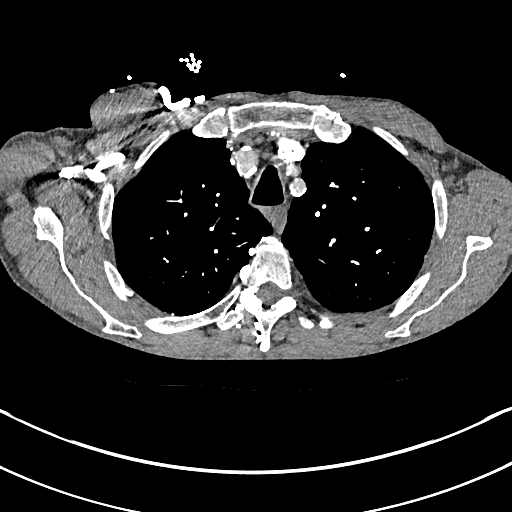
[im 368/430  lung]
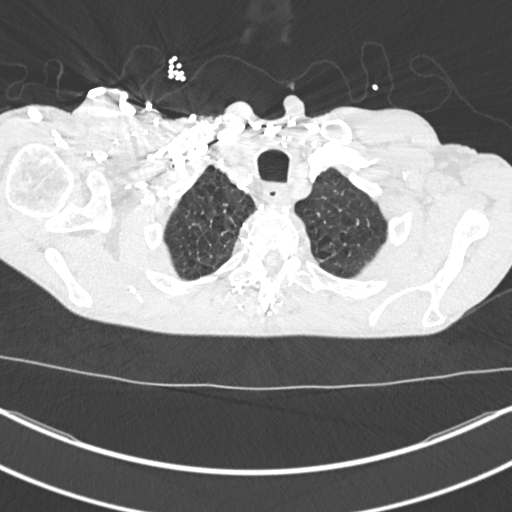
[im 389/430  mediastinal]
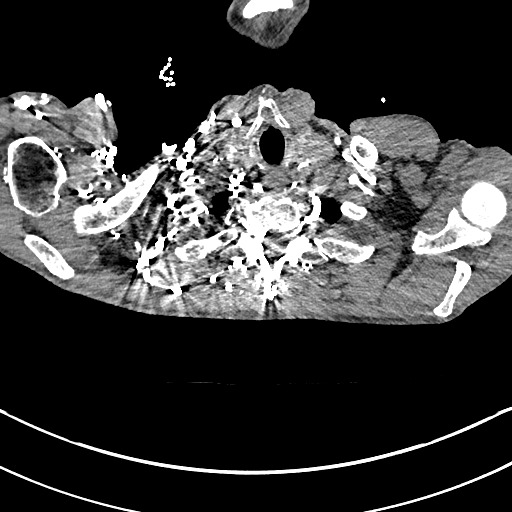
[im 409/430  lung]
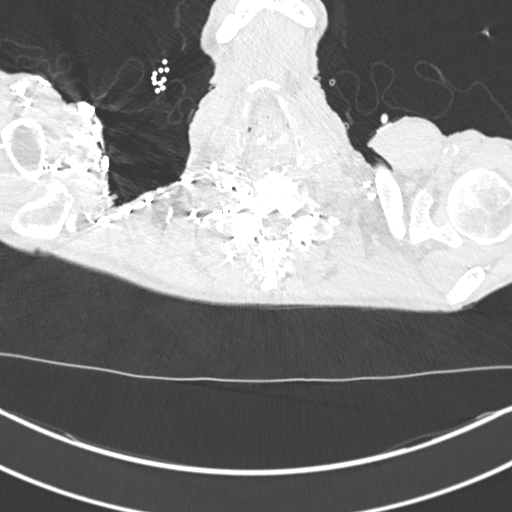

[Series 7: cor soft · coronal · 0.70mm/px · 1 of 130 slices shown]
[im 65/130  mediastinal]
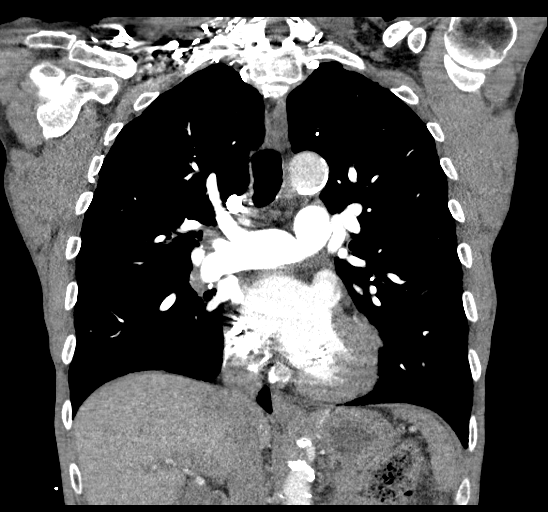

[18 of 36 positions shown; findings below may reference images not displayed]

RADIATION DOSE REDUCTION: This exam was performed according to the
departmental dose-optimization program which includes automated
exposure control, adjustment of the mA and/or kV according to
patient size and/or use of iterative reconstruction technique.

CONTRAST:  80mL OMNIPAQUE IOHEXOL 350 MG/ML SOLN
FINDINGS: Cardiovascular: Adequate contrast opacification of the pulmonary
arteries. Peripheral filling defect of the interlobar pulmonary
artery with associated calcification, likely chronic post thrombotic
change. Additional ill-defined filling defects are seen distally in
the right lower lobe segmental and subsegmental pulmonary arteries
which are likely due to slow flow. Normal heart size. No pericardial
effusion. Left main and three-vessel coronary artery calcifications.
Atherosclerotic disease of the thoracic aorta. Numerous venous
collaterals of the right neck, right arm and upper right chest,
similar to prior exam and likely due to chronic occlusion of the
right subclavian and jugular veins.

Mediastinum/Nodes: Esophagus is unremarkable. Prominent right upper
paratracheal lymph node located on image 34 is unchanged when
compared with prior exam. Calcified mediastinal and hilar lymph
nodes. No pathologically enlarged lymph nodes are seen in the chest.

Lungs/Pleura: Central airways are patent. Centrilobular emphysema.
Right lower lobe ground-glass opacity. Linear opacities of the right
lower lobe and left lingula, likely due to scarring or atelectasis.
Small solid pulmonary nodules are stable. Reference nodule of the
right upper lobe measuring 4 mm on series 6, image 81.

Upper Abdomen: Atrophic kidneys.  No acute abnormality.

Musculoskeletal: No chest wall abnormality. No acute or significant
osseous findings.

Review of the MIP images confirms the above findings.
IMPRESSION: 1. Peripheral filling defect of the interlobar pulmonary artery with
associated calcification, likely chronic post thrombotic change.
Additional ill-defined filling defects are seen distally in the
right lower lobe segmental and subsegmental pulmonary arteries which
are likely due to slow flow.
2. Right lower lobe ground-glass opacity, likely due to infection or
aspiration.
3. Aortic Atherosclerosis (DJFPD-LJH.H) and Emphysema ([id number].T).

## 2022-11-01 MED ORDER — DILTIAZEM HCL ER COATED BEADS 180 MG PO CP24
360.0000 mg | ORAL_CAPSULE | Freq: Every day | ORAL | Status: DC
  Administered 2022-11-02: 360 mg via ORAL
  Filled 2022-11-01: qty 2

## 2022-11-01 NOTE — Progress Notes (Signed)
Patient converted to A flutter. Pt is asymptomatic, rates 80's-90's

## 2022-11-01 NOTE — Progress Notes (Signed)
PROGRESS NOTE    Calvin Carroll.  ZOX:[id number] DOB: 1956-04-03 DOA: 10/28/2022 PCP: [facility]   Brief Narrative:  Calvin Carroll. is a 67 y.o. male with medical history significant of hypertension, atrial flutter, CAD, GERD, anemia, diastolic CHF, ESRD status post renal transplant, COPD, chronic respiratory failure with hypoxia, cataract, macular degeneration presenting today for atrial fibrillation and consideration of ablation but found to be significantly short of breath and not currently candidate for procedure.  Given symptoms patient was admitted to the hospital with cardiology consult.  Assessment & Plan:   Principal Problem:   Atrial fibrillation with RVR (HCC) Active Problems:   Renal transplant recipient   HTN (hypertension)   COPD (chronic obstructive pulmonary disease) (HCC)   Atrial flutter (HCC)   Chronic respiratory failure with hypoxia (HCC)   GERD (gastroesophageal reflux disease)   CAD (coronary artery disease)   Nonexudative age-related macular degeneration   Kidney transplant failure   Anemia in chronic kidney disease  Atrial fibrillation SVT vs Afib/Flutter with RVR, now rate controlled Dyspnea -Patient admitted from EP procedure for possible ablation, procedures halted due to dyspnea without hypoxia -Patient unfortunately failed tykosin transition - cardiology to discuss further modalities for rate control -continue diltiazem every 8 hours -Continue Eliquis -Cardiology concerned that if patient has not controlled he may require implanted pacemaker and AV nodal ablation  Mild respiratory distress without hypoxia or hypercarbia, resolved -Procedure delayed as above due to patient's reported shortness of breath -Patient remains without hypoxia, overnight oxygen was placed on the patient with no recorded hypoxia. Patient was recently discharged 09/03/2022 on 2 L nasal cannula which has been weaned off at home over the past 2  months -Chest x-ray 10/30/2022 shows diffuse vascular congestion, resume diuretics as below  Acute COPD exacerbation, POA -Likely the cause of patient's dyspnea as above -Thankfully patient is not hypoxic and has minimal symptoms comparatively, able to ambulate without hypoxia today or overt symptoms -Continue steroid taper, would likely benefit from short taper at this point -Continue home inhalers   Chronic diastolic CHF Questionably in early exacerbation due to medication cessation -Patient appears euvolemic but has not been on torsemide in the last few days -torsemide prescribed "10mg  as needed" per the patient to avoid dehydration - as such his use is markedly inconsistent from week to week. -Resume torsemide at 5mg  daily to avoid inconsistent "prn" use of 10mg  dosing at home -Chest x-ray as above appears somewhat congested -Metoprolol ongoing -Defer to cardiology for any further inpatient testing while transitioning onto Tikosyn   Chronic nocturnal respiratory failure with hypoxia -Patient wears oxygen at night only. -Unclear if this is sleep apnea or other etiology for hypoxia   AKI History of ESRD Status post renal transplant -Creatinine baseline around 0.9, currently 1.1 -Currently on tacrolimus and mycophenolate(notes there has been discussion about changed azathioprine outpatient due to recurrent infections but he has not yet transitioned) -Follow electrolytes, renal function -Transition back to home prednisone after steroid taper with COPD as above   Hypertension - Diltiazem drip transitioning to PO - Continue home hydralazine and metoprolol per cardiology   CAD -Statin intolerance noted -Continue home, metoprolol, aspirin per cardiology   GERD - Continue home PPI   Anemia -Likely anemia of chronic disease given ESRD, follow repeat labs   Cataracts Macular degeneration - Listed in chart but not currently on any eyedrops  DVT prophylaxis: apixaban (ELIQUIS)  tablet 5 mg  Code Status:   Code Status: Full  Code Family Communication: None present  Status is: Inpatient  Dispo: The patient is from: Home              Anticipated d/c is to: Home              Anticipated d/c date is: 48 to 72 hours              Patient currently not medically stable for discharge given ongoing close monitoring required for Tikosyn initiation  Consultants:  Cardiology/EP  Procedures:  None planned  Antimicrobials:  None indicated  Subjective: No acute issues or events overnight, patient states increased cough this morning with no overt sputum production.  Denies any overt symptom changes but notes he feels "slightly more short of breath" but was confused that he was placed on oxygen overnight as he noted he was not hypoxic at that time(states his SpO2 was 98 on monitor)  Objective: Vitals:   10/31/22 1703 10/31/22 2022 11/01/22 0023 11/01/22 0539  BP: 137/88 (!) 149/82 (!) 154/96 (!) 138/91  Pulse:  87 85 79  Resp: 19 20 17 18   Temp:  98.1 F (36.7 C)  97.8 F (36.6 C)  TempSrc:  Oral  Oral  SpO2: 97% 93% 95% 94%  Weight:    48.5 kg  Height:        Intake/Output Summary (Last 24 hours) at 11/01/2022 0745 Last data filed at 11/01/2022 0543 Gross per 24 hour  Intake 240 ml  Output 1000 ml  Net -760 ml    Filed Weights   10/30/22 0612 10/31/22 0456 11/01/22 0539  Weight: 49.1 kg 49 kg 48.5 kg    Examination:  General:  Pleasantly resting in bed, No acute distress. HEENT:  Normocephalic atraumatic.  Sclerae nonicteric, noninjected.  Extraocular movements intact bilaterally. Neck:  Without mass or deformity.  Trachea is midline. Lungs:  Clear to auscultate bilaterally without overt rhonchi, wheeze, or rales. Heart: Irregularly irregular.  Without murmurs, rubs, or gallops. Abdomen:  Soft, nontender, nondistended.  Without guarding or rebound. Extremities: Without cyanosis, clubbing, edema, or obvious deformity. Skin:  Warm and dry, no  erythema.  Data Reviewed: I have personally reviewed following labs and imaging studies  CBC: Recent Labs  Lab 10/28/22 1420 10/29/22 0221 10/30/22 0218 10/31/22 0252 11/01/22 0200  WBC 4.2 2.7* 7.9 6.6 7.1  HGB 12.3* 12.1* 11.6* 11.9* 12.5*  HCT 37.3* 37.3* 35.3* 36.5* 38.6*  MCV 93.5 92.8 94.6 96.3 94.1  PLT 170 171 176 178 188    Basic Metabolic Panel: Recent Labs  Lab 10/28/22 1420 10/28/22 2002 10/29/22 0221 10/30/22 0218 10/30/22 1156 10/31/22 0252  NA 137  --  130* 134* 132* 133*  K 4.1  --  4.6 4.4 4.3 4.1  CL 103  --  100 102 102 101  CO2 23  --  23 21* 24 24  GLUCOSE 93  --  323* 196* 133* 141*  BUN 17  --  20 30* 25* 24*  CREATININE 1.28*  --  1.38* 1.70* 1.28* 1.13  CALCIUM 9.4  --  8.8* 8.8* 8.8* 8.5*  MG 1.6* 3.0* 2.5* 2.1  --  2.1    GFR: Estimated Creatinine Clearance: 44.7 mL/min (by C-G formula based on SCr of 1.13 mg/dL). Liver Function Tests: Recent Labs  Lab 10/28/22 1420 10/29/22 0221  AST 27 28  ALT 17 16  ALKPHOS 85 85  BILITOT 1.1 0.6  PROT 6.6 6.4*  ALBUMIN 3.3* 2.9*    Coagulation Profile: Recent  Labs  Lab 10/28/22 1420  INR 1.4*    No results found for this or any previous visit (from the past 240 hour(s)).   Radiology Studies: DG Chest 2 View  Result Date: 10/30/2022 CLINICAL DATA:  Shortness of breath. EXAM: CHEST - 2 VIEW COMPARISON:  10/28/2022 FINDINGS: The cardiopericardial silhouette is within normal limits for size. Underlying chronic interstitial changes again noted with some slight increase in parahilar interstitial opacity on the current study. Tiny bilateral pleural effusions. Telemetry leads overlie the chest. IMPRESSION: 1. Chronic interstitial changes with slight increase in parahilar interstitial opacity since previous study suggesting mild interstitial pulmonary edema. 2. Tiny bilateral pleural effusions. Electronically Signed   By: Kennith Center M.D.   On: 10/30/2022 10:46     Scheduled Meds:  apixaban   5 mg Oral BID   arformoterol  15 mcg Nebulization BID   And   umeclidinium bromide  1 puff Inhalation Daily   aspirin EC  81 mg Oral Daily   budesonide  0.5 mg Nebulization BID   diltiazem  90 mg Oral Q8H   hydrALAZINE  50 mg Oral BID   metoprolol tartrate  50 mg Oral BID   mycophenolate  180 mg Oral BID   nicotine  14 mg Transdermal Daily   pantoprazole  40 mg Oral QAC supper   predniSONE  10 mg Oral BID WC   [START ON 11/04/2022] predniSONE  10 mg Oral Q breakfast   sodium chloride flush  3 mL Intravenous Q12H   sodium chloride flush  3 mL Intravenous Q12H   tacrolimus  1.5 mg Oral q AM   tacrolimus  2 mg Oral QHS   torsemide  10 mg Oral Daily   Continuous Infusions:  sodium chloride       LOS: 3 days    Time spent:    Azucena Fallen, DO [facility]  If 7PM-7AM, please contact night-coverage [email or web address]  11/01/2022, 7:45 AM

## 2022-11-01 NOTE — Progress Notes (Signed)
Patient Name: Calvin Carroll. Date of Encounter: 11/01/2022  Primary Cardiologist: None Electrophysiologist: Lanier Prude, MD  Interval Summary   The patient is doing well today.  Reports feeling well at rest, but feels weak/tired with exertion. At this time, the patient denies chest pain, shortness of breath, or any new concerns.  Inpatient Medications    Scheduled Meds:  apixaban  5 mg Oral BID   arformoterol  15 mcg Nebulization BID   And   umeclidinium bromide  1 puff Inhalation Daily   aspirin EC  81 mg Oral Daily   budesonide  0.5 mg Nebulization BID   diltiazem  90 mg Oral Q8H   hydrALAZINE  50 mg Oral BID   metoprolol tartrate  50 mg Oral BID   mycophenolate  180 mg Oral BID   nicotine  14 mg Transdermal Daily   pantoprazole  40 mg Oral QAC supper   predniSONE  10 mg Oral BID WC   [START ON 11/04/2022] predniSONE  10 mg Oral Q breakfast   sodium chloride flush  3 mL Intravenous Q12H   sodium chloride flush  3 mL Intravenous Q12H   tacrolimus  1.5 mg Oral q AM   tacrolimus  2 mg Oral QHS   torsemide  10 mg Oral Daily   Continuous Infusions:  sodium chloride     PRN Meds: sodium chloride, acetaminophen **OR** acetaminophen, albuterol, alum & mag hydroxide-simeth, guaiFENesin, polyethylene glycol, senna, sodium chloride flush   Vital Signs    Vitals:   11/01/22 0805 11/01/22 0806 11/01/22 0826 11/01/22 0853  BP:   135/89 117/73  Pulse:   74 74  Resp:    20  Temp:    (!) 97.5 F (36.4 C)  TempSrc:    Oral  SpO2: 94% 96%  97%  Weight:      Height:        Intake/Output Summary (Last 24 hours) at 11/01/2022 0934 Last data filed at 11/01/2022 6948 Gross per 24 hour  Intake 240 ml  Output 1200 ml  Net -960 ml   Filed Weights   10/30/22 0612 10/31/22 0456 11/01/22 0539  Weight: 49.1 kg 49 kg 48.5 kg    Physical Exam    GEN- The patient is well appearing, alert and oriented x 3 today.   Lungs- Clear to ausculation bilaterally, normal work of  breathing Cardiac- Regular rate and rhythm (AFL on monitor), no murmurs, rubs or gallops GI- soft, NT, ND, + BS Extremities- no clubbing or cyanosis. No edema  Telemetry    SR-ST until around 2330 on 7/8, then converted to AFL 60-90's (personally reviewed)  Hospital Course    Calvin Carroll. is a 66 y.o. male with a past medical history significant for COPD, tobacco abuse, AF/AFL, hx of renal transplants on tacrolimus.     He was admitted for drug load after presenting for atrial fibrillation and being found to be a poor candidate for the procedure due to acute respiratory illness. He was admitted for Tikosyn load but due to AKI and QTc prolongation, this was stopped and he was started on Cardizem.  Assessment & Plan    Persistent Atrial Fibrillation & Flutter  Ablation deferred 7/5 due to subacute respiratory illness.  -limited drug options, admitted for Tikosyn load but did not tolerate due to AKI, possible drug-drug interaction with Tacrolimus -converted to SR on Tikosyn, then back to flutter on 7/8 2330 -continue eliquis 5mg  BID  -cardizem 60 mg Q8,  plan to consolidate closer to discharge  -ambulate patient, pending response with exertion, may need to consider additional treatment options -not likely to be able to resume Tikosyn  QT Prolongation  -avoid QT prolonging agents  -follow K/Mg+  AKI Baseline cr ~0.7-1.2, frequent episodes of AKI in last year  -per primary       For questions or updates, please contact [facility] Please consult [email or web address] for contact info under Cardiology/STEMI.  Signed, Canary Brim, MSN, APRN, NP-C, AGACNP-BC [facility] - Electrophysiology  11/01/2022, 9:39 AM

## 2022-11-02 ENCOUNTER — Other Ambulatory Visit (HOSPITAL_COMMUNITY): Payer: Self-pay

## 2022-11-02 ENCOUNTER — Encounter (HOSPITAL_COMMUNITY): Payer: Self-pay | Admitting: Pharmacist

## 2022-11-02 DIAGNOSIS — I4891 Unspecified atrial fibrillation: Secondary | ICD-10-CM | POA: Diagnosis not present

## 2022-11-02 LAB — CBC
HCT: 38.6 % — ABNORMAL LOW (ref 39.0–52.0)
Hemoglobin: 12.8 g/dL — ABNORMAL LOW (ref 13.0–17.0)
MCH: 31.1 pg (ref 26.0–34.0)
MCHC: 33.2 g/dL (ref 30.0–36.0)
MCV: 93.9 fL (ref 80.0–100.0)
Platelets: 168 10*3/uL (ref 150–400)
RBC: 4.11 MIL/uL — ABNORMAL LOW (ref 4.22–5.81)
RDW: 16.3 % — ABNORMAL HIGH (ref 11.5–15.5)
WBC: 6 10*3/uL (ref 4.0–10.5)
nRBC: 0.3 % — ABNORMAL HIGH (ref 0.0–0.2)

## 2022-11-02 MED ORDER — DILTIAZEM HCL ER COATED BEADS 360 MG PO CP24: 360.0000 mg | ORAL_CAPSULE | Freq: Every day | ORAL | 1 refills | Status: DC

## 2022-11-02 MED ORDER — DILTIAZEM HCL ER COATED BEADS 360 MG PO CP24
360.0000 mg | ORAL_CAPSULE | Freq: Every day | ORAL | 0 refills | Status: DC
  Filled 2022-11-02: qty 7, 7d supply, fill #0

## 2022-11-02 MED ORDER — PREDNISONE 10 MG PO TABS: 10.0000 mg | ORAL_TABLET | Freq: Two times a day (BID) | ORAL | 0 refills | Status: AC

## 2022-11-02 MED ORDER — PREDNISONE 5 MG PO TABS: 5.0000 mg | ORAL_TABLET | Freq: Every day | ORAL | Status: DC

## 2022-11-02 MED ORDER — TORSEMIDE 10 MG PO TABS: 10.0000 mg | ORAL_TABLET | Freq: Every day | ORAL | 1 refills | Status: AC

## 2022-11-02 MED ORDER — NICOTINE 14 MG/24HR TD PT24: 14.0000 mg | MEDICATED_PATCH | Freq: Every day | TRANSDERMAL | 0 refills | Status: DC

## 2022-11-02 MED ORDER — PREDNISONE 10 MG PO TABS: 10.0000 mg | ORAL_TABLET | Freq: Every day | ORAL | 0 refills | Status: AC

## 2022-11-02 NOTE — Plan of Care (Signed)

## 2022-11-02 NOTE — Progress Notes (Signed)
Patient ok to discharge from EP perspective. He has failed all anti-arrhythmics and severe chronic lung disease precludes ablation.  Discharge plan for EP as below:   -continue cardizem CD 360 mg every day -continue eliquis  -follow up with EP arranged     Calvin Brim, MSN, APRN, NP-C, AGACNP-BC [facility] - Electrophysiology  11/02/2022, 1:50 PM

## 2022-11-02 NOTE — Discharge Summary (Signed)
Physician Discharge Summary  Calvin Carroll. UVO:[id number] DOB: [birth year] DOA: 10/28/2022  PCP: [facility]  Admit date: 10/28/2022 Discharge date: 11/02/2022  Admitted From: Home Disposition:  Home  Discharge Condition:Stable CODE STATUS:FULL Diet recommendation: Heart Healthy  Brief/Interim Summary: Calvin Carroll. is a 67 y.o. male with medical history significant of hypertension, atrial flutter, CAD, GERD, anemia, diastolic CHF, ESRD status post renal transplant, COPD, chronic respiratory failure with hypoxia, cataract, macular degeneration who presented with persistent  atrial fibrillation and consideration of ablation but found to be significantly short of breath and not currently candidate for procedure.  Given symptoms patient was admitted to the hospital with cardiology consult.  Patient was started on Cardizem which was later converted to long-acting diltiazem.  EP cleared for discharge because his heart rate is well-controlled today, he will followed by EP as an outpatient and if he fails rate control, will consider implanting permanent pacemaker and perform AV node ablation.  Medically stable for discharge.  Following problems were addressed during the hospitalization:  Atrial fibrillation SVT vs Afib/Flutter with RVR, now rate controlled Dyspnea -Patient admitted from EP procedure for possible ablation, procedures halted due to dyspnea without hypoxia -Patient unfortunately failed tykosin transition - cardiology to discuss further modalities for rate control -continue diltiazem every 8 hours -Continue Eliquis -Cardiology concerned that if patient has not controlled he may require implanted pacemaker and AV nodal ablation -Continue diltiazem at current dose.  Today heart rate is well-controlled.   Mild respiratory distress without hypoxia or hypercarbia, resolved -Procedure delayed as above due to patient's reported shortness of breath -Patient remains  without hypoxia, overnight oxygen was placed on the patient with no recorded hypoxia. Patient was recently discharged 09/03/2022 on 2 L nasal cannula which has been weaned off at home over the past 2 months -Chest x-ray 10/30/2022 shows diffuse vascular congestion, resume diuretics as below.  Currently appears euvolemic   Acute COPD exacerbation, POA -Likely the cause of patient's dyspnea as above -Thankfully patient is not hypoxic and has minimal symptoms comparatively, able to ambulate without hypoxia today or overt symptoms -Continue steroid taper, would likely benefit from short taper at this point -Continue home inhalers   Chronic diastolic CHF Questionably in early exacerbation due to medication cessation -Patient appears euvolemic but has not been on torsemide in the last few days -torsemide prescribed "10mg  as needed" per the patient to avoid dehydration - as such his use is markedly inconsistent from week to week. -Resume torsemide at 10mg  daily to avoid inconsistent "prn" use of 10mg  dosing at home -Chest x-ray as above appears somewhat congested -Metoprolol ongoing   Chronic nocturnal respiratory failure with hypoxia -Patient wears oxygen at night only. -Unclear if this is sleep apnea or other etiology for hypoxia   AKI History of ESRD Status post renal transplant -Creatinine baseline around 0.9, currently 1.1 -Currently on tacrolimus and mycophenolate -Transition back to home prednisone after steroid taper with COPD as above   Hypertension - Continue home hydralazine and metoprolol per cardiology   CAD -Statin intolerance noted -Continue home, metoprolol, aspirin per cardiology   GERD - Continue home PPI   Anemia -Likely anemia of chronic disease given ESRD   Cataracts Macular degeneration - Listed in chart but not currently on any eyedrops  Discharge Diagnoses:  Principal Problem:   Atrial fibrillation with RVR (HCC) Active Problems:   Renal transplant  recipient   HTN (hypertension)   COPD (chronic obstructive pulmonary disease) (HCC)   Atrial  flutter (HCC)   Chronic respiratory failure with hypoxia (HCC)   GERD (gastroesophageal reflux disease)   CAD (coronary artery disease)   Nonexudative age-related macular degeneration   Kidney transplant failure   Anemia in chronic kidney disease    Discharge Instructions  Discharge Instructions     Diet - low sodium heart healthy   Complete by: As directed    Discharge instructions   Complete by: As directed    1)Please take prescribed medications as instructed 2)Follow up with your PCP in a week.  Follow-up with your cardiologist   Increase activity slowly   Complete by: As directed       Allergies as of 11/02/2022       Reactions   Atorvastatin Other (See Comments)        Medication List     STOP taking these medications    azaTHIOprine 50 MG tablet Commonly known as: IMURAN   Stiolto Respimat 2.5-2.5 MCG/ACT Aers Generic drug: Tiotropium Bromide-Olodaterol       TAKE these medications    apixaban 5 MG Tabs tablet Commonly known as: ELIQUIS Take 1 tablet (5 mg total) by mouth 2 (two) times daily.   Cholecalciferol 50 MCG (2000 UT) Tabs Take 1 tablet by mouth daily.   diltiazem 360 MG 24 hr capsule Commonly known as: CARDIZEM CD Take 1 capsule (360 mg total) by mouth daily. Start taking on: November 03, 2022 What changed:  medication strength how much to take   fexofenadine 180 MG tablet Commonly known as: ALLEGRA Take 180 mg by mouth daily as needed for allergies.   Fish Oil 1000 MG Caps Take 2 capsules by mouth daily.   flunisolide 25 MCG/ACT (0.025%) Soln Commonly known as: NASALIDE Place 2 sprays into the nose 2 (two) times daily.   hydrALAZINE 50 MG tablet Commonly known as: APRESOLINE Take 1 tablet (50 mg total) by mouth in the morning and at bedtime.   levalbuterol 45 MCG/ACT inhaler Commonly known as: XOPENEX HFA Inhale 2 puffs into the  lungs every 6 (six) hours as needed for wheezing.   metoprolol tartrate 50 MG tablet Commonly known as: LOPRESSOR Take 1 tablet (50 mg total) by mouth 2 (two) times daily.   Mometasone Furoate 200 MCG/ACT Aero Inhale 2 each into the lungs in the morning and at bedtime.   MUCINEX PO Take 2 tablets by mouth every 12 (twelve) hours.   Mycophenolic Acid 180 MG Tbec Take 180 mg by mouth 2 (two) times daily.   nicotine 14 mg/24hr patch Commonly known as: NICODERM CQ - dosed in mg/24 hours Place 1 patch (14 mg total) onto the skin daily. Start taking on: November 03, 2022   pantoprazole 40 MG tablet Commonly known as: PROTONIX Take 40 mg by mouth daily before supper.   potassium chloride SA 20 MEQ tablet Commonly known as: KLOR-CON M Take 20 mEq by mouth once.   predniSONE 10 MG tablet Commonly known as: DELTASONE Take 1 tablet (10 mg total) by mouth 2 (two) times daily with a meal for 2 days. What changed: You were already taking a medication with the same name, and this prescription was added. Make sure you understand how and when to take each.   predniSONE 5 MG tablet Commonly known as: DELTASONE Take 1 tablet (5 mg total) by mouth daily with breakfast. Resume this after finishing 10 mg prednisone What changed: additional instructions   predniSONE 10 MG tablet Commonly known as: DELTASONE Take 1 tablet (10 mg  total) by mouth daily with breakfast for 3 days. Start taking on: November 04, 2022 What changed: You were already taking a medication with the same name, and this prescription was added. Make sure you understand how and when to take each.   tacrolimus 1 MG capsule Commonly known as: PROGRAF Take 1 mg by mouth See admin instructions. Take 1 mg in the morning (take with 0.5 mg to equal 1.5 mg in the morning) and 2 mg in the evening   tacrolimus 0.5 MG capsule Commonly known as: PROGRAF Take 0.5 mg by mouth See admin instructions. Take 0.5 mg daily in the morning with the 1  mg capsule to equal 1.5 mg in the morning   tiotropium 18 MCG inhalation capsule Commonly known as: SPIRIVA Place 18 mcg into inhaler and inhale daily.   torsemide 10 MG tablet Commonly known as: DEMADEX Take 1 tablet (10 mg total) by mouth daily. Start taking on: November 03, 2022 What changed:  medication strength how much to take when to take this reasons to take this        Follow-up Information     [facility] Schedule an appointment as soon as possible for a visit in 1 week(s).   Contact information: [address] [facility] [place] [address] [phone]                Allergies  Allergen Reactions   Atorvastatin Other (See Comments)    Consultations: Cardiology   Procedures/Studies: DG Chest 2 View  Result Date: 10/30/2022 CLINICAL DATA:  Shortness of breath. EXAM: CHEST - 2 VIEW COMPARISON:  10/28/2022 FINDINGS: The cardiopericardial silhouette is within normal limits for size. Underlying chronic interstitial changes again noted with some slight increase in parahilar interstitial opacity on the current study. Tiny bilateral pleural effusions. Telemetry leads overlie the chest. IMPRESSION: 1. Chronic interstitial changes with slight increase in parahilar interstitial opacity since previous study suggesting mild interstitial pulmonary edema. 2. Tiny bilateral pleural effusions. Electronically Signed   By: Kennith Center M.D.   On: 10/30/2022 10:46   X-ray chest PA and lateral  Result Date: 10/28/2022 CLINICAL DATA:  [id number] Dyspnea [id number] EXAM: CHEST - 2 VIEW COMPARISON:  08/31/2022 FINDINGS: Mild perihilar interstitial edema or infiltrates as before, with some septal lines seen peripherally in the lung bases left greater than right . No confluent airspace disease. Heart size and mediastinal contours are within normal limits. Aortic Atherosclerosis (ICD10-170.0). No effusion. Visualized bones unremarkable. IMPRESSION: Mild perihilar  interstitial edema or infiltrates. Electronically Signed   By: Corlis Leak M.D.   On: 10/28/2022 13:32   CT CARDIAC MORPH/PULM VEIN W/CM&W/O CA SCORE  Addendum Date: 10/23/2022   ADDENDUM REPORT: 10/23/2022 07:56 EXAM: OVER-READ INTERPRETATION  CT CHEST The following report is an over-read performed by radiologist Dr. Noe Gens [facility] on 10/23/2022. This over-read does not include interpretation of cardiac or coronary anatomy or pathology. The coronary CTA interpretation by the cardiologist is attached. COMPARISON:  08/31/2022 FINDINGS: Heart borderline in size. Moderate aortic atherosclerosis, most pronounced in the descending thoracic aorta. No evidence of aortic aneurysm. Prominent right hilar lymph node measures up to 14 mm. Few scattered partially calcified subcarinal and right hilar lymph nodes compatible with old granulomatous disease. Calcified granuloma in the left lower lobe. Emphysema. Chronic linear densities in the lingula and right lower lobe compatible with scarring. No effusions. No acute findings in the upper abdomen. Chest wall soft tissues are unremarkable. No acute bony abnormality. IMPRESSION: Old  granulomatous disease. Mildly prominent right hilar lymph node is stable since prior study. Scarring in the lung bases. Aortic atherosclerosis. Emphysema. Electronically Signed   By: Charlett Nose M.D.   On: 10/23/2022 07:56   Result Date: 10/23/2022 CLINICAL DATA:  Atrial fibrillation scheduled for an ablation. EXAM: Cardiac CT/CTA TECHNIQUE: The patient was scanned on a Siemens Somatom scanner. FINDINGS: A 120 kV prospective scan was triggered in the descending thoracic aorta at 111 HU's. Gantry rotation speed was 280 msecs and collimation was .9 mm. No beta blockade and no NTG was given. The 3D data set was reconstructed in 5% intervals of the 60-80 % of the R-R cycle. Diastolic phases were analyzed on a dedicated work station using MPR, MIP and VRT modes. The patient  received 80 cc of contrast. There is normal pulmonary vein drainage into the left atrium (2 on the right and 2 on the left) with ostial measurements as follows: RUPV: 21 x 16 mm, Area 24 mm2 RLPV: 21 x 17 mm, Area 26 mm2 LUPV: 23 x 18 mm, Area 34 mm2 LLPV: 19 x 11 mm, Area 15 mm2 The left atrial appendage is a mixed chicken wing-Windsock type with two lobes and ostial size 21 x 21 mm and length 33 mm, Area 33 mm2. There is contrast filling defect but no thrombus in the left atrial appendage. The esophagus runs in the left atrial midline and is not in the proximity to any of the pulmonary veins. Aorta:  Normal caliber.  No dissection or calcifications. Aortic Valve: Trileaflet. mild calcifications. Coronary Arteries: Normal coronary origin. Right dominance. The study was performed without use of NTG and insufficient for plaque evaluation. IMPRESSION: 1. There is normal pulmonary vein drainage into the left atrium. 2. The left atrial appendage is a mixed chicken wing-Windsock type with two lobes and ostial size 21 x 21 mm and length 33 mm, Area 33 mm2. There is contrast filling defect but no thrombus in the left atrial appendage. 3. The esophagus runs in the left atrial midline and is not in the proximity to any of the pulmonary veins. 4. Coronary calcium score 1658. This is 96th percentile for age and gender matched controls. Electronically Signed: By: Debbe Odea M.D. On: 10/19/2022 08:07      Subjective: Patient seen and examined the bedside this afternoon.  Hemodynamically stable heart rate well-controlled.  Very eager to go home.  Medically stable for discharge.I called  his significant other on phone and discussed about discharge planning  Discharge Exam: Vitals:   11/02/22 0903 11/02/22 0905  BP:    Pulse: 89   Resp: 17   Temp:    SpO2: 97% 97%   Vitals:   11/02/22 0734 11/02/22 0834 11/02/22 0903 11/02/22 0905  BP: (!) 150/85     Pulse: 78 83 89   Resp: (!) 21  17   Temp: 98.2 F  (36.8 C)     TempSrc: Oral     SpO2: 96%  97% 97%  Weight:      Height:        General: Pt is alert, awake, not in acute distress Cardiovascular: irregularly irregular rhythm, no rubs, no gallops Respiratory: CTA bilaterally, no wheezing, no rhonchi Abdominal: Soft, NT, ND, bowel sounds + Extremities: no edema, no cyanosis    The results of significant diagnostics from this hospitalization (including imaging, microbiology, ancillary and laboratory) are listed below for reference.     Microbiology: No results found for this or any previous  visit (from the past 240 hour(s)).   Labs: BNP (last 3 results) Recent Labs    08/12/22 1124 08/14/22 1500 10/28/22 1420  BNP 1,240.0* 678.0* 1,495.6*   Basic Metabolic Panel: Recent Labs  Lab 10/28/22 1420 10/28/22 2002 10/29/22 0221 10/30/22 0218 10/30/22 1156 10/31/22 0252  NA 137  --  130* 134* 132* 133*  K 4.1  --  4.6 4.4 4.3 4.1  CL 103  --  100 102 102 101  CO2 23  --  23 21* 24 24  GLUCOSE 93  --  323* 196* 133* 141*  BUN 17  --  20 30* 25* 24*  CREATININE 1.28*  --  1.38* 1.70* 1.28* 1.13  CALCIUM 9.4  --  8.8* 8.8* 8.8* 8.5*  MG 1.6* 3.0* 2.5* 2.1  --  2.1   Liver Function Tests: Recent Labs  Lab 10/28/22 1420 10/29/22 0221  AST 27 28  ALT 17 16  ALKPHOS 85 85  BILITOT 1.1 0.6  PROT 6.6 6.4*  ALBUMIN 3.3* 2.9*   No results for input(s): "LIPASE", "AMYLASE" in the last 168 hours. No results for input(s): "AMMONIA" in the last 168 hours. CBC: Recent Labs  Lab 10/29/22 0221 10/30/22 0218 10/31/22 0252 11/01/22 0200 11/02/22 0101  WBC 2.7* 7.9 6.6 7.1 6.0  HGB 12.1* 11.6* 11.9* 12.5* 12.8*  HCT 37.3* 35.3* 36.5* 38.6* 38.6*  MCV 92.8 94.6 96.3 94.1 93.9  PLT 171 176 178 188 168   Cardiac Enzymes: No results for input(s): "CKTOTAL", "CKMB", "CKMBINDEX", "TROPONINI" in the last 168 hours. BNP: Invalid input(s): "POCBNP" CBG: No results for input(s): "GLUCAP" in the last 168 hours. D-Dimer No  results for input(s): "DDIMER" in the last 72 hours. Hgb A1c No results for input(s): "HGBA1C" in the last 72 hours. Lipid Profile No results for input(s): "CHOL", "HDL", "LDLCALC", "TRIG", "CHOLHDL", "LDLDIRECT" in the last 72 hours. Thyroid function studies No results for input(s): "TSH", "T4TOTAL", "T3FREE", "THYROIDAB" in the last 72 hours.  Invalid input(s): "FREET3" Anemia work up No results for input(s): "VITAMINB12", "FOLATE", "FERRITIN", "TIBC", "IRON", "RETICCTPCT" in the last 72 hours. Urinalysis    Component Value Date/Time   COLORURINE AMBER (A) 12/03/2016 1026   APPEARANCEUR CLEAR 12/03/2016 1026   LABSPEC 1.023 12/03/2016 1026   PHURINE 5.0 12/03/2016 1026   GLUCOSEU NEGATIVE 12/03/2016 1026   HGBUR SMALL (A) 12/03/2016 1026   BILIRUBINUR NEGATIVE 12/03/2016 1026   KETONESUR 5 (A) 12/03/2016 1026   PROTEINUR >=300 (A) 12/03/2016 1026   NITRITE NEGATIVE 12/03/2016 1026   LEUKOCYTESUR NEGATIVE 12/03/2016 1026   Sepsis Labs Recent Labs  Lab 10/30/22 0218 10/31/22 0252 11/01/22 0200 11/02/22 0101  WBC 7.9 6.6 7.1 6.0   Microbiology No results found for this or any previous visit (from the past 240 hour(s)).  Please note: You were cared for by a hospitalist during your hospital stay. Once you are discharged, your primary care physician will handle any further medical issues. Please note that NO REFILLS for any discharge medications will be authorized once you are discharged, as it is imperative that you return to your primary care physician (or establish a relationship with a primary care physician if you do not have one) for your post hospital discharge needs so that they can reassess your need for medications and monitor your lab values.    Time coordinating discharge: 40 minutes  SIGNED:   Burnadette Pop, MD  [facility] 11/02/2022, 2:37 PM Pager [phone]  If 7PM-7AM, please contact night-coverage [email or web address] Password [email or web address]

## 2022-11-03 ENCOUNTER — Other Ambulatory Visit: Payer: No Typology Code available for payment source

## 2022-11-10 IMAGING — DX DG CHEST 1V PORT
1 series · 1 of 1 positions shown · non-contrast
Comparison: Chest radiograph and CT chest dated May 17, 2020

CLINICAL DATA: Palpitations, shortness of breath

EXAM:
PORTABLE CHEST 1 VIEW

[chest ap]
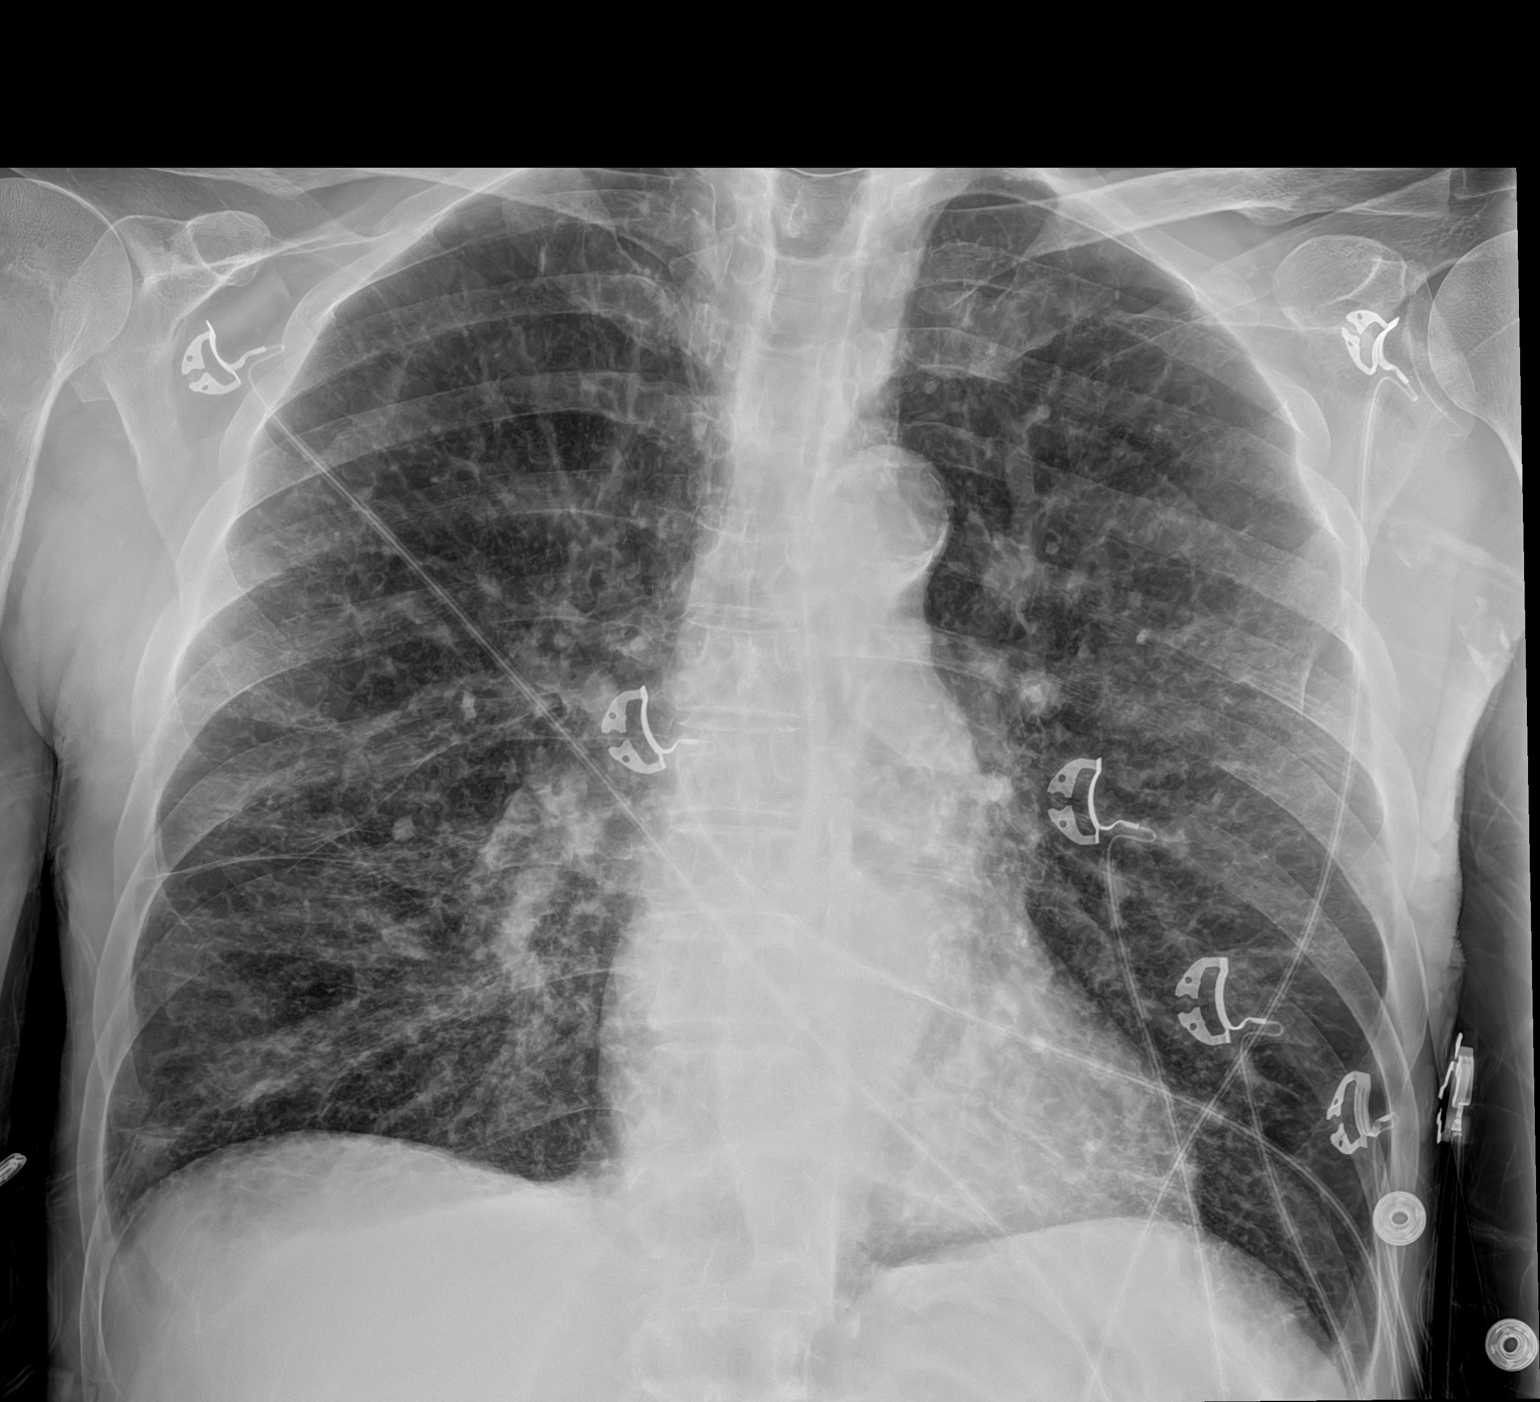

[1 of 1 positions shown; findings below may reference images not displayed]

FINDINGS: The heart is normal in size. Atherosclerotic calcification of the
aortic arch. Hyperinflated lungs. Bilateral interstitial markings
prominent in the bilateral lower lobes consistent with
fibrosis/interstitial lung disease. No appreciable focal
consolidation or large pleural effusion.
IMPRESSION: Emphysema and fibrotic changes of the lung bases without evidence of
acute cardiopulmonary process.

## 2022-11-11 ENCOUNTER — Ambulatory Visit (HOSPITAL_COMMUNITY): Payer: Medicare PPO | Admitting: Internal Medicine

## 2022-11-25 ENCOUNTER — Ambulatory Visit (HOSPITAL_COMMUNITY): Payer: Medicare PPO | Admitting: Internal Medicine

## 2022-12-04 NOTE — Progress Notes (Signed)
Cardiology Office Note Date:  12/04/2022  Patient ID:  Gerhart Hirschi., DOB 1955-11-25, MRN [id number] PCP:  [facility]  Electrophysiologist: Dr. Lalla Brothers    Chief Complaint: post hospital  History of Present Illness: Taquon Amicucci. is a 67 y.o. male with history of COPD (ongoing smoker), renal transplant (x2), AFib/flutter, AFib, HTN  Planned for EPS/ablation arrived for his procedure 10/28/22 reported increased SOB, DOE for a few days, felt to be in acute/chronic respiratory insufficiency and his procedure cancelled, admitted to medicine service His AF perhaps a contributor to his DOE though not felt to be the primary driver. Amiodarone not contraindicated though would require marked dose reduction in his Tacrolimus Tacrolimus can contribute to QT prolongation though not contraindicated with Tikosyn and Tikosyn would not affect Tacrolimus levels. Tikosyn was pursued >> AKI and QT prolongation developed and Tikosyn stopped/feltto be failure. No longer felt an ablation candidate 2/2 his severe lung disease No AAD options either. Planned for rate control strategy.  If unable to rate control pace ablate would be the next management strategy  TODAY No cardiac awareness, no CP, had a few days that his HR was elevated, but has been weeks since Breathing is at his baseline No near syncope or syncope  He saw his nephrologist ~2 weeks ago (at the [place]) she was concerned he said of increasing blood in his urine and advised he start back on lisinopril and to inquire with cardiology if he can come off/reduce/change the hydralazine He took the lisinopril and his BP got pretty low, so has held off taking more.   Past Medical History:  Diagnosis Date   Acute respiratory failure with hypoxia (HCC) 08/12/2022   Atrial fibrillation (HCC)    COPD (chronic obstructive pulmonary disease) (HCC)    COPD exacerbation (HCC) 05/17/2021   Hypertension    MI (myocardial infarction)  (HCC)    Renal disorder     Past Surgical History:  Procedure Laterality Date   CARDIOVERSION N/A 08/17/2022   Procedure: CARDIOVERSION;  Surgeon: Marjo Bicker, MD;  Location: [facility];  Service: Cardiovascular;  Laterality: N/A;   KIDNEY TRANSPLANT     [facility]   KIDNEY TRANSPLANT  2011   Pt has had 2 transplants   NEPHRECTOMY TRANSPLANTED ORGAN      Current Outpatient Medications  Medication Sig Dispense Refill   apixaban (ELIQUIS) 5 MG TABS tablet Take 1 tablet (5 mg total) by mouth 2 (two) times daily. 60 tablet 2   Cholecalciferol 50 MCG (2000 UT) TABS Take 1 tablet by mouth daily.     diltiazem (CARDIZEM CD) 360 MG 24 hr capsule Take 1 capsule (360 mg total) by mouth daily. 7 capsule 0   fexofenadine (ALLEGRA) 180 MG tablet Take 180 mg by mouth daily as needed for allergies.     flunisolide (NASALIDE) 25 MCG/ACT (0.025%) SOLN Place 2 sprays into the nose 2 (two) times daily.     guaiFENesin (MUCINEX PO) Take 2 tablets by mouth every 12 (twelve) hours.     hydrALAZINE (APRESOLINE) 50 MG tablet Take 1 tablet (50 mg total) by mouth in the morning and at bedtime.     levalbuterol (XOPENEX HFA) 45 MCG/ACT inhaler Inhale 2 puffs into the lungs every 6 (six) hours as needed for wheezing. 1 each 2   metoprolol tartrate (LOPRESSOR) 50 MG tablet Take 1 tablet (50 mg total) by mouth 2 (two) times daily. 60 tablet 1   Mometasone Furoate 200 MCG/ACT  AERO Inhale 2 each into the lungs in the morning and at bedtime.     Mycophenolate Sodium (MYCOPHENOLIC ACID) 180 MG TBEC Take 180 mg by mouth 2 (two) times daily.     nicotine (NICODERM CQ - DOSED IN MG/24 HOURS) 14 mg/24hr patch Place 1 patch (14 mg total) onto the skin daily. 28 patch 0   Omega-3 Fatty Acids (FISH OIL) 1000 MG CAPS Take 2 capsules by mouth daily.     pantoprazole (PROTONIX) 40 MG tablet Take 40 mg by mouth daily before supper.     potassium chloride SA (KLOR-CON M) 20 MEQ tablet Take 20 mEq by mouth once.     predniSONE  (DELTASONE) 5 MG tablet Take 1 tablet (5 mg total) by mouth daily with breakfast. Resume this after finishing 10 mg prednisone     tacrolimus (PROGRAF) 0.5 MG capsule Take 0.5 mg by mouth See admin instructions. Take 0.5 mg daily in the morning with the 1 mg capsule to equal 1.5 mg in the morning     tacrolimus (PROGRAF) 1 MG capsule Take 1 mg by mouth See admin instructions. Take 1 mg in the morning (take with 0.5 mg to equal 1.5 mg in the morning) and 2 mg in the evening     tiotropium (SPIRIVA) 18 MCG inhalation capsule Place 18 mcg into inhaler and inhale daily.     torsemide (DEMADEX) 10 MG tablet Take 1 tablet (10 mg total) by mouth daily. 30 tablet 1   No current facility-administered medications for this visit.    Allergies:   Atorvastatin   Social History:  The patient  reports that he has been smoking cigarettes. He has never used smokeless tobacco. He reports current alcohol use. He reports that he does not use drugs.   Family History:  The patient's family history is not on file.  ROS:  Please see the history of present illness.    All other systems are reviewed and otherwise negative.   PHYSICAL EXAM:  VS:  There were no vitals taken for this visit. BMI: There is no height or weight on file to calculate BMI. Well nourished, well developed, in no acute distress HEENT: normocephalic, atraumatic Neck: no JVD, carotid bruits or masses Cardiac:  irreg-irreg; no significant murmurs, no rubs, or gallops Lungs:  CTA b/l, no wheezing, rhonchi or rales Abd: soft, nontender MS: no deformity, advanced atrophy Ext: no edema Skin: warm and dry, no rash Neuro:  No gross deficits appreciated Psych: euthymic mood, full affect    EKG:  Done today and reviewed by myself shows  AFib 78bpm, RBBB  10/18/22: cardiac CT IMPRESSION: 1. There is normal pulmonary vein drainage into the left atrium. 2. The left atrial appendage is a mixed chicken wing-Windsock type with two lobes and ostial  size 21 x 21 mm and length 33 mm, Area 33 mm2. There is contrast filling defect but no thrombus in the left atrial appendage. 3. The esophagus runs in the left atrial midline and is not in the proximity to any of the pulmonary veins. 4. Coronary calcium score 1658. This is 96th percentile for age and gender matched controls.  IMPRESSION: Old granulomatous disease. Mildly prominent right hilar lymph node is stable since prior study. Scarring in the lung bases. Aortic atherosclerosis. Emphysema.   08/15/22: TTE 1. Left ventricular ejection fraction, by estimation, is 60 to 65%. The  left ventricle has normal function. The left ventricle has no regional  wall motion abnormalities. Left ventricular diastolic  function could not  be evaluated due to atrial arrythmia.   The E/e' is 26.9.   2. Right ventricular systolic function is normal. The right ventricular  size is normal. Tricuspid regurgitation signal is inadequate for assessing  PA pressure.   3. Left atrial size was severely dilated.   4. Right atrial size was moderately dilated.   5. The mitral valve is abnormal. Trivial mitral valve regurgitation. No  evidence of mitral stenosis. Moderate to severe mitral annular  calcification.   6. The aortic valve was not well visualized. Visually, aortic valve  regurgitation is mild but PHT measures 364 msec likely from diastolic  dysfunction. Mild aortic valve stenosis. Aortic valve area, by VTI  measures 1.03 cm. Aortic valve mean gradient  measures 14.0 mmHg. Aortic valve Vmax measures 2.43 m/s.   7. Aortic dilatation noted. There is borderline dilatation of the aortic  root, measuring 37 mm.   8. The inferior vena cava is dilated in size with <50% respiratory  variability, suggesting right atrial pressure of 15 mmHg.   9. Evidence of atrial level shunting detected by color flow Doppler.  There is a small patent foramen ovale with predominantly left to right  shunting across the  atrial septum.   Comparison(s): No significant change from prior study.     Recent Labs: 08/15/2022: TSH 0.365 10/28/2022: B Natriuretic Peptide 1,495.6 10/29/2022: ALT 16 10/31/2022: BUN 24; Creatinine, Ser 1.13; Magnesium 2.1; Potassium 4.1; Sodium 133 11/02/2022: Hemoglobin 12.8; Platelets 168  No results found for requested labs within last 365 days.   CrCl cannot be calculated (Patient's most recent lab result is older than the maximum 21 days allowed.).   Wt Readings from Last 3 Encounters:  11/02/22 106 lb 6.4 oz (48.3 kg)  09/03/22 113 lb 8.6 oz (51.5 kg)  08/22/22 111 lb 12.8 oz (50.7 kg)     Other studies reviewed: Additional studies/records reviewed today include: summarized above  ASSESSMENT AND PLAN:  Persistent AFib AFlutter CHA2DS2Vasc is 2, on Eliquis, appropriately dosed  Discussed no longer felt a rhythm control candidate No good AAD options and pulmonary disease is prohibitive of the ablation procedure.  Rate controlled  He denies visible blood in his urine, his nephrologist made no mentions of any concerns about his Eliquis. He will call the nephrologist for BP medication recs  HTN No objection to any BP management regime his nephrologist likes Will need to maintain his diltiazem    Secondary hypercoagulable state   Coronary CA++ No CP His last lipid panel 10/17/22 was pretty good He has been intolerant of statins with severe myalgias Urged re-discussion with his PMD for alternative lipid management options     Disposition: F/u with [place] in 3 mo, sooner if needed    Current medicines are reviewed at length with the patient today.  The patient did not have any concerns regarding medicines.  Norma Fredrickson, PA-C 12/04/2022 11:14 AM     [facility] [address] [place] [address] [phone] (office)  [phone] (fax)

## 2022-12-06 ENCOUNTER — Encounter: Payer: Self-pay | Admitting: Physician Assistant

## 2022-12-06 ENCOUNTER — Ambulatory Visit: Payer: Medicare PPO | Attending: Physician Assistant | Admitting: Physician Assistant

## 2022-12-06 VITALS — BP 108/70 | HR 78 | Ht 68.0 in | Wt 105.8 lb

## 2022-12-06 DIAGNOSIS — I10 Essential (primary) hypertension: Secondary | ICD-10-CM

## 2022-12-06 DIAGNOSIS — D6869 Other thrombophilia: Secondary | ICD-10-CM

## 2022-12-06 DIAGNOSIS — I4892 Unspecified atrial flutter: Secondary | ICD-10-CM

## 2022-12-06 DIAGNOSIS — I4819 Other persistent atrial fibrillation: Secondary | ICD-10-CM | POA: Diagnosis not present

## 2022-12-06 DIAGNOSIS — R931 Abnormal findings on diagnostic imaging of heart and coronary circulation: Secondary | ICD-10-CM

## 2022-12-06 NOTE — Patient Instructions (Signed)
Medication Instructions:   Your physician recommends that you continue on your current medications as directed. Please refer to the Current Medication list given to you today.\  *If you need a refill on your cardiac medications before your next appointment, please call your pharmacy*   Lab Work: NONE ORDERED  TODAY    If you have labs (blood work) drawn today and your tests are completely normal, you will receive your results only by: MyChart Message (if you have MyChart) OR A paper copy in the mail If you have any lab test that is abnormal or we need to change your treatment, we will call you to review the results.   Testing/Procedures: NONE ORDERED  TODAY     Follow-p: At [facility], you and your health needs are our priority.  As part of our continuing mission to provide you with exceptional heart care, we have created designated Provider Care Teams.  These Care Teams include your primary Cardiologist (physician) and Advanced Practice Providers (APPs -  Physician Assistants and Nurse Practitioners) who all work together to provide you with the care you need, when you need it.  We recommend signing up for the patient portal called "MyChart".  Sign up information is provided on this After Visit Summary.  MyChart is used to connect with patients for Virtual Visits (Telemedicine).  Patients are able to view lab/test results, encounter notes, upcoming appointments, etc.  Non-urgent messages can be sent to your provider as well.   To learn more about what you can do with MyChart, go to [email or web address].    Your next appointment:   2 month(s)  Provider:   Steffanie Dunn, MD    Other Instructions

## 2022-12-14 ENCOUNTER — Telehealth: Payer: Self-pay | Admitting: Cardiology

## 2022-12-14 NOTE — Telephone Encounter (Signed)
Patient called back, call sent straight through to triage. Patient stated his BP and HR have been low, BP 88/49 and HR 49. Patient stated it goes up and down. Patient has Diltiazem 360 mg, Lopressor 50 mg BID, and Hydralazine 50 mg BID on his medication list. Patient stated he stopped his diltiazem for 2 days, then took one of his Diltiazem 240 mg yesterday. Patient stated since his BP was up this morning he took Diltiazem 360 mg. Informed patient with his BP and HR that low he should go to ED or at least urgent care. Patient refusing to go to ED. Patient stated his girlfriend is "VP at [facility]". Patient then stated that he has been to the ED several times, and they cannot figure out what is going on with him, so he does not want to go to the ED. Made patient an appointment with Jacolyn Reedy PA for next Tuesday, first available. Patient stated he is seeing a doctor at the [place] on Friday. Encouraged patient to keep his appointments. Encouraged him to try to drink some fluids to keep his BP up, and hold any BP medications (metoprolol , hydralazine) if his BP and HR are still low when it is time to take them again. Patient wanted to see Dr. Lalla Brothers, but he does not have anything in the next couple of weeks. Encouraged patient to keep a log of his BP and HR readings. Will forward to Dr. Lalla Brothers and his nurse.

## 2022-12-14 NOTE — Telephone Encounter (Signed)
Tried to discuss patient's message with patient. While on the phone patient's call dropped. Tried to call patient back and it went straight to voicemail. Left message for patient to call back.

## 2022-12-14 NOTE — Telephone Encounter (Signed)
Pt c/o medication issue:  1. Name of Medication: Diltiazem  360 mg  2. How are you currently taking this medication (dosage and times per day)?   3. Are you having a reaction (difficulty breathing--STAT)?   4. What is your medication issue? Heart rate is 49, been like this for  the last hour

## 2022-12-14 NOTE — Telephone Encounter (Signed)
Patient is calling back, stated he got disconnected. Please advise

## 2022-12-15 NOTE — Progress Notes (Deleted)
Cardiology Office Note:  .   Date:  12/15/2022  ID:  Calvin Carroll., DOB 1956/01/20, MRN [id number] PCP: [facility] Providers Cardiologist:  None Electrophysiologist:  Calvin Prude, MD { Click to update primary MD,subspecialty MD or APP then REFRESH:1}   History of Present Illness: Calvin Carroll Kitchen   Calvin Carroll. is a 67 y.o. male  with COPD, tobacco abuse, AF/AFL, hx of renal transplantsPlanned for EPS/ablation but when he arrived for his procedure 10/28/22 reported increased SOB, DOE  felt to be in acute/chronic respiratory insufficiency and his procedure cancelled, admitted to medicine service.His AF perhaps a contributor to his DOE though not felt to be the primary driver. Rate control with diltiazem was advised.  At f/u nephrologist has wanted him on lisinopril for kidneys and asked if hydralazine could be adjusted for low BP.   Patient called in with hypotension. He had held his diltiazem for 2 days but when he took it BP dropped.  ROS: ***  Studies Reviewed: [facility] Kitchen         Prior CV Studies: {Select studies to display:26339}  ***  Risk Assessment/Calculations:   {Does this patient have ATRIAL FIBRILLATION?[phone]} No BP recorded.  {Refresh Note OR Click here to enter BP  :1}***       Physical Exam:   VS:  There were no vitals taken for this visit.   Wt Readings from Last 3 Encounters:  12/06/22 105 lb 12.8 oz (48 kg)  11/02/22 106 lb 6.4 oz (48.3 kg)  09/03/22 113 lb 8.6 oz (51.5 kg)    GEN: Well nourished, well developed in no acute distress NECK: No JVD; No carotid bruits CARDIAC: ***RRR, no murmurs, rubs, gallops RESPIRATORY:  Clear to auscultation without rales, wheezing or rhonchi  ABDOMEN: Soft, non-tender, non-distended EXTREMITIES:  No edema; No deformity   ASSESSMENT AND PLAN: .   Afib/flutter no longer a candidate for ablation due to respiratory problems and ongoing tobacco.  Has failed antiarrhythmics.    Hypotension  COPD   Tobacco abuse  CKD     {Are you ordering a CV Procedure (e.g. stress test, cath, DCCV, TEE, etc)?   Press F2        :[id number]}  Dispo: ***  Signed, Calvin Reedy, PA-C

## 2022-12-20 ENCOUNTER — Ambulatory Visit: Payer: Medicare PPO | Admitting: Physician Assistant

## 2023-02-01 ENCOUNTER — Ambulatory Visit: Payer: Medicare PPO | Admitting: Cardiology

## 2023-02-08 ENCOUNTER — Ambulatory Visit: Payer: Medicare PPO | Admitting: Cardiology

## 2023-03-06 ENCOUNTER — Ambulatory Visit: Payer: Medicare PPO | Admitting: Cardiology

## 2023-08-01 NOTE — Progress Notes (Signed)
 Electrophysiology Clinic Note  Primary Electrophysiologist:  History of Present Illness The patient presents for evaluation status post recent AVNA ablation.  He reports satisfactory healing of his groin area following the procedure. He has been experiencing a general sense of malaise for the past 3 days, which he attributes to severe allergies. He notes an improvement in his overall condition, as evidenced by his ability to engage in physical activities such as painting, which was previously challenging. He is currently on a regimen of Eliquis , administered twice daily.  His blood pressure has been mildly elevated and he self increased his lisinopril .  [facility]  Device interrogation completed during visit showing rates at 80 bpm.  Rate decreased to 60 bpm with rate response, VVIR.  Underlying rhythm was escape at about 60 bpm.  Review of Systems: General: Denies weakness. Denies fever or chills. Pulmonary: Denies cough or SHoB.  No undue DOE. Cardiovascular: Denies chest pain. Denies palpitations, dizziness or syncope.  A total of 14 systems were reviewed and all systems were negative in detail except as noted in the HPI.  Health Status: Past Medical History: Past Medical History:  Diagnosis Date  . Arthritis     Allergies and Intolerances: Allergies  Allergen Reactions  . Atorvastatin  Myalgias and Other (See Comments)    Family History: No family history on file.  Social History:  reports that he has been smoking cigarettes. He has never used smokeless tobacco. He reports current alcohol use. He reports that he does not use drugs.  Current Home Medications:  Current Outpatient Medications:  .  apixaban  (Eliquis ) 5 mg tab, Take 5 mg by mouth 2 (two) times a day., Disp: , Rfl:  .  azaTHIOprine  (IMURAN ) 50 mg tablet, Take 50 mg by mouth Once Daily., Disp: 30 tablet, Rfl: 11 .  cholecalciferol (VITAMIN D3) 2,000 unit tablet, Take 1 tablet by mouth  daily., Disp: , Rfl:  .  cyanocobalamin  (VITAMIN B12) 500 mcg tablet, Take 1,000 mcg by mouth daily., Disp: , Rfl:  .  [Paused] dilTIAZem  (CARDIZEM  CD) 240 mg 24 hr capsule, Take 240 mg by mouth daily., Disp: , Rfl:  .  formoterol (PERFOROMIST) 20 mcg/2 mL nebu nebulizer solution, Take 2 mL (20 mcg total) by nebulization in the morning and 2 mL (20 mcg total) before bedtime., Disp: 360 mL, Rfl: 0 .  hydrALAZINE  (APRESOLINE ) 25 mg tablet, HOLD until follow-up with PCP, Disp: 270 tablet, Rfl: 0 .  levalbuterol  (XOPENEX ) 0.63 mg/3 mL nebulizer solution, Take 3 mL (0.63 mg total) by nebulization every 4 (four) hours as needed for wheezing or shortness of breath., Disp: 180 mL, Rfl: 1 .  lisinopriL  (PRINIVIL ) 10 mg tablet, Take 10 mg by mouth daily., Disp: , Rfl:  .  lisinopriL  (PRINIVIL ) 5 mg tablet, Take 5 mg by mouth daily., Disp: , Rfl:  .  pantoprazole  (PROTONIX ) 40 mg EC tablet, Take 40 mg by mouth Once Daily., Disp: , Rfl:  .  predniSONE  (DELTASONE ) 5 mg tablet, Take 5 mg by mouth Once Daily., Disp: , Rfl:  .  tacrolimus  (PROGRAF ) 1 mg capsule, Take 2 mg in am and 1.5 mg in pm, Disp: , Rfl:  .  vit C,E-Zn-coppr-lutein-zeaxan (PreserVision AREDS-2) 250-90-40-1 mg cap capsule, Take 1 tablet by mouth Once Daily., Disp: , Rfl:    Physical Exam: Vital Signs: Blood pressure 134/64, pulse 81, weight 50.8 kg (112 lb), SpO2 93%. General: 68 y.o. male who is in no acute distress. HEENT: Normocephalic, atraumatic.  Dentition is as expected for age. Neck: Supple.  Heart: Regular rate and rhythm. Normal S1, S2. No murmurs,  Lungs: Clear to auscultation bilaterally.  Abdomen: Soft, non-distended. Extremities: No clubbing, cyanosis or edema. Skin: Intact. Warm and dry. No rashes or petechiae noted in exposed areas. Incision site is well healed.   Neurologic: Oriented to person, place and time. No focal deficits.   Psychologic: Normal affect. Mood is appropriate.   Diagnostics:  EKG: (personally  reviewed and interpreted) Underlying atrial flutter with ventricular rate 80 bpm paced  Available/ pertinent information reviewed including radiology imaging and reports, recent labs/microbiology and Echocardiogram.  Medical records/ EMR was thoroughly reviewed and summarized.   Assessment & Plan Permanent atrial fibrillation -Status post AV node ablation and leadless Micra implant -Set at VVIR, no evidence of free conduction  [facility]ension. -[facility] -[facility]     Plan: Continue Eliquis  No longer needs diltiazem  or metoprolol  Follow-up with [facility] cardiology Remote device checks  Follow up: Scheduled at [facility] with Dr. Clare

## 2024-01-15 ENCOUNTER — Encounter (HOSPITAL_COMMUNITY)
Admission: RE | Admit: 2024-01-15 | Discharge: 2024-01-15 | Disposition: A | Discharge status: Home / Self Care | Source: Ambulatory Visit | Attending: Critical Care Medicine | Admitting: Critical Care Medicine

## 2024-01-15 DIAGNOSIS — J449 Chronic obstructive pulmonary disease, unspecified: Secondary | ICD-10-CM | POA: Insufficient documentation

## 2024-01-15 NOTE — Progress Notes (Signed)
 Completed virtual orientation today.  EP evaluation is scheduled for 9/29 at 10:30 am .  Documentation for diagnosis can be found in [facility] encounter [facility].

## 2024-01-17 ENCOUNTER — Encounter (HOSPITAL_COMMUNITY)

## 2024-01-22 ENCOUNTER — Encounter (HOSPITAL_COMMUNITY)
Admission: RE | Admit: 2024-01-22 | Discharge: 2024-01-22 | Disposition: A | Discharge status: Home / Self Care | Source: Ambulatory Visit | Attending: Critical Care Medicine | Admitting: Critical Care Medicine

## 2024-01-22 VITALS — Ht 68.0 in | Wt 115.3 lb

## 2024-01-22 DIAGNOSIS — J449 Chronic obstructive pulmonary disease, unspecified: Secondary | ICD-10-CM | POA: Diagnosis present

## 2024-01-22 NOTE — Progress Notes (Signed)
 Pulmonary Individual Treatment Plan  Patient Details  Name: Calvin Carroll. MRN: [id number] Date of Birth: 26-Feb-1956 Referring Provider:   Flowsheet Row PULMONARY REHAB COPD ORIENTATION from 01/22/2024 in [facility] REHABILITATION  Referring Provider Marcos Capes DO    Initial Encounter Date:  Flowsheet Row PULMONARY REHAB COPD ORIENTATION from 01/22/2024 in [facility] CARDIAC REHABILITATION  Date 01/22/24    Visit Diagnosis: Chronic obstructive pulmonary disease, unspecified COPD type (HCC)  Patient's Home Medications on Admission:   Current Outpatient Medications:    apixaban  (ELIQUIS ) 5 MG TABS tablet, Take 1 tablet (5 mg total) by mouth 2 (two) times daily., Disp: 60 tablet, Rfl: 2   azithromycin (ZITHROMAX) 250 MG tablet, Take 250 mg by mouth daily., Disp: , Rfl:    Cholecalciferol 50 MCG (2000 UT) TABS, Take 1 tablet by mouth daily., Disp: , Rfl:    fexofenadine (ALLEGRA) 180 MG tablet, Take 180 mg by mouth daily as needed for allergies., Disp: , Rfl:    guaiFENesin  (MUCINEX  PO), Take 2 tablets by mouth every 12 (twelve) hours., Disp: , Rfl:    levalbuterol  (XOPENEX  HFA) 45 MCG/ACT inhaler, Inhale 2 puffs into the lungs every 6 (six) hours as needed for wheezing., Disp: 1 each, Rfl: 2   lisinopril  (ZESTRIL ) 10 MG tablet, Take 10 mg by mouth daily., Disp: , Rfl:    Mycophenolate  Sodium (MYCOPHENOLIC ACID ) 180 MG TBEC, Take 180 mg by mouth 2 (two) times daily., Disp: , Rfl:    Omega-3 Fatty Acids (FISH OIL) 1000 MG CAPS, Take 2 capsules by mouth daily., Disp: , Rfl:    Omega-3 Fatty Acids (FISH OIL) 1000 MG CAPS, Take 100 mg by mouth daily., Disp: , Rfl:    pantoprazole  (PROTONIX ) 40 MG tablet, Take 40 mg by mouth daily before supper., Disp: , Rfl:    predniSONE  (DELTASONE ) 5 MG tablet, Take 1 tablet (5 mg total) by mouth daily with breakfast. Resume this after finishing 10 mg prednisone , Disp: , Rfl:    tacrolimus  (PROGRAF ) 1 MG capsule, Take 1 mg by mouth See admin  instructions. Take 1 mg in the morning (take with 0.5 mg to equal 1.5 mg in the morning) and 2 mg in the evening, Disp: , Rfl:    tiotropium (SPIRIVA) 18 MCG inhalation capsule, Place 18 mcg into inhaler and inhale daily., Disp: , Rfl:    torsemide  (DEMADEX ) 10 MG tablet, Take 1 tablet (10 mg total) by mouth daily., Disp: 30 tablet, Rfl: 1   diltiazem  (CARDIZEM  CD) 360 MG 24 hr capsule, Take 1 capsule (360 mg total) by mouth daily. (Patient not taking: Reported on 01/22/2024), Disp: 7 capsule, Rfl: 0   flunisolide (NASALIDE) 25 MCG/ACT (0.025%) SOLN, Place 2 sprays into the nose 2 (two) times daily. (Patient not taking: Reported on 01/22/2024), Disp: , Rfl:    hydrALAZINE  (APRESOLINE ) 50 MG tablet, Take 1 tablet (50 mg total) by mouth in the morning and at bedtime. (Patient not taking: Reported on 01/22/2024), Disp: , Rfl:    metoprolol  tartrate (LOPRESSOR ) 50 MG tablet, Take 1 tablet (50 mg total) by mouth 2 (two) times daily. (Patient not taking: Reported on 01/22/2024), Disp: 60 tablet, Rfl: 1   Mometasone  Furoate 200 MCG/ACT AERO, Inhale 2 each into the lungs in the morning and at bedtime. (Patient not taking: Reported on 01/22/2024), Disp: , Rfl:    nicotine  (NICODERM CQ  - DOSED IN MG/24 HOURS) 14 mg/24hr patch, Place 1 patch (14 mg total) onto the skin daily. (Patient  not taking: Reported on 01/22/2024), Disp: 28 patch, Rfl: 0   potassium chloride SA (KLOR-CON M) 20 MEQ tablet, Take 20 mEq by mouth once. (Patient not taking: Reported on 01/22/2024), Disp: , Rfl:    tacrolimus  (PROGRAF ) 0.5 MG capsule, Take 0.5 mg by mouth See admin instructions. Take 0.5 mg daily in the morning with the 1 mg capsule to equal 1.5 mg in the morning, Disp: , Rfl:   Past Medical History: Past Medical History:  Diagnosis Date   Acute respiratory failure with hypoxia (HCC) 08/12/2022   Atrial fibrillation (HCC)    COPD (chronic obstructive pulmonary disease) (HCC)    COPD exacerbation (HCC) 05/17/2021   Hypertension     MI (myocardial infarction) (HCC)    Renal disorder     Tobacco Use: Social History   Tobacco Use  Smoking Status Every Day   Current packs/day: 1.50   Types: Cigarettes  Smokeless Tobacco Never    Labs: Review Flowsheet       Latest Ref Rng & Units 08/12/2022 10/28/2022  Labs for ITP Cardiac and Pulmonary Rehab  Hemoglobin A1c 4.8 - 5.6 % 5.4  -  Bicarbonate 20.0 - 28.0 mmol/L - 24.6   O2 Saturation % - 82     Capillary Blood Glucose: Lab Results  Component Value Date   GLUCAP 146 (H) 08/19/2022     Pulmonary Assessment Scores:  Pulmonary Assessment Scores     Row Name 01/22/24 1106         ADL UCSD   ADL Phase Entry     SOB Score total 88     Rest 2     Walk 3     Stairs 4     Bath 3     Dress 3     Shop 4       CAT Score   CAT Score 29       UCSD: Self-administered rating of dyspnea associated with activities of daily living (ADLs) 6-point scale (0 = not at all to 5 = maximal or unable to do because of breathlessness)  Scoring Scores range from 0 to 120.  Minimally important difference is 5 units  CAT: CAT can identify the health impairment of COPD patients and is better correlated with disease progression.  CAT has a scoring range of zero to 40. The CAT score is classified into four groups of low (less than 10), medium (10 - 20), high (21-30) and very high (31-40) based on the impact level of disease on health status. A CAT score over 10 suggests significant symptoms.  A worsening CAT score could be explained by an exacerbation, poor medication adherence, poor inhaler technique, or progression of COPD or comorbid conditions.  CAT MCID is 2 points  mMRC: mMRC (Modified Medical Research Council) Dyspnea Scale is used to assess the degree of baseline functional disability in patients of respiratory disease due to dyspnea. No minimal important difference is established. A decrease in score of 1 point or greater is considered a positive change.    Pulmonary Function Assessment:   Exercise Target Goals: Exercise Program Goal: Individual exercise prescription set using results from initial 6 min walk test and THRR while considering  patient's activity barriers and safety.   Exercise Prescription Goal: Initial exercise prescription builds to 30-45 minutes a day of aerobic activity, 2-3 days per week.  Home exercise guidelines will be given to patient during program as part of exercise prescription that the participant will acknowledge.  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 HR 86 bpm     Max Ex. BP 136/70     2 Minute Post BP 122/70       Interval HR   1 Minute HR 84     2 Minute HR 83     3 Minute HR 86     4 Minute HR 85     5 Minute HR 83     6 Minute HR 86     2 Minute Post HR 83     Interval Heart Rate? Yes       Interval Oxygen   Interval Oxygen? Yes     Baseline Oxygen Saturation % 97 %     1 Minute Oxygen Saturation % 96 %     1 Minute Liters of Oxygen 0 L     2 Minute Oxygen Saturation % 97 %     2 Minute Liters of Oxygen 0 L     3 Minute Oxygen Saturation % 96 %     3 Minute Liters of Oxygen 0 L     4 Minute Oxygen Saturation % 95 %     4 Minute Liters of Oxygen 0  L     5 Minute Oxygen Saturation % 92 %     5 Minute Liters of Oxygen 0 L     6 Minute Oxygen Saturation % 95 %     6 Minute Liters of Oxygen 0 L     2 Minute Post Oxygen Saturation % 95 %     2 Minute Post Liters of Oxygen 0 L        Oxygen Initial Assessment:  Oxygen Initial Assessment - 01/15/24 1318       Home Oxygen   Home Oxygen Device None    Sleep Oxygen Prescription Continuous    Liters per minute 2    Home Exercise Oxygen Prescription None    Home Resting Oxygen Prescription None    Compliance with Home Oxygen Use Yes      Intervention   Short Term Goals To learn and exhibit compliance with exercise, home and travel O2 prescription;To learn and understand importance of monitoring SPO2 with pulse oximeter and demonstrate accurate use of the pulse oximeter.;To learn and understand importance of maintaining oxygen saturations>88%;To learn and demonstrate proper pursed lip breathing techniques or other breathing  techniques. ;To learn and demonstrate proper use of respiratory medications    Long  Term Goals Exhibits compliance with exercise, home  and travel O2 prescription;Verbalizes importance of monitoring SPO2 with pulse oximeter and return demonstration;Maintenance of O2 saturations>88%;Exhibits proper breathing techniques, such as pursed lip breathing or other method taught during program session;Compliance with respiratory medication;Demonstrates proper use of MDI's          Oxygen Re-Evaluation:   Oxygen Discharge (Final Oxygen Re-Evaluation):   Initial Exercise Prescription:  Initial Exercise Prescription - 01/22/24 1100       Date of Initial Exercise RX and Referring Provider   Date 01/22/24    Referring Provider Marcos, Chard DO      Bike   Level 4    Minutes 15    METs 1.8      NuStep   Level 1    SPM 50    Minutes 15    METs 1.8      Prescription Details   Frequency (times per week) 2    Duration Progress to 30 minutes of continuous aerobic  without signs/symptoms of physical distress      Intensity   THRR 40-80% of Max Heartrate 110-139    Ratings of Perceived Exertion 11-13    Perceived Dyspnea 0-4      Resistance Training   Training Prescription Yes    Weight 3    Reps 10-15          Perform Capillary Blood Glucose checks as needed.  Exercise Prescription Changes:   Exercise Prescription Changes     Row Name 01/22/24 1100             Response to Exercise   Blood Pressure (Admit) 112/60       Blood Pressure (Exercise) 136/70       Blood Pressure (Exit) 122/70       Heart Rate (Admit) 81 bpm       Heart Rate (Exercise) 86 bpm       Heart Rate (Exit) 83 bpm       Oxygen Saturation (Admit) 97 %       Oxygen Saturation (Exercise) 92 %       Oxygen Saturation (Exit) 95 %       Rating of Perceived Exertion (Exercise) 12       Perceived Dyspnea (Exercise) 2          Exercise Comments:   Exercise Comments     Row Name 01/15/24 1318 01/22/24 1104         Exercise Comments Calvin Carroll currently goes to the [employer] a couple times a week and gets on the stationary bike. Patient attend orientation today.  Patient is attending Pulmonary Rehabilitation Program.  Documentation for diagnosis can be found in [facility].  Reviewed medical chart, RPE/RPD, gym safety, and program guidelines.  Patient was fitted to equipment they will be using during rehab.  Patient is scheduled to start exercise on 01/31/24.   Initial ITP created and sent for review and signature by Dr. Anton Kelp, Medical Director for Pulmonary Rehabilitation Program.         Exercise Goals and Review:   Exercise Goals     Row Name 01/15/24 1317             Exercise Goals   Increase Physical Activity Yes       Intervention Provide advice, education, support and counseling about physical activity/exercise needs.;Develop an individualized exercise prescription for aerobic and  resistive training based on initial evaluation findings, risk  stratification, comorbidities and participant's personal goals.       Expected Outcomes Short Term: Attend rehab on a regular basis to increase amount of physical activity.;Long Term: Exercising regularly at least 3-5 days a week.;Long Term: Add in home exercise to make exercise part of routine and to increase amount of physical activity.       Increase Strength and Stamina Yes       Intervention Provide advice, education, support and counseling about physical activity/exercise needs.;Develop an individualized exercise prescription for aerobic and resistive training based on initial evaluation findings, risk stratification, comorbidities and participant's personal goals.       Expected Outcomes Short Term: Increase workloads from initial exercise prescription for resistance, speed, and METs.;Short Term: Perform resistance training exercises routinely during rehab and add in resistance training at home;Long Term: Improve cardiorespiratory fitness, muscular endurance and strength as measured by increased METs and functional capacity ( )       Able to understand and use rate of perceived exertion (RPE) scale Yes       Intervention Provide education and explanation on how to use RPE scale       Expected Outcomes Short Term: Able to use RPE daily in rehab to express subjective intensity level;Long Term:  Able to use RPE to guide intensity level when exercising independently       Able to understand and use Dyspnea scale Yes       Intervention Provide education and explanation on how to use Dyspnea scale       Expected Outcomes Short Term: Able to use Dyspnea scale daily in rehab to express subjective sense of shortness of breath during exertion;Long Term: Able to use Dyspnea scale to guide intensity level when exercising independently       Knowledge and understanding of Target Heart Rate Range (THRR) Yes       Intervention Provide education and explanation of THRR including how the numbers were predicted  and where they are located for reference       Expected Outcomes Short Term: Able to state/look up THRR;Short Term: Able to use daily as guideline for intensity in rehab;Long Term: Able to use THRR to govern intensity when exercising independently       Able to check pulse independently Yes       Intervention Provide education and demonstration on how to check pulse in carotid and radial arteries.;Review the importance of being able to check your own pulse for safety during independent exercise       Expected Outcomes Short Term: Able to explain why pulse checking is important during independent exercise;Long Term: Able to check pulse independently and accurately       Understanding of Exercise Prescription Yes       Intervention Provide education, explanation, and written materials on patient's individual exercise prescription       Expected Outcomes Short Term: Able to explain program exercise prescription;Long Term: Able to explain home exercise prescription to exercise independently          Exercise Goals Re-Evaluation :   Discharge Exercise Prescription (Final Exercise Prescription Changes):  Exercise Prescription Changes - 01/22/24 1100       Response to Exercise   Blood Pressure (Admit) 112/60    Blood Pressure (Exercise) 136/70    Blood Pressure (Exit) 122/70    Heart Rate (Admit) 81 bpm    Heart Rate (Exercise) 86 bpm    Heart Rate (Exit)  83 bpm    Oxygen Saturation (Admit) 97 %    Oxygen Saturation (Exercise) 92 %    Oxygen Saturation (Exit) 95 %    Rating of Perceived Exertion (Exercise) 12    Perceived Dyspnea (Exercise) 2          Nutrition:  Target Goals: Understanding of nutrition guidelines, daily intake of sodium 1500mg , cholesterol 200mg , calories 30% from fat and 7% or less from saturated fats, daily to have 5 or more servings of fruits and vegetables.  Biometrics:  Pre Biometrics - 01/22/24 1134       Pre Biometrics   Height 5' 8 (1.727 m)    Weight  52.3 kg    Waist Circumference 29.5 inches    Hip Circumference 33 inches    Waist to Hip Ratio 0.89 %    BMI (Calculated) 17.54    Grip Strength 23.3 kg           Nutrition Therapy Plan and Nutrition Goals:  Nutrition Therapy & Goals - 01/15/24 1320       Intervention Plan   Intervention Nutrition handout(s) given to patient.;Prescribe, educate and counsel regarding individualized specific dietary modifications aiming towards targeted core components such as weight, hypertension, lipid management, diabetes, heart failure and other comorbidities.    Expected Outcomes Long Term Goal: Adherence to prescribed nutrition plan.;Short Term Goal: Understand basic principles of dietary content, such as calories, fat, sodium, cholesterol and nutrients.;Short Term Goal: A plan has been developed with personal nutrition goals set during dietitian appointment.          Nutrition Assessments:  Nutrition Assessments - 01/22/24 1107       Rate Your Plate Scores   Pre Score 33         MEDIFICTS Score Key: >=70 Need to make dietary changes  40-70 Heart Healthy Diet <= 40 Therapeutic Level Cholesterol Diet  Flowsheet Row PULMONARY REHAB COPD ORIENTATION from 01/22/2024 in [facility] CARDIAC REHABILITATION  Picture Your Plate Total Score on Admission 33   Picture Your Plate Scores: <59 Unhealthy dietary pattern with much room for improvement. 41-50 Dietary pattern unlikely to meet recommendations for good health and room for improvement. 51-60 More healthful dietary pattern, with some room for improvement.  >60 Healthy dietary pattern, although there may be some specific behaviors that could be improved.    Nutrition Goals Re-Evaluation:   Nutrition Goals Discharge (Final Nutrition Goals Re-Evaluation):   Psychosocial: Target Goals: Acknowledge presence or absence of significant depression and/or stress, maximize coping skills, provide positive support system. Participant is able  to verbalize types and ability to use techniques and skills needed for reducing stress and depression.  Initial Review & Psychosocial Screening:  Initial Psych Review & Screening - 01/15/24 1320       Initial Review   Current issues with Current Stress Concerns    Source of Stress Concerns Family    Comments Calvin Carroll, Mozer lost a very important member of his family around a year ago, his significant other's child, so the family is still grieving over that loss.      Family Dynamics   Good Support System? Yes    Comments He has a good support system from his significant other [place], and children/ grandchildren.      Barriers   Psychosocial barriers to participate in program There are no identifiable barriers or psychosocial needs.      Screening Interventions   Interventions Encouraged to exercise;To provide support and resources with identified psychosocial  needs    Expected Outcomes Short Term goal: Utilizing psychosocial counselor, staff and physician to assist with identification of specific Stressors or current issues interfering with healing process. Setting desired goal for each stressor or current issue identified.;Long Term Goal: Stressors or current issues are controlled or eliminated.;Short Term goal: Identification and review with participant of any Quality of Life or Depression concerns found by scoring the questionnaire.;Long Term goal: The participant improves quality of Life and PHQ9 Scores as seen by post scores and/or verbalization of changes          Quality of Life Scores:  Scores of 19 and below usually indicate a poorer quality of life in these areas.  A difference of  2-3 points is a clinically meaningful difference.  A difference of 2-3 points in the total score of the Quality of Life Index has been associated with significant improvement in overall quality of life, self-image, physical symptoms, and general health in studies assessing change in quality of  life.  PHQ-9: Review Flowsheet       01/22/2024  Depression screen PHQ 2/9  Decreased Interest 3  Down, Depressed, Hopeless 1  PHQ - 2 Score 4  Altered sleeping 1  Tired, decreased energy 2  Change in appetite 2  Feeling bad or failure about yourself  2  Trouble concentrating 1  Moving slowly or fidgety/restless 1  Suicidal thoughts 0  PHQ-9 Score 13  Difficult doing work/chores Somewhat difficult   Interpretation of Total Score  Total Score Depression Severity:  1-4 = Minimal depression, 5-9 = Mild depression, 10-14 = Moderate depression, 15-19 = Moderately severe depression, 20-27 = Severe depression   Psychosocial Evaluation and Intervention:  Psychosocial Evaluation - 01/15/24 1322       Psychosocial Evaluation & Interventions   Interventions Stress management education;Relaxation education;Encouraged to exercise with the program and follow exercise prescription    Comments Rashaad who goes by Calvin Carroll is a pleasant 68 year old male who is coming into rehab for his COPD. He has an extensive medical history including cardiac and pulmonary issues. 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 He gets around ok, but states his knees and legs are a little weak. He goes to the local gym a few days a week and rides the stationary bike. He lives with his significant other Almetta, who is the retired [employer] of [employer], so she is great support for him. He is still a current smoker with 3-4 a day. This is a major cutback from 2 packs per day back in January. He does use O2 occassionally at night time when he feels SOB before bed. He is eager to start the program.    Expected Outcomes Short: Increase strength and stamina. Long: Improve shortness of breath.    Continue Psychosocial Services  Follow up required by staff          Psychosocial Re-Evaluation:   Psychosocial Discharge (Final Psychosocial Re-Evaluation):   Education: Education Goals:  Education classes will be provided on a weekly basis, covering required topics. Participant will state understanding/return demonstration of topics presented.  Learning Barriers/Preferences:  Learning Barriers/Preferences - 01/15/24 1320       Learning Barriers/Preferences   Learning Barriers Sight   wears glasses.   Learning Preferences Audio;Skilled Demonstration;Verbal Instruction;Video;Group Instruction          Education Topics: Know Your Numbers Group instruction that is supported by a PowerPoint presentation. Instructor discusses importance of  knowing and understanding resting, exercise, and post-exercise oxygen saturation, heart rate, and blood pressure. Oxygen saturation, heart rate, blood pressure, rating of perceived exertion, and dyspnea are reviewed along with a normal range for these values.    Exercise for the Pulmonary Patient Group instruction that is supported by a PowerPoint presentation. Instructor discusses benefits of exercise, core components of exercise, frequency, duration, and intensity of an exercise routine, importance of utilizing pulse oximetry during exercise, safety while exercising, and options of places to exercise outside of rehab.    MET Level  Group instruction provided by PowerPoint, verbal discussion, and written material to support subject matter. Instructor reviews what METs are and how to increase METs.    Pulmonary Medications Verbally interactive group education provided by instructor with focus on inhaled medications and proper administration.   Anatomy and Physiology of the Respiratory System Group instruction provided by PowerPoint, verbal discussion, and written material to support subject matter. Instructor reviews respiratory cycle and anatomical components of the respiratory system and their functions. Instructor also reviews differences in obstructive and restrictive respiratory diseases with examples of each.    Oxygen  Safety Group instruction provided by PowerPoint, verbal discussion, and written material to support subject matter. There is an overview of "What is Oxygen" and "Why do we need it".  Instructor also reviews how to create a safe environment for oxygen use, the importance of using oxygen as prescribed, and the risks of noncompliance. There is a brief discussion on traveling with oxygen and resources the patient may utilize.   Oxygen Use Group instruction provided by PowerPoint, verbal discussion, and written material to discuss how supplemental oxygen is prescribed and different types of oxygen supply systems. Resources for more information are provided.    Breathing Techniques Group instruction that is supported by demonstration and informational handouts. Instructor discusses the benefits of pursed lip and diaphragmatic breathing and detailed demonstration on how to perform both.     Risk Factor Reduction Group instruction that is supported by a PowerPoint presentation. Instructor discusses the definition of a risk factor, different risk factors for pulmonary disease, and how the heart and lungs work together.   Pulmonary Diseases Group instruction provided by PowerPoint, verbal discussion, and written material to support subject matter. Instructor gives an overview of the different type of pulmonary diseases. There is also a discussion on risk factors and symptoms as well as ways to manage the diseases.   Stress and Energy Conservation Group instruction provided by PowerPoint, verbal discussion, and written material to support subject matter. Instructor gives an overview of stress and the impact it can have on the body. Instructor also reviews ways to reduce stress. There is also a discussion on energy conservation and ways to conserve energy throughout the day.   Warning Signs and Symptoms Group instruction provided by PowerPoint, verbal discussion, and written material to support subject  matter. Instructor reviews warning signs and symptoms of stroke, heart attack, cold and flu. Instructor also reviews ways to prevent the spread of infection.   Other Education Group or individual verbal, written, or video instructions that support the educational goals of the pulmonary rehab program.    Knowledge Questionnaire Score:  Knowledge Questionnaire Score - 01/22/24 1105       Knowledge Questionnaire Score   Pre Score 17/18          Core Components/Risk Factors/Patient Goals at Admission:  Personal Goals and Risk Factors at Admission - 01/15/24 1319       Core  Components/Risk Factors/Patient Goals on Admission   Tobacco Cessation Yes    Number of packs per day 3-4 cigarettes per day. Cut down from 2 packs per day in January.    Intervention Assist the participant in steps to quit. Provide individualized education and counseling about committing to Tobacco Cessation, relapse prevention, and pharmacological support that can be provided by physician.;Education officer, environmental, assist with locating and accessing local/national Quit Smoking programs, and support quit date choice.    Expected Outcomes Short Term: Will demonstrate readiness to quit, by selecting a quit date.;Short Term: Will quit all tobacco product use, adhering to prevention of relapse plan.;Long Term: Complete abstinence from all tobacco products for at least 12 months from quit date.    Improve shortness of breath with ADL's Yes    Intervention Provide education, individualized exercise plan and daily activity instruction to help decrease symptoms of SOB with activities of daily living.    Expected Outcomes Short Term: Improve cardiorespiratory fitness to achieve a reduction of symptoms when performing ADLs;Long Term: Be able to perform more ADLs without symptoms or delay the onset of symptoms    Hypertension Yes    Intervention Provide education on lifestyle modifcations including regular physical  activity/exercise, weight management, moderate sodium restriction and increased consumption of fresh fruit, vegetables, and low fat dairy, alcohol moderation, and smoking cessation.;Monitor prescription use compliance.    Expected Outcomes Short Term: Continued assessment and intervention until BP is < 140/23mm HG in hypertensive participants. < 130/25mm HG in hypertensive participants with diabetes, heart failure or chronic kidney disease.;Long Term: Maintenance of blood pressure at goal levels.    Lipids Yes    Intervention Provide education and support for participant on nutrition & aerobic/resistive exercise along with prescribed medications to achieve LDL 70mg , HDL >40mg .    Expected Outcomes Short Term: Participant states understanding of desired cholesterol values and is compliant with medications prescribed. Participant is following exercise prescription and nutrition guidelines.;Long Term: Cholesterol controlled with medications as prescribed, with individualized exercise RX and with personalized nutrition plan. Value goals: LDL < 70mg , HDL > 40 mg.          Core Components/Risk Factors/Patient Goals Review:    Core Components/Risk Factors/Patient Goals at Discharge (Final Review):    ITP Comments:  ITP Comments     Row Name 01/15/24 1316 01/22/24 1103         ITP Comments Completed virtual orientation today.  EP evaluation is scheduled for 9/29 at 10:30 am .  Documentation for diagnosis can be found in [facility] encounter [facility]. Patient attend orientation today.  Patient is attending Pulmonary Rehabilitation Program.  Documentation for diagnosis can be found in [place] [facility].  Reviewed medical chart, RPE/RPD, gym safety, and program guidelines.  Patient was fitted to equipment they will be using during rehab.  Patient is scheduled to start exercise on 01/31/24.   Initial ITP created and sent for review and signature by Dr. Anton Kelp, Medical Director for Pulmonary Rehabilitation  Program.         Comments: Initial ITP.

## 2024-01-22 NOTE — Patient Instructions (Signed)
 Patient Instructions  Patient Details  Name: Calvin Carroll. MRN: [id number] Date of Birth: [birth year] Referring Provider:  [facility], [facility]  Below are your personal goals for exercise, nutrition, and risk factors. Our goal is to help you stay on track towards obtaining and maintaining these goals. We will be discussing your progress on these goals with you throughout the program.  Initial Exercise Prescription:  Initial Exercise Prescription - 01/22/24 1100       Date of Initial Exercise RX and Referring Provider   Date 01/22/24    Referring Provider Marcos, Chard DO      Bike   Level 4    Minutes 15    METs 1.8      NuStep   Level 1    SPM 50    Minutes 15    METs 1.8      Prescription Details   Frequency (times per week) 2    Duration Progress to 30 minutes of continuous aerobic without signs/symptoms of physical distress      Intensity   THRR 40-80% of Max Heartrate 110-139    Ratings of Perceived Exertion 11-13    Perceived Dyspnea 0-4      Resistance Training   Training Prescription Yes    Weight 3    Reps 10-15          Exercise Goals: Frequency: Be able to perform aerobic exercise two to three times per week in program working toward 2-5 days per week of home exercise.  Intensity: Work with a perceived exertion of 11 (fairly light) - 15 (hard) while following your exercise prescription.  We will make changes to your prescription with you as you progress through the program.   Duration: Be able to do 30 to 45 minutes of continuous aerobic exercise in addition to a 5 minute warm-up and a 5 minute cool-down routine.   Nutrition Goals: Your personal nutrition goals will be established when you do your nutrition analysis with the dietician.  The following are general nutrition guidelines to follow: Cholesterol < 200mg /day Sodium < 1500mg /day Fiber: Men over 50 yrs - 30 grams per day  Personal Goals:  Personal Goals and Risk Factors at  Admission - 01/15/24 1319       Core Components/Risk Factors/Patient Goals on Admission   Tobacco Cessation Yes    Number of packs per day 3-4 cigarettes per day. Cut down from 2 packs per day in January.    Intervention Assist the participant in steps to quit. Provide individualized education and counseling about committing to Tobacco Cessation, relapse prevention, and pharmacological support that can be provided by physician.;Education officer, environmental, assist with locating and accessing local/national Quit Smoking programs, and support quit date choice.    Expected Outcomes Short Term: Will demonstrate readiness to quit, by selecting a quit date.;Short Term: Will quit all tobacco product use, adhering to prevention of relapse plan.;Long Term: Complete abstinence from all tobacco products for at least 12 months from quit date.    Improve shortness of breath with ADL's Yes    Intervention Provide education, individualized exercise plan and daily activity instruction to help decrease symptoms of SOB with activities of daily living.    Expected Outcomes Short Term: Improve cardiorespiratory fitness to achieve a reduction of symptoms when performing ADLs;Long Term: Be able to perform more ADLs without symptoms or delay the onset of symptoms    Hypertension Yes    Intervention Provide education on lifestyle modifcations including regular physical  activity/exercise, weight management, moderate sodium restriction and increased consumption of fresh fruit, vegetables, and low fat dairy, alcohol moderation, and smoking cessation.;Monitor prescription use compliance.    Expected Outcomes Short Term: Continued assessment and intervention until BP is < 140/58mm HG in hypertensive participants. < 130/62mm HG in hypertensive participants with diabetes, heart failure or chronic kidney disease.;Long Term: Maintenance of blood pressure at goal levels.    Lipids Yes    Intervention Provide education and support for  participant on nutrition & aerobic/resistive exercise along with prescribed medications to achieve LDL 70mg , HDL >40mg .    Expected Outcomes Short Term: Participant states understanding of desired cholesterol values and is compliant with medications prescribed. Participant is following exercise prescription and nutrition guidelines.;Long Term: Cholesterol controlled with medications as prescribed, with individualized exercise RX and with personalized nutrition plan. Value goals: LDL < 70mg , HDL > 40 mg.          Tobacco Use Initial Evaluation: Social History   Tobacco Use  Smoking Status Every Day   Current packs/day: 1.50   Types: Cigarettes  Smokeless Tobacco Never    Exercise Goals and Review:  Exercise Goals     Row Name 01/15/24 1317             Exercise Goals   Increase Physical Activity Yes       Intervention Provide advice, education, support and counseling about physical activity/exercise needs.;Develop an individualized exercise prescription for aerobic and resistive training based on initial evaluation findings, risk stratification, comorbidities and participant's personal goals.       Expected Outcomes Short Term: Attend rehab on a regular basis to increase amount of physical activity.;Long Term: Exercising regularly at least 3-5 days a week.;Long Term: Add in home exercise to make exercise part of routine and to increase amount of physical activity.       Increase Strength and Stamina Yes       Intervention Provide advice, education, support and counseling about physical activity/exercise needs.;Develop an individualized exercise prescription for aerobic and resistive training based on initial evaluation findings, risk stratification, comorbidities and participant's personal goals.       Expected Outcomes Short Term: Increase workloads from initial exercise prescription for resistance, speed, and METs.;Short Term: Perform resistance training exercises routinely during  rehab and add in resistance training at home;Long Term: Improve cardiorespiratory fitness, muscular endurance and strength as measured by increased METs and functional capacity ( )       Able to understand and use rate of perceived exertion (RPE) scale Yes       Intervention Provide education and explanation on how to use RPE scale       Expected Outcomes Short Term: Able to use RPE daily in rehab to express subjective intensity level;Long Term:  Able to use RPE to guide intensity level when exercising independently       Able to understand and use Dyspnea scale Yes       Intervention Provide education and explanation on how to use Dyspnea scale       Expected Outcomes Short Term: Able to use Dyspnea scale daily in rehab to express subjective sense of shortness of breath during exertion;Long Term: Able to use Dyspnea scale to guide intensity level when exercising independently       Knowledge and understanding of Target Heart Rate Range (THRR) Yes       Intervention Provide education and explanation of THRR including how the numbers were predicted and where they are located for reference  Expected Outcomes Short Term: Able to state/look up THRR;Short Term: Able to use daily as guideline for intensity in rehab;Long Term: Able to use THRR to govern intensity when exercising independently       Able to check pulse independently Yes       Intervention Provide education and demonstration on how to check pulse in carotid and radial arteries.;Review the importance of being able to check your own pulse for safety during independent exercise       Expected Outcomes Short Term: Able to explain why pulse checking is important during independent exercise;Long Term: Able to check pulse independently and accurately       Understanding of Exercise Prescription Yes       Intervention Provide education, explanation, and written materials on patient's individual exercise prescription       Expected Outcomes  Short Term: Able to explain program exercise prescription;Long Term: Able to explain home exercise prescription to exercise independently          Copy of goals given to participant.

## 2024-01-22 NOTE — Progress Notes (Signed)
 Patient attend orientation today.  Patient is attending Pulmonary Rehabilitation Program.  Documentation for diagnosis can be found in [place] [facility].  Reviewed medical chart, RPE/RPD, gym safety, and program guidelines.  Patient was fitted to equipment they will be using during rehab.  Patient is scheduled to start exercise on 01/31/24.   Initial ITP created and sent for review and signature by Dr. Anton Kelp, Medical Director for Pulmonary Rehabilitation Program.

## 2024-01-31 ENCOUNTER — Encounter (HOSPITAL_COMMUNITY): Payer: Self-pay | Admitting: *Deleted

## 2024-01-31 ENCOUNTER — Encounter (HOSPITAL_COMMUNITY)
Admission: RE | Admit: 2024-01-31 | Discharge: 2024-01-31 | Disposition: A | Discharge status: Home / Self Care | Source: Ambulatory Visit | Attending: Critical Care Medicine | Admitting: Critical Care Medicine

## 2024-01-31 DIAGNOSIS — J449 Chronic obstructive pulmonary disease, unspecified: Secondary | ICD-10-CM | POA: Diagnosis present

## 2024-01-31 NOTE — Progress Notes (Signed)
 Daily Session Note  Patient Details  Name: Calvin Carroll. MRN: [id number] Date of Birth: 1955-12-31 Referring Provider:   Flowsheet Row PULMONARY REHAB COPD ORIENTATION from 01/22/2024 in [facility]  Referring Provider Marcos Capes DO    Encounter Date: 01/31/2024  Check In:  Session Check In - 01/31/24 1034       Check-In   Supervising physician immediately available to respond to emergencies See telemetry face sheet for immediately available MD    Location [facility]    Staff Present Laymon Rattler, BSN, RN, WTA-C;Heather Con, BS, Exercise Physiologist;Jessica Vonzell, MA, RCEP, CCRP, CCET    Virtual Visit No    Medication changes reported     No    Fall or balance concerns reported    No    Tobacco Cessation No Change    Warm-up and Cool-down Performed on first and last piece of equipment    Resistance Training Performed Yes    VAD Patient? No    PAD/SET Patient? No      Pain Assessment   Currently in Pain? No/denies          Capillary Blood Glucose: No results found for this or any previous visit (from the past 24 hours).    Social History   Tobacco Use  Smoking Status Every Day   Current packs/day: 1.50   Types: Cigarettes  Smokeless Tobacco Never    Goals Met:  Proper associated with RPD/PD & O2 Sat Independence with exercise equipment Improved SOB with ADL's Using PLB without cueing & demonstrates good technique Exercise tolerated well No report of concerns or symptoms today Strength training completed today  Goals Unmet:  Not Applicable  Comments: First full day of exercise!  Patient was oriented to gym and equipment including functions, settings, policies, and procedures.  Patient's individual exercise prescription and treatment plan were reviewed.  All starting workloads were established based on the results of the 6 minute walk test done at initial orientation visit.  The plan for exercise  progression was also introduced and progression will be customized based on patient's performance and goals.

## 2024-01-31 NOTE — Progress Notes (Signed)
 Pulmonary Individual Treatment Plan  Patient Details  Name: Calvin Carroll. MRN: [id number] Date of Birth: 07/19/1955 Referring Provider:   Flowsheet Row PULMONARY REHAB COPD ORIENTATION from 01/22/2024 in [facility] CARDIAC REHABILITATION  Referring Provider Marcos Capes DO    Initial Encounter Date:  Flowsheet Row PULMONARY REHAB COPD ORIENTATION from 01/22/2024 in [facility] CARDIAC REHABILITATION  Date 01/22/24    Visit Diagnosis: Chronic obstructive pulmonary disease, unspecified COPD type (HCC)  Patient's Home Medications on Admission:   Current Outpatient Medications:    apixaban  (ELIQUIS ) 5 MG TABS tablet, Take 1 tablet (5 mg total) by mouth 2 (two) times daily., Disp: 60 tablet, Rfl: 2   azithromycin (ZITHROMAX) 250 MG tablet, Take 250 mg by mouth daily., Disp: , Rfl:    Cholecalciferol 50 MCG (2000 UT) TABS, Take 1 tablet by mouth daily., Disp: , Rfl:    diltiazem  (CARDIZEM  CD) 360 MG 24 hr capsule, Take 1 capsule (360 mg total) by mouth daily. (Patient not taking: Reported on 01/22/2024), Disp: 7 capsule, Rfl: 0   fexofenadine (ALLEGRA) 180 MG tablet, Take 180 mg by mouth daily as needed for allergies., Disp: , Rfl:    flunisolide (NASALIDE) 25 MCG/ACT (0.025%) SOLN, Place 2 sprays into the nose 2 (two) times daily. (Patient not taking: Reported on 01/22/2024), Disp: , Rfl:    guaiFENesin  (MUCINEX  PO), Take 2 tablets by mouth every 12 (twelve) hours., Disp: , Rfl:    hydrALAZINE  (APRESOLINE ) 50 MG tablet, Take 1 tablet (50 mg total) by mouth in the morning and at bedtime. (Patient not taking: Reported on 01/22/2024), Disp: , Rfl:    levalbuterol  (XOPENEX  HFA) 45 MCG/ACT inhaler, Inhale 2 puffs into the lungs every 6 (six) hours as needed for wheezing., Disp: 1 each, Rfl: 2   lisinopril  (ZESTRIL ) 10 MG tablet, Take 10 mg by mouth daily., Disp: , Rfl:    metoprolol  tartrate (LOPRESSOR ) 50 MG tablet, Take 1 tablet (50 mg total) by mouth 2 (two) times daily. (Patient not taking:  Reported on 01/22/2024), Disp: 60 tablet, Rfl: 1   Mometasone  Furoate 200 MCG/ACT AERO, Inhale 2 each into the lungs in the morning and at bedtime. (Patient not taking: Reported on 01/22/2024), Disp: , Rfl:    Mycophenolate  Sodium (MYCOPHENOLIC ACID ) 180 MG TBEC, Take 180 mg by mouth 2 (two) times daily., Disp: , Rfl:    nicotine  (NICODERM CQ  - DOSED IN MG/24 HOURS) 14 mg/24hr patch, Place 1 patch (14 mg total) onto the skin daily. (Patient not taking: Reported on 01/22/2024), Disp: 28 patch, Rfl: 0   Omega-3 Fatty Acids (FISH OIL) 1000 MG CAPS, Take 2 capsules by mouth daily., Disp: , Rfl:    Omega-3 Fatty Acids (FISH OIL) 1000 MG CAPS, Take 100 mg by mouth daily., Disp: , Rfl:    pantoprazole  (PROTONIX ) 40 MG tablet, Take 40 mg by mouth daily before supper., Disp: , Rfl:    potassium chloride SA (KLOR-CON M) 20 MEQ tablet, Take 20 mEq by mouth once. (Patient not taking: Reported on 01/22/2024), Disp: , Rfl:    predniSONE  (DELTASONE ) 5 MG tablet, Take 1 tablet (5 mg total) by mouth daily with breakfast. Resume this after finishing 10 mg prednisone , Disp: , Rfl:    tacrolimus  (PROGRAF ) 0.5 MG capsule, Take 0.5 mg by mouth See admin instructions. Take 0.5 mg daily in the morning with the 1 mg capsule to equal 1.5 mg in the morning, Disp: , Rfl:    tacrolimus  (PROGRAF ) 1 MG capsule, Take 1  mg by mouth See admin instructions. Take 1 mg in the morning (take with 0.5 mg to equal 1.5 mg in the morning) and 2 mg in the evening, Disp: , Rfl:    tiotropium (SPIRIVA) 18 MCG inhalation capsule, Place 18 mcg into inhaler and inhale daily., Disp: , Rfl:    torsemide  (DEMADEX ) 10 MG tablet, Take 1 tablet (10 mg total) by mouth daily., Disp: 30 tablet, Rfl: 1  Past Medical History: Past Medical History:  Diagnosis Date   Acute respiratory failure with hypoxia (HCC) 08/12/2022   Atrial fibrillation (HCC)    COPD (chronic obstructive pulmonary disease) (HCC)    COPD exacerbation (HCC) 05/17/2021   Hypertension     MI (myocardial infarction) (HCC)    Renal disorder     Tobacco Use: Social History   Tobacco Use  Smoking Status Every Day   Current packs/day: 1.50   Types: Cigarettes  Smokeless Tobacco Never    Labs: Review Flowsheet       Latest Ref Rng & Units 08/12/2022 10/28/2022  Labs for ITP Cardiac and Pulmonary Rehab  Hemoglobin A1c 4.8 - 5.6 % 5.4  -  Bicarbonate 20.0 - 28.0 mmol/L - 24.6   O2 Saturation % - 82     Capillary Blood Glucose: Lab Results  Component Value Date   GLUCAP 146 (H) 08/19/2022     Pulmonary Assessment Scores:  Pulmonary Assessment Scores     Row Name 01/22/24 1106         ADL UCSD   ADL Phase Entry     SOB Score total 88     Rest 2     Walk 3     Stairs 4     Bath 3     Dress 3     Shop 4       CAT Score   CAT Score 29       UCSD: Self-administered rating of dyspnea associated with activities of daily living (ADLs) 6-point scale (0 = not at all to 5 = maximal or unable to do because of breathlessness)  Scoring Scores range from 0 to 120.  Minimally important difference is 5 units  CAT: CAT can identify the health impairment of COPD patients and is better correlated with disease progression.  CAT has a scoring range of zero to 40. The CAT score is classified into four groups of low (less than 10), medium (10 - 20), high (21-30) and very high (31-40) based on the impact level of disease on health status. A CAT score over 10 suggests significant symptoms.  A worsening CAT score could be explained by an exacerbation, poor medication adherence, poor inhaler technique, or progression of COPD or comorbid conditions.  CAT MCID is 2 points  mMRC: mMRC (Modified Medical Research Council) Dyspnea Scale is used to assess the degree of baseline functional disability in patients of respiratory disease due to dyspnea. No minimal important difference is established. A decrease in score of 1 point or greater is considered a positive change.    Pulmonary Function Assessment:   Exercise Target Goals: Exercise Program Goal: Individual exercise prescription set using results from initial 6 min walk test and THRR while considering  patient's activity barriers and safety.   Exercise Prescription Goal: Initial exercise prescription builds to 30-45 minutes a day of aerobic activity, 2-3 days per week.  Home exercise guidelines will be given to patient during program as part of exercise prescription that the participant will acknowledge.  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 HR 86 bpm     Max Ex. BP 136/70     2 Minute Post BP 122/70       Interval HR   1 Minute HR 84     2 Minute HR 83     3 Minute HR 86     4 Minute HR 85     5 Minute HR 83     6 Minute HR 86     2 Minute Post HR 83     Interval Heart Rate? Yes       Interval Oxygen   Interval Oxygen? Yes     Baseline Oxygen Saturation % 97 %     1 Minute Oxygen Saturation % 96 %     1 Minute Liters of Oxygen 0 L     2 Minute Oxygen Saturation % 97 %     2 Minute Liters of Oxygen 0 L     3 Minute Oxygen Saturation % 96 %     3 Minute Liters of Oxygen 0 L     4 Minute Oxygen Saturation % 95 %     4 Minute Liters of Oxygen 0  L     5 Minute Oxygen Saturation % 92 %     5 Minute Liters of Oxygen 0 L     6 Minute Oxygen Saturation % 95 %     6 Minute Liters of Oxygen 0 L     2 Minute Post Oxygen Saturation % 95 %     2 Minute Post Liters of Oxygen 0 L        Oxygen Initial Assessment:  Oxygen Initial Assessment - 01/15/24 1318       Home Oxygen   Home Oxygen Device None    Sleep Oxygen Prescription Continuous    Liters per minute 2    Home Exercise Oxygen Prescription None    Home Resting Oxygen Prescription None    Compliance with Home Oxygen Use Yes      Intervention   Short Term Goals To learn and exhibit compliance with exercise, home and travel O2 prescription;To learn and understand importance of monitoring SPO2 with pulse oximeter and demonstrate accurate use of the pulse oximeter.;To learn and understand importance of maintaining oxygen saturations>88%;To learn and demonstrate proper pursed lip breathing techniques or other breathing  techniques. ;To learn and demonstrate proper use of respiratory medications    Long  Term Goals Exhibits compliance with exercise, home  and travel O2 prescription;Verbalizes importance of monitoring SPO2 with pulse oximeter and return demonstration;Maintenance of O2 saturations>88%;Exhibits proper breathing techniques, such as pursed lip breathing or other method taught during program session;Compliance with respiratory medication;Demonstrates proper use of MDI's          Oxygen Re-Evaluation:  Oxygen Re-Evaluation     Row Name 01/31/24 1035             Goals/Expected Outcomes   Comments Reviewed PLB technique with pt.  Talked about how it works and it's importance in maintaining their exercise saturations.       Goals/Expected Outcomes Short: Become more profiecient at using PLB.   Long: Become independent at using PLB.          Oxygen Discharge (Final Oxygen Re-Evaluation):  Oxygen Re-Evaluation - 01/31/24 1035       Goals/Expected Outcomes    Comments Reviewed PLB technique with pt.  Talked about how it works and it's importance in maintaining their exercise saturations.    Goals/Expected Outcomes Short: Become more profiecient at using PLB.   Long: Become independent at using PLB.          Initial Exercise Prescription:  Initial Exercise Prescription - 01/22/24 1100       Date of Initial Exercise RX and Referring Provider   Date 01/22/24    Referring Provider Marcos, Chard DO      Bike   Level 4    Minutes 15    METs 1.8      NuStep   Level 1    SPM 50    Minutes 15    METs 1.8      Prescription Details   Frequency (times per week) 2    Duration Progress to 30 minutes of continuous aerobic without signs/symptoms of physical distress      Intensity   THRR 40-80% of Max Heartrate 110-139    Ratings of Perceived Exertion 11-13    Perceived Dyspnea 0-4      Resistance Training   Training Prescription Yes    Weight 3    Reps 10-15          Perform Capillary Blood Glucose checks as needed.  Exercise Prescription Changes:   Exercise Prescription Changes     Row Name 01/22/24 1100 01/31/24 1300           Response to Exercise   Blood Pressure (Admit) 112/60 110/60      Blood Pressure (Exercise) 136/70 140/80      Blood Pressure (Exit) 122/70 128/68      Heart Rate (Admit) 81 bpm 69 bpm      Heart Rate (Exercise) 86 bpm 78 bpm      Heart Rate (Exit) 83 bpm 71 bpm      Oxygen Saturation (Admit) 97 % 97 %      Oxygen Saturation (Exercise) 92 % 98 %      Oxygen Saturation (Exit) 95 % 94 %      Rating of Perceived Exertion (Exercise) 12 15      Perceived Dyspnea (Exercise) 2 2      Duration -- Continue with 30 min of aerobic exercise without signs/symptoms of physical distress.      Intensity -- THRR unchanged        Progression   Progression -- Continue to progress workloads to maintain intensity  without signs/symptoms of physical distress.        Resistance Training   Training Prescription --  Yes      Weight -- 3      Reps -- 10-15        Bike   Level -- 3      Minutes -- 15      METs -- 1.8        NuStep   Level -- 1      SPM -- 65      Minutes -- 15      METs -- 1.6         Exercise Comments:   Exercise Comments     Row Name 01/15/24 1318 01/22/24 1104 01/31/24 1034       Exercise Comments Calvin Carroll currently goes to the [employer] a couple times a week and gets on the stationary bike. Patient attend orientation today.  Patient is attending Pulmonary Rehabilitation Program.  Documentation for diagnosis can be found in [facility].  Reviewed medical chart, RPE/RPD, gym safety, and program guidelines.  Patient was fitted to equipment they will be using during rehab.  Patient is scheduled to start exercise on 01/31/24.   Initial ITP created and sent for review and signature by Dr. Anton Kelp, Medical Director for Pulmonary Rehabilitation Program. First full day of exercise!  Patient was oriented to gym and equipment including functions, settings, policies, and procedures.  Patient's individual exercise prescription and treatment plan were reviewed.  All starting workloads were established based on the results of the 6 minute walk test done at initial orientation visit.  The plan for exercise progression was also introduced and progression will be customized based on patient's performance and goals.        Exercise Goals and Review:   Exercise Goals     Row Name 01/15/24 1317             Exercise Goals   Increase Physical Activity Yes       Intervention Provide advice, education, support and counseling about physical activity/exercise needs.;Develop an individualized exercise prescription for aerobic and resistive training based on initial evaluation findings, risk stratification, comorbidities and participant's personal goals.       Expected Outcomes Short Term: Attend rehab on a regular basis to increase amount of physical activity.;Long Term: Exercising regularly  at least 3-5 days a week.;Long Term: Add in home exercise to make exercise part of routine and to increase amount of physical activity.       Increase Strength and Stamina Yes       Intervention Provide advice, education, support and counseling about physical activity/exercise needs.;Develop an individualized exercise prescription for aerobic and resistive training based on initial evaluation findings, risk stratification, comorbidities and participant's personal goals.       Expected Outcomes Short Term: Increase workloads from initial exercise prescription for resistance, speed, and METs.;Short Term: Perform resistance training exercises routinely during rehab and add in resistance training at home;Long Term: Improve cardiorespiratory fitness, muscular endurance and strength as measured by increased METs and functional capacity ( )       Able to understand and use rate of perceived exertion (RPE) scale Yes       Intervention Provide education and explanation on how to use RPE scale       Expected Outcomes Short Term: Able to use RPE daily in rehab to express subjective intensity level;Long Term:  Able to use RPE to guide intensity level when exercising  independently       Able to understand and use Dyspnea scale Yes       Intervention Provide education and explanation on how to use Dyspnea scale       Expected Outcomes Short Term: Able to use Dyspnea scale daily in rehab to express subjective sense of shortness of breath during exertion;Long Term: Able to use Dyspnea scale to guide intensity level when exercising independently       Knowledge and understanding of Target Heart Rate Range (THRR) Yes       Intervention Provide education and explanation of THRR including how the numbers were predicted and where they are located for reference       Expected Outcomes Short Term: Able to state/look up THRR;Short Term: Able to use daily as guideline for intensity in rehab;Long Term: Able to use THRR to  govern intensity when exercising independently       Able to check pulse independently Yes       Intervention Provide education and demonstration on how to check pulse in carotid and radial arteries.;Review the importance of being able to check your own pulse for safety during independent exercise       Expected Outcomes Short Term: Able to explain why pulse checking is important during independent exercise;Long Term: Able to check pulse independently and accurately       Understanding of Exercise Prescription Yes       Intervention Provide education, explanation, and written materials on patient's individual exercise prescription       Expected Outcomes Short Term: Able to explain program exercise prescription;Long Term: Able to explain home exercise prescription to exercise independently          Exercise Goals Re-Evaluation :  Exercise Goals Re-Evaluation     Row Name 01/31/24 1035 01/31/24 1337           Exercise Goal Re-Evaluation   Exercise Goals Review Increase Physical Activity;Increase Strength and Stamina;Able to understand and use Dyspnea scale;Able to understand and use rate of perceived exertion (RPE) scale;Knowledge and understanding of Target Heart Rate Range (THRR);Able to check pulse independently;Understanding of Exercise Prescription Increase Physical Activity;Increase Strength and Stamina;Understanding of Exercise Prescription      Comments Reviewed RPE and dyspnea scale, THR and program prescription with pt today.  Pt voiced understanding and was given a copy of goals to take home. Calvin Carroll has completed his first session of PR. He is deconditioned and will need to take multiple breaks on the equipment. He did well today and pushed through. Will continue to monitor and progress as able.      Expected Outcomes Short: Use RPE daily to regulate intensity.  Long: Follow program prescription in THR. Short: get use to equipment   long: continue to attend rehab         Discharge  Exercise Prescription (Final Exercise Prescription Changes):  Exercise Prescription Changes - 01/31/24 1300       Response to Exercise   Blood Pressure (Admit) 110/60    Blood Pressure (Exercise) 140/80    Blood Pressure (Exit) 128/68    Heart Rate (Admit) 69 bpm    Heart Rate (Exercise) 78 bpm    Heart Rate (Exit) 71 bpm    Oxygen Saturation (Admit) 97 %    Oxygen Saturation (Exercise) 98 %    Oxygen Saturation (Exit) 94 %    Rating of Perceived Exertion (Exercise) 15    Perceived Dyspnea (Exercise) 2    Duration Continue with 30  min of aerobic exercise without signs/symptoms of physical distress.    Intensity THRR unchanged      Progression   Progression Continue to progress workloads to maintain intensity without signs/symptoms of physical distress.      Resistance Training   Training Prescription Yes    Weight 3    Reps 10-15      Bike   Level 3    Minutes 15    METs 1.8      NuStep   Level 1    SPM 65    Minutes 15    METs 1.6          Nutrition:  Target Goals: Understanding of nutrition guidelines, daily intake of sodium 1500mg , cholesterol 200mg , calories 30% from fat and 7% or less from saturated fats, daily to have 5 or more servings of fruits and vegetables.  Biometrics:  Pre Biometrics - 01/22/24 1134       Pre Biometrics   Height 5' 8 (1.727 m)    Weight 115 lb 4.8 oz (52.3 kg)    Waist Circumference 29.5 inches    Hip Circumference 33 inches    Waist to Hip Ratio 0.89 %    BMI (Calculated) 17.54    Grip Strength 23.3 kg           Nutrition Therapy Plan and Nutrition Goals:  Nutrition Therapy & Goals - 01/15/24 1320       Intervention Plan   Intervention Nutrition handout(s) given to patient.;Prescribe, educate and counsel regarding individualized specific dietary modifications aiming towards targeted core components such as weight, hypertension, lipid management, diabetes, heart failure and other comorbidities.    Expected Outcomes  Long Term Goal: Adherence to prescribed nutrition plan.;Short Term Goal: Understand basic principles of dietary content, such as calories, fat, sodium, cholesterol and nutrients.;Short Term Goal: A plan has been developed with personal nutrition goals set during dietitian appointment.          Nutrition Assessments:  Nutrition Assessments - 01/22/24 1107       Rate Your Plate Scores   Pre Score 33         MEDIFICTS Score Key: >=70 Need to make dietary changes  40-70 Heart Healthy Diet <= 40 Therapeutic Level Cholesterol Diet  Flowsheet Row PULMONARY REHAB COPD ORIENTATION from 01/22/2024 in [facility] CARDIAC REHABILITATION  Picture Your Plate Total Score on Admission 33   Picture Your Plate Scores: <59 Unhealthy dietary pattern with much room for improvement. 41-50 Dietary pattern unlikely to meet recommendations for good health and room for improvement. 51-60 More healthful dietary pattern, with some room for improvement.  >60 Healthy dietary pattern, although there may be some specific behaviors that could be improved.    Nutrition Goals Re-Evaluation:   Nutrition Goals Discharge (Final Nutrition Goals Re-Evaluation):   Psychosocial: Target Goals: Acknowledge presence or absence of significant depression and/or stress, maximize coping skills, provide positive support system. Participant is able to verbalize types and ability to use techniques and skills needed for reducing stress and depression.  Initial Review & Psychosocial Screening:  Initial Psych Review & Screening - 01/15/24 1320       Initial Review   Current issues with Current Stress Concerns    Source of Stress Concerns Family    Comments Calvin Carroll, Calvin Carroll lost a very important member of his family around a year ago, his significant other's child, so the family is still grieving over that loss.      Family Dynamics  Good Support System? Yes    Comments He has a good support system from his significant  other [place], and children/ grandchildren.      Barriers   Psychosocial barriers to participate in program There are no identifiable barriers or psychosocial needs.      Screening Interventions   Interventions Encouraged to exercise;To provide support and resources with identified psychosocial needs    Expected Outcomes Short Term goal: Utilizing psychosocial counselor, staff and physician to assist with identification of specific Stressors or current issues interfering with healing process. Setting desired goal for each stressor or current issue identified.;Long Term Goal: Stressors or current issues are controlled or eliminated.;Short Term goal: Identification and review with participant of any Quality of Life or Depression concerns found by scoring the questionnaire.;Long Term goal: The participant improves quality of Life and PHQ9 Scores as seen by post scores and/or verbalization of changes          Quality of Life Scores:  Scores of 19 and below usually indicate a poorer quality of life in these areas.  A difference of  2-3 points is a clinically meaningful difference.  A difference of 2-3 points in the total score of the Quality of Life Index has been associated with significant improvement in overall quality of life, self-image, physical symptoms, and general health in studies assessing change in quality of life.   PHQ-9: Review Flowsheet       01/22/2024  Depression screen PHQ 2/9  Decreased Interest 3  Down, Depressed, Hopeless 1  PHQ - 2 Score 4  Altered sleeping 1  Tired, decreased energy 2  Change in appetite 2  Feeling bad or failure about yourself  2  Trouble concentrating 1  Moving slowly or fidgety/restless 1  Suicidal thoughts 0  PHQ-9 Score 13  Difficult doing work/chores Somewhat difficult   Interpretation of Total Score  Total Score Depression Severity:  1-4 = Minimal depression, 5-9 = Mild depression, 10-14 = Moderate depression, 15-19 = Moderately severe  depression, 20-27 = Severe depression   Psychosocial Evaluation and Intervention:  Psychosocial Evaluation - 01/15/24 1322       Psychosocial Evaluation & Interventions   Interventions Stress management education;Relaxation education;Encouraged to exercise with the program and follow exercise prescription    Comments Calvin Carroll who goes by Calvin Carroll is a pleasant 68 year old male who is coming into rehab for his COPD. He has an extensive medical history including cardiac and pulmonary issues. 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 He gets around ok, but states his knees and legs are a little weak. He goes to the local gym a few days a week and rides the stationary bike. He lives with his significant other Calvin Carroll, who is the retired [employer] of [employer], so she is great support for him. He is still a current smoker with 3-4 a day. This is a major cutback from 2 packs per day back in January. He does use O2 occassionally at night time when he feels SOB before bed. He is eager to start the program.    Expected Outcomes Short: Increase strength and stamina. Long: Improve shortness of breath.    Continue Psychosocial Services  Follow up required by staff          Psychosocial Re-Evaluation:   Psychosocial Discharge (Final Psychosocial Re-Evaluation):    Education: Education Goals: Education classes will be provided on a weekly basis, covering required topics. Participant will state  understanding/return demonstration of topics presented.  Learning Barriers/Preferences:  Learning Barriers/Preferences - 01/15/24 1320       Learning Barriers/Preferences   Learning Barriers Sight   wears glasses.   Learning Preferences Audio;Skilled Demonstration;Verbal Instruction;Video;Group Instruction          Education Topics: How Lungs Work and Diseases: - Discuss the anatomy of the lungs and diseases that can affect the lungs, such as COPD.   Exercise: -Discuss  the importance of exercise, FITT principles of exercise, normal and abnormal responses to exercise, and how to exercise safely.   Environmental Irritants: -Discuss types of environmental irritants and how to limit exposure to environmental irritants.   Meds/Inhalers and oxygen: - Discuss respiratory medications, definition of an inhaler and oxygen, and the proper way to use an inhaler and oxygen.   Energy Saving Techniques: - Discuss methods to conserve energy and decrease shortness of breath when performing activities of daily living.    Bronchial Hygiene / Breathing Techniques: - Discuss breathing mechanics, pursed-lip breathing technique,  proper posture, effective ways to clear airways, and other functional breathing techniques   Cleaning Equipment: - Provides group verbal and written instruction about the health risks of elevated stress, cause of high stress, and healthy ways to reduce stress.   Nutrition I: Fats: - Discuss the types of cholesterol, what cholesterol does to the body, and how cholesterol levels can be controlled.   Nutrition II: Labels: -Discuss the different components of food labels and how to read food labels.   Respiratory Infections: - Discuss the signs and symptoms of respiratory infections, ways to prevent respiratory infections, and the importance of seeking medical treatment when having a respiratory infection.   Stress I: Signs and Symptoms: - Discuss the causes of stress, how stress may lead to anxiety and depression, and ways to limit stress.   Stress II: Relaxation: -Discuss relaxation techniques to limit stress.   Oxygen for Home/Travel: - Discuss how to prepare for travel when on oxygen and proper ways to transport and store oxygen to ensure safety.   Knowledge Questionnaire Score:  Knowledge Questionnaire Score - 01/22/24 1105       Knowledge Questionnaire Score   Pre Score 17/18          Core Components/Risk  Factors/Patient Goals at Admission:  Personal Goals and Risk Factors at Admission - 01/15/24 1319       Core Components/Risk Factors/Patient Goals on Admission   Tobacco Cessation Yes    Number of packs per day 3-4 cigarettes per day. Cut down from 2 packs per day in January.    Intervention Assist the participant in steps to quit. Provide individualized education and counseling about committing to Tobacco Cessation, relapse prevention, and pharmacological support that can be provided by physician.;Education officer, environmental, assist with locating and accessing local/national Quit Smoking programs, and support quit date choice.    Expected Outcomes Short Term: Will demonstrate readiness to quit, by selecting a quit date.;Short Term: Will quit all tobacco product use, adhering to prevention of relapse plan.;Long Term: Complete abstinence from all tobacco products for at least 12 months from quit date.    Improve shortness of breath with ADL's Yes    Intervention Provide education, individualized exercise plan and daily activity instruction to help decrease symptoms of SOB with activities of daily living.    Expected Outcomes Short Term: Improve cardiorespiratory fitness to achieve a reduction of symptoms when performing ADLs;Long Term: Be able to perform more ADLs without symptoms or delay the  onset of symptoms    Hypertension Yes    Intervention Provide education on lifestyle modifcations including regular physical activity/exercise, weight management, moderate sodium restriction and increased consumption of fresh fruit, vegetables, and low fat dairy, alcohol moderation, and smoking cessation.;Monitor prescription use compliance.    Expected Outcomes Short Term: Continued assessment and intervention until BP is < 140/27mm HG in hypertensive participants. < 130/22mm HG in hypertensive participants with diabetes, heart failure or chronic kidney disease.;Long Term: Maintenance of blood pressure at goal  levels.    Lipids Yes    Intervention Provide education and support for participant on nutrition & aerobic/resistive exercise along with prescribed medications to achieve LDL 70mg , HDL >40mg .    Expected Outcomes Short Term: Participant states understanding of desired cholesterol values and is compliant with medications prescribed. Participant is following exercise prescription and nutrition guidelines.;Long Term: Cholesterol controlled with medications as prescribed, with individualized exercise RX and with personalized nutrition plan. Value goals: LDL < 70mg , HDL > 40 mg.          Core Components/Risk Factors/Patient Goals Review:    Core Components/Risk Factors/Patient Goals at Discharge (Final Review):    ITP Comments:  ITP Comments     Row Name 01/15/24 1316 01/22/24 1103 01/31/24 1034 01/31/24 1407     ITP Comments Completed virtual orientation today.  EP evaluation is scheduled for 9/29 at 10:30 am .  Documentation for diagnosis can be found in [facility] encounter [facility]. Patient attend orientation today.  Patient is attending Pulmonary Rehabilitation Program.  Documentation for diagnosis can be found in [place] [facility].  Reviewed medical chart, RPE/RPD, gym safety, and program guidelines.  Patient was fitted to equipment they will be using during rehab.  Patient is scheduled to start exercise on 01/31/24.   Initial ITP created and sent for review and signature by Dr. Anton Kelp, Medical Director for Pulmonary Rehabilitation Program. First full day of exercise!  Patient was oriented to gym and equipment including functions, settings, policies, and procedures.  Patient's individual exercise prescription and treatment plan were reviewed.  All starting workloads were established based on the results of the 6 minute walk test done at initial orientation visit.  The plan for exercise progression was also introduced and progression will be customized based on patient's performance and goals. 30  day review completed. ITP sent to Dr.Jehanzeb Memon, Medical Director of  Pulmonary Rehab. Continue with ITP unless changes are made by physician.    Started exercise sessions today.       Comments: 30 day review

## 2024-02-05 ENCOUNTER — Other Ambulatory Visit: Payer: Self-pay

## 2024-02-05 ENCOUNTER — Emergency Department (HOSPITAL_COMMUNITY)

## 2024-02-05 ENCOUNTER — Encounter (HOSPITAL_COMMUNITY): Payer: Self-pay

## 2024-02-05 ENCOUNTER — Encounter (HOSPITAL_COMMUNITY)

## 2024-02-05 ENCOUNTER — Emergency Department (HOSPITAL_COMMUNITY)
Admission: EM | Admit: 2024-02-05 | Discharge: 2024-02-05 | Disposition: A | Discharge status: Home / Self Care | Attending: Emergency Medicine | Admitting: Emergency Medicine

## 2024-02-05 DIAGNOSIS — Z950 Presence of cardiac pacemaker: Secondary | ICD-10-CM | POA: Insufficient documentation

## 2024-02-05 DIAGNOSIS — R059 Cough, unspecified: Secondary | ICD-10-CM | POA: Insufficient documentation

## 2024-02-05 DIAGNOSIS — J449 Chronic obstructive pulmonary disease, unspecified: Secondary | ICD-10-CM | POA: Diagnosis not present

## 2024-02-05 DIAGNOSIS — J441 Chronic obstructive pulmonary disease with (acute) exacerbation: Secondary | ICD-10-CM

## 2024-02-05 DIAGNOSIS — I10 Essential (primary) hypertension: Secondary | ICD-10-CM | POA: Diagnosis not present

## 2024-02-05 DIAGNOSIS — Z79899 Other long term (current) drug therapy: Secondary | ICD-10-CM | POA: Insufficient documentation

## 2024-02-05 DIAGNOSIS — Z7951 Long term (current) use of inhaled steroids: Secondary | ICD-10-CM | POA: Diagnosis not present

## 2024-02-05 DIAGNOSIS — E871 Hypo-osmolality and hyponatremia: Secondary | ICD-10-CM | POA: Diagnosis not present

## 2024-02-05 DIAGNOSIS — Z7901 Long term (current) use of anticoagulants: Secondary | ICD-10-CM | POA: Diagnosis not present

## 2024-02-05 LAB — CBC WITH DIFFERENTIAL/PLATELET
Abs Immature Granulocytes: 0.02 K/uL (ref 0.00–0.07)
Basophils Absolute: 0 K/uL (ref 0.0–0.1)
Basophils Relative: 0 %
Eosinophils Absolute: 0.1 K/uL (ref 0.0–0.5)
Eosinophils Relative: 2 %
HCT: 41.8 % (ref 39.0–52.0)
Hemoglobin: 13.8 g/dL (ref 13.0–17.0)
Immature Granulocytes: 0 %
Lymphocytes Relative: 13 %
Lymphs Abs: 0.9 K/uL (ref 0.7–4.0)
MCH: 31.4 pg (ref 26.0–34.0)
MCHC: 33 g/dL (ref 30.0–36.0)
MCV: 95.2 fL (ref 80.0–100.0)
Monocytes Absolute: 0.7 K/uL (ref 0.1–1.0)
Monocytes Relative: 11 %
Neutro Abs: 4.8 K/uL (ref 1.7–7.7)
Neutrophils Relative %: 74 %
Platelets: 199 K/uL (ref 150–400)
RBC: 4.39 MIL/uL (ref 4.22–5.81)
RDW: 14.5 % (ref 11.5–15.5)
WBC: 6.5 K/uL (ref 4.0–10.5)
nRBC: 0 % (ref 0.0–0.2)

## 2024-02-05 LAB — COMPREHENSIVE METABOLIC PANEL WITH GFR
ALT: 16 U/L (ref 0–44)
AST: 35 U/L (ref 15–41)
Albumin: 3.7 g/dL (ref 3.5–5.0)
Alkaline Phosphatase: 92 U/L (ref 38–126)
Anion gap: 10 (ref 5–15)
BUN: 11 mg/dL (ref 8–23)
CO2: 26 mmol/L (ref 22–32)
Calcium: 9.5 mg/dL (ref 8.9–10.3)
Chloride: 98 mmol/L (ref 98–111)
Creatinine, Ser: 0.88 mg/dL (ref 0.61–1.24)
GFR, Estimated: >60 mL/min (ref >60)
Glucose, Bld: 116 mg/dL — ABNORMAL HIGH (ref 70–99)
Potassium: 3.8 mmol/L (ref 3.5–5.1)
Sodium: 134 mmol/L — ABNORMAL LOW (ref 135–145)
Total Bilirubin: 0.5 mg/dL (ref 0.0–1.2)
Total Protein: 7 g/dL (ref 6.5–8.1)

## 2024-02-05 LAB — RESP PANEL BY RT-PCR (RSV, FLU A&B, COVID)  RVPGX2
Influenza A by PCR: NEGATIVE
Influenza B by PCR: NEGATIVE
Resp Syncytial Virus by PCR: NEGATIVE
SARS Coronavirus 2 by RT PCR: NEGATIVE

## 2024-02-05 LAB — PRO BRAIN NATRIURETIC PEPTIDE: Pro Brain Natriuretic Peptide: 4659 pg/mL — ABNORMAL HIGH (ref <300.0)

## 2024-02-05 LAB — TROPONIN T, HIGH SENSITIVITY
Troponin T High Sensitivity: 43 ng/L — ABNORMAL HIGH (ref 0–19)
Troponin T High Sensitivity: 39 ng/L — ABNORMAL HIGH (ref 0–19)

## 2024-02-05 MED ORDER — METHYLPREDNISOLONE SODIUM SUCC 125 MG IJ SOLR
80.0000 mg | Freq: Once | INTRAMUSCULAR | Status: DC
  Filled 2024-02-05: qty 2

## 2024-02-05 MED ORDER — IPRATROPIUM-ALBUTEROL 0.5-2.5 (3) MG/3ML IN SOLN
3.0000 mL | Freq: Once | RESPIRATORY_TRACT | Status: AC
  Administered 2024-02-05: 3 mL via RESPIRATORY_TRACT

## 2024-02-05 MED ORDER — METHYLPREDNISOLONE SODIUM SUCC 125 MG IJ SOLR
80.0000 mg | Freq: Once | INTRAMUSCULAR | Status: AC
Start: 2024-02-05 — End: 2024-02-05
  Administered 2024-02-05: 80 mg via INTRAMUSCULAR

## 2024-02-05 MED ORDER — DOXYCYCLINE HYCLATE 100 MG PO CAPS: 100.0000 mg | ORAL_CAPSULE | Freq: Two times a day (BID) | ORAL | 0 refills | Status: AC

## 2024-02-05 MED ORDER — PREDNISONE 10 MG PO TABS: 40.0000 mg | ORAL_TABLET | Freq: Every day | ORAL | 0 refills | Status: AC

## 2024-02-05 NOTE — ED Provider Notes (Signed)
 [place] EMERGENCY DEPARTMENT AT [facility] Provider Note   CSN: [id number] Arrival date & time: 02/05/24  1311     Patient presents with: Shortness of Breath   Calvin Carroll. is a 68 y.o. male with a history of COPD, A-fib status post ablation and pacemaker placement, hypertension, MI, renal transplant on tacrolimus  presents with concern for a cough and mild shortness of breath that began after getting his flu shot 4 days ago.  He denies any other symptoms such as fever, chills, sore throat, nausea or vomiting.  Denies any chest pain.  Denies any swelling or pain in his lower extremities.  He reports the cough is getting better.  He wanted to be evaluated due to his history of being a renal transplant recipient as he had had frequent pneumonias in the past.    Shortness of Breath      Prior to Admission medications   Medication Sig Start Date End Date Taking? Authorizing Provider  apixaban  (ELIQUIS ) 5 MG TABS tablet Take 1 tablet (5 mg total) by mouth 2 (two) times daily. 09/03/22   Ricky Fines, MD  azithromycin (ZITHROMAX) 250 MG tablet Take 250 mg by mouth daily.    [provider]  Cholecalciferol 50 MCG (2000 UT) TABS Take 1 tablet by mouth daily.    [provider]  diltiazem  (CARDIZEM  CD) 360 MG 24 hr capsule Take 1 capsule (360 mg total) by mouth daily. Patient not taking: Reported on 01/22/2024 11/03/22   Adhikari, Amrit, MD  fexofenadine (ALLEGRA) 180 MG tablet Take 180 mg by mouth daily as needed for allergies. 08/05/04   [provider]  flunisolide (NASALIDE) 25 MCG/ACT (0.025%) SOLN Place 2 sprays into the nose 2 (two) times daily. Patient not taking: Reported on 01/22/2024    [provider]  guaiFENesin  (MUCINEX  PO) Take 2 tablets by mouth every 12 (twelve) hours.    [provider]  hydrALAZINE  (APRESOLINE ) 25 MG tablet Take 25 mg by mouth daily. 10/08/22   [provider]  labetalol  (NORMODYNE )  100 MG tablet Take 100 mg by mouth 2 (two) times daily.    [provider]  levalbuterol  (XOPENEX  HFA) 45 MCG/ACT inhaler Inhale 2 puffs into the lungs every 6 (six) hours as needed for wheezing. 09/03/22 01/22/24  Ricky Fines, MD  lisinopril  (ZESTRIL ) 10 MG tablet Take 10 mg by mouth daily.    [provider]  metoprolol  tartrate (LOPRESSOR ) 50 MG tablet Take 1 tablet (50 mg total) by mouth 2 (two) times daily. Patient not taking: Reported on 01/22/2024 08/19/22   Vicci Afton LITTIE, MD  Mometasone  Furoate 200 MCG/ACT AERO Inhale 2 each into the lungs in the morning and at bedtime. Patient not taking: Reported on 01/22/2024 03/08/22   [provider]  montelukast (SINGULAIR) 10 MG tablet Take 10 mg by mouth at bedtime.    [provider]  Mycophenolate  Sodium (MYCOPHENOLIC ACID ) 180 MG TBEC Take 180 mg by mouth 2 (two) times daily.    [provider]  nicotine  (NICODERM CQ  - DOSED IN MG/24 HOURS) 14 mg/24hr patch Place 1 patch (14 mg total) onto the skin daily. Patient not taking: Reported on 01/22/2024 11/03/22   Jillian Buttery, MD  Omega-3 Fatty Acids (FISH OIL) 1000 MG CAPS Take 2 capsules by mouth daily.    [provider]  Omega-3 Fatty Acids (FISH OIL) 1000 MG CAPS Take 100 mg by mouth daily.    [provider]  omeprazole (PRILOSEC)  20 MG capsule Take 20 mg by mouth daily.    [provider]  pantoprazole  (PROTONIX ) 40 MG tablet Take 40 mg by mouth daily before supper.    [provider]  potassium chloride SA (KLOR-CON M) 20 MEQ tablet Take 20 mEq by mouth once. Patient not taking: Reported on 01/22/2024    [provider]  predniSONE  (DELTASONE ) 5 MG tablet Take 1 tablet (5 mg total) by mouth daily with breakfast. Resume this after finishing 10 mg prednisone  11/02/22   Jillian Buttery, MD  rosuvastatin (CRESTOR) 5 MG tablet Take 5 mg by mouth daily.    [provider]  tacrolimus  (PROGRAF ) 0.5  MG capsule Take 0.5 mg by mouth See admin instructions. Take 0.5 mg daily in the morning with the 1 mg capsule to equal 1.5 mg in the morning    [provider]  tacrolimus  (PROGRAF ) 1 MG capsule Take 1 mg by mouth See admin instructions. Take 1 mg in the morning (take with 0.5 mg to equal 1.5 mg in the morning) and 2 mg in the evening    [provider]  tiotropium (SPIRIVA) 18 MCG inhalation capsule Place 18 mcg into inhaler and inhale daily.    [provider]  torsemide  (DEMADEX ) 10 MG tablet Take 1 tablet (10 mg total) by mouth daily. 11/03/22   Jillian Buttery, MD    Allergies: Atorvastatin     Review of Systems  Respiratory:  Positive for shortness of breath.     Updated Vital Signs BP (!) 137/94   Pulse 78   Temp 97.8 F (36.6 C)   Resp 20   Ht 5' 8 (1.727 m)   Wt 52.3 kg   SpO2 97%   BMI 17.53 kg/m   Physical Exam Vitals and nursing note reviewed.  Constitutional:      General: He is not in acute distress.    Appearance: He is well-developed.  HENT:     Head: Normocephalic and atraumatic.  Eyes:     Conjunctiva/sclera: Conjunctivae normal.  Cardiovascular:     Rate and Rhythm: Normal rate and regular rhythm.     Heart sounds: No murmur heard. Pulmonary:     Effort: Pulmonary effort is normal. No respiratory distress.     Breath sounds: Normal breath sounds.     Comments: Lungs clear to auscultation bilaterally. Talking in full sentences on room air without difficulty Abdominal:     Palpations: Abdomen is soft.     Tenderness: There is no abdominal tenderness.  Musculoskeletal:        General: No swelling.     Cervical back: Neck supple.     Comments: No lower extremity edema bilaterally.  No calf tenderness to palpation bilaterally  Skin:    General: Skin is warm and dry.     Capillary Refill: Capillary refill takes less than 2 seconds.  Neurological:     Mental Status: He is alert.  Psychiatric:        Mood and Affect: Mood  normal.     (all labs ordered are listed, but only abnormal results are displayed) Labs Reviewed  COMPREHENSIVE METABOLIC PANEL WITH GFR - Abnormal; Notable for the following components:      Result Value   Sodium 134 (*)    Glucose, Bld 116 (*)    All other components within normal limits  PRO BRAIN NATRIURETIC PEPTIDE - Abnormal; Notable for the following components:   Pro Brain Natriuretic Peptide 4,659.0 (*)  All other components within normal limits  TROPONIN T, HIGH SENSITIVITY - Abnormal; Notable for the following components:   Troponin T High Sensitivity 43 (*)    All other components within normal limits  RESP PANEL BY RT-PCR (RSV, FLU A&B, COVID)  RVPGX2  CBC WITH DIFFERENTIAL/PLATELET  TROPONIN T, HIGH SENSITIVITY    EKG: EKG Interpretation Date/Time:  Monday February 05 2024 14:54:33 EDT Ventricular Rate:  84 PR Interval:    QRS Duration:  140 QT Interval:  419 QTC Calculation: 496 R Axis:   114  Text Interpretation: VENTRICULAR PACED RHYTHM No significant change since last tracing Confirmed by Towana Sharper [phone]) on 02/05/2024 3:01:44 PM  Radiology: [facility] Chest 2 View Result Date: 02/05/2024 CLINICAL DATA:  History of COPD presenting with worsening shortness of breath. EXAM: CHEST - 2 VIEW COMPARISON:  October 30, 2022 FINDINGS: The heart size and mediastinal contours are within normal limits. A radiopaque loop recorder device is in place. Marked severity calcification of the aortic arch is present. There is evidence of emphysematous lung disease with diffuse, chronic appearing increased interstitial lung markings. Mild left basilar linear scarring and/or atelectasis is seen. No focal consolidation, pleural effusion or pneumothorax is identified. The visualized skeletal structures are unremarkable. IMPRESSION: COPD with mild left basilar linear scarring and/or atelectasis. Electronically Signed   By: Suzen Dials M.D.   On: 02/05/2024 15:03     Procedures    Medications Ordered in the ED - No data to display                                  Medical Decision Making Amount and/or Complexity of Data Reviewed Labs: ordered. Radiology: ordered.     Differential diagnosis includes but is not limited to viral URI, ACS, arrhythmia, aortic aneurysm, pericarditis, myocarditis, pericardial effusion, cardiac tamponade, musculoskeletal pain, GERD, Boerhaave's syndrome, DVT/PE, pneumonia, pleural effusion   ED Course:  Upon initial evaluation, patient is very well-appearing, stable vitals aside from mildly elevated blood pressure of 137/94.  Maintaining 97% oxygen saturation on room air.  Talking in full sentences without difficulty.  Lungs clear to auscultation bilaterally.  Denies any chest pain or shortness of breath currently.   Labs Ordered: I Ordered, and personally interpreted labs.  The pertinent results include:   CBC within normal limits. CMP with mild hyponatremia 134.  Otherwise unremarkable Initial troponin elevated at 43, awaiting repeat proBNP elevated at 4659 COVID, flu, RSV negative  Imaging Studies ordered: I ordered imaging studies including chest x-ray I independently visualized the imaging with scope of interpretation limited to determining acute life threatening conditions related to emergency care. Imaging showed  IMPRESSION:  COPD with mild left basilar linear scarring and/or atelectasis.   I agree with the radiologist interpretation   Cardiac Monitoring: / EKG: The patient was maintained on a cardiac monitor.  I personally viewed and interpreted the cardiac monitored which showed an underlying rhythm of: Paced rhythm  Medications Given: None  Upon re-evaluation, patient remains well-appearing with stable vitals.  Low concern for pneumonia at this time given chest x-ray without any consolidations, no fevers, cough is improving, lungs clear to auscultation bilaterally.  His COVID, flu, RSV testing is negative.   proBNP was elevated here, but no signs of CHF exacerbation exam.  He does not have any lower extremity edema.  No pulmonary edema on chest x-ray.  His pro-BNP has been elevated previously, although hard to directly  compare values as previous lab was just BNP. However, I do not think this is a new change today.  His troponin was elevated at 43, but low clinical concern for ACS as he is not having any active chest pain, shortness of breath started a couple days ago without worsening.  Will obtain repeat for further evaluation. No lower extremity edema or pain, no hypoxia or tachycardia, low concern for DVT or PE.    I have suspicion that his symptoms today are likely due to the recent flu shot he obtained just prior to symptoms starting.  Is reassuring that his symptoms are improving.  He remains with stable vitals today throughout the ER stay.   Impression: Cough  Disposition:  Care of this patient signed out to oncoming ED provider Calvin Conger, PA-C to follow-up on repeat troponin.  Anticipate the patient can likely be discharged home if repeat troponin remains stable and patient able to ambulate without drop in oxygen saturation.  Disposition and treatment plan pending imaging results and clinical judgment of oncoming ED team.     This chart was dictated using voice recognition software, Dragon. Despite the best efforts of this provider to proofread and correct errors, errors may still occur which can change documentation meaning.       Final diagnoses:  Cough, unspecified type    ED Discharge Orders     None          Veta Palma, PA-C 02/05/24 1711    Towana Ozell BROCKS, MD 02/05/24 [phone]

## 2024-02-05 NOTE — ED Provider Notes (Signed)
 Patient was signed out to myself at shift change pending repeat troponin.  Patient presented to the emergency department secondary to shortness of breath, productive cough which has been ongoing over the weekend after receiving his flu shot.  Patient does note that symptoms have improved but he still wanted to be evaluated in the emergency department to ensure that there was no other abnormalities.  He has status post kidney transplant and is compliant with all of his medications. Physical Exam  BP (!) 137/94   Pulse 78   Temp 97.8 F (36.6 C)   Resp 20   Ht 5' 8 (1.727 m)   Wt 52.3 kg   SpO2 97%   BMI 17.53 kg/m   Physical Exam  Procedures  Procedures  ED Course / MDM    Medical Decision Making Amount and/or Complexity of Data Reviewed Labs: ordered. Radiology: ordered.  Risk Prescription drug management.   Patient is doing very well at this time and is stable for discharge home.  EKG had no acute ischemic changes and both troponins have been unremarkable.  He has no indication for fluid overload at this point and do not suspect acute CHF.  Will treat for COPD exacerbation with on outpatient basis.  He was directed to continue doing his breathing treatments at home as he has not been doing this.  Close follow-up with PCP was discussed as well as strict turn precautions for any new or worsening symptoms.  He was able to ambulate without any desaturations in the emergency department.  He is otherwise feeling well at this point.  Do not suspect that admission is warranted at this time.  Patient voiced understanding to the plan and had no additional questions.       Daralene Lonni BIRCH, PA-C 02/05/24 DANIAL Suzette Pac, MD 02/06/24 1049

## 2024-02-05 NOTE — ED Notes (Signed)
 Pt ambulated around nurses station with this NT. Pt O2 sat was 96% on room air. Pt O2 sat stayed 96% on room air. Pt pulse was 70-80 bpm. Pt had co of SOB no dizziness. Pt is back in bed at this time. Pt is hooked up to cardiac monitor, pulse ox and blood pressure cuff. Pt O2 sat is currently 98% on room air. Heart rate is 76. RR is 22. Call bed in reach RN notified of results from walking with pulse ox

## 2024-02-05 NOTE — Discharge Instructions (Addendum)
 Your symptoms today are likely due to your body reacting to the flu vaccine.   Your chest x-ray did not show any signs of pneumonia.  Your COVID, flu, RSV testing is negative.  Your blood counts, electrolytes, kidney function were normal today.  Please return immediately to the emergency room if you develop worsening shortness of breath, chest pain, fevers, any other new or concerning symptoms

## 2024-02-05 NOTE — ED Triage Notes (Signed)
 Pt arrived via [facility] from [facility] for evaluation of feeling SOB. Pt reports recent ablation for his A-Fib, and has a pacemaker. Pt reports his girlfriend Almetta aster advised him to come to the ER. Pt reports he tried to go to the [facility] bu they were closed today. Pt endorses dyspnea at rest.

## 2024-02-07 ENCOUNTER — Encounter (HOSPITAL_COMMUNITY)

## 2024-02-12 ENCOUNTER — Encounter (HOSPITAL_COMMUNITY)
Admission: RE | Admit: 2024-02-12 | Discharge: 2024-02-12 | Disposition: A | Source: Ambulatory Visit | Attending: Critical Care Medicine | Admitting: Critical Care Medicine

## 2024-02-12 DIAGNOSIS — J449 Chronic obstructive pulmonary disease, unspecified: Secondary | ICD-10-CM

## 2024-02-12 NOTE — Progress Notes (Signed)
 Daily Session Note  Patient Details  Name: Hilary Pundt. MRN: [id number] Date of Birth: Oct 05, 1955 Referring Provider:   Flowsheet Row PULMONARY REHAB COPD ORIENTATION from 01/22/2024 in [facility] CARD[facility]AC [facility]ITATION  Referring Provider Marcos Capes DO    Encounter Date: 02/12/2024  Check In:  Session Check In - 02/12/24 1129       Check-In   Supervising physician immediately available to respond to emergencies See telemetry face sheet for immediately available MD    Location [facility]    Staff Present Powell Benders, BS, Exercise Physiologist;Javi Bollman Vonzell, MA, RCEP, CCRP, CCET;Brittany Jackquline, BSN, RN, WTA-C    Virtual Visit No    Medication changes reported     No    Fall or balance concerns reported    No    Warm-up and Cool-down Performed on first and last piece of equipment    Resistance Training Performed Yes    VAD Patient? No    PAD/SET Patient? No      Pain Assessment   Currently in Pain? No/denies          Capillary Blood Glucose: No results found for this or any previous visit (from the past 24 hours).    Social History   Tobacco Use  Smoking Status Every Day   Current packs/day: 1.50   Types: Cigarettes   Passive exposure: Current  Smokeless Tobacco Never    Goals Met:  Proper associated with RPD/PD & O2 Sat Independence with exercise equipment Using PLB without cueing & demonstrates good technique Exercise tolerated well No report of concerns or symptoms today Strength training completed today  Goals Unmet:  Not Applicable  Comments: Pt able to follow exercise prescription today without complaint.  Will continue to monitor for progression.

## 2024-02-14 ENCOUNTER — Encounter (HOSPITAL_COMMUNITY)
Admission: RE | Admit: 2024-02-14 | Discharge: 2024-02-14 | Disposition: A | Discharge status: Home / Self Care | Source: Ambulatory Visit | Attending: Critical Care Medicine | Admitting: Critical Care Medicine

## 2024-02-14 DIAGNOSIS — J449 Chronic obstructive pulmonary disease, unspecified: Secondary | ICD-10-CM

## 2024-02-14 NOTE — Progress Notes (Signed)
 Daily Session Note  Patient Details  Name: Calvin Carroll. MRN: [id number] Date of Birth: October 17, 1955 Referring Provider:   Flowsheet Row PULMONARY REHAB COPD ORIENTATION from 01/22/2024 in [facility] CARDIAC REHABILITATION  Referring Provider Marcos Capes DO    Encounter Date: 02/14/2024  Check In:  Session Check In - 02/14/24 1100       Check-In   Supervising physician immediately available to respond to emergencies See telemetry face sheet for immediately available MD    Location [facility]    Staff Present Adrien Louder, RN, BSN;Tina Annett, RN;Brittany Donahue, BSN, RN, Rosalba Gelineau, MA, RCEP, CCRP, CCET    Virtual Visit No    Medication changes reported     No    Fall or balance concerns reported    No    Tobacco Cessation No Change    Current number of cigarettes/nicotine  per day     4    Warm-up and Cool-down Performed on first and last piece of equipment    Resistance Training Performed Yes    VAD Patient? No    PAD/SET Patient? No      Pain Assessment   Currently in Pain? No/denies    Pain Score 0-No pain    Multiple Pain Sites No          Capillary Blood Glucose: No results found for this or any previous visit (from the past 24 hours).    Social History   Tobacco Use  Smoking Status Every Day   Current packs/day: 1.50   Types: Cigarettes   Passive exposure: Current  Smokeless Tobacco Never    Goals Met:  Proper associated with RPD/PD & O2 Sat Independence with exercise equipment Using PLB without cueing & demonstrates good technique Exercise tolerated well No report of concerns or symptoms today Strength training completed today  Goals Unmet:  Not Applicable  Comments: Pt able to follow exercise prescription today without complaint.  Will continue to monitor for progression.

## 2024-02-19 ENCOUNTER — Encounter (HOSPITAL_COMMUNITY)
Admission: RE | Admit: 2024-02-19 | Discharge: 2024-02-19 | Disposition: A | Source: Ambulatory Visit | Attending: Critical Care Medicine | Admitting: Critical Care Medicine

## 2024-02-19 DIAGNOSIS — J449 Chronic obstructive pulmonary disease, unspecified: Secondary | ICD-10-CM

## 2024-02-19 NOTE — Progress Notes (Signed)
 Daily Session Note  Patient Details  Name: Calvin Carroll. MRN: [id number] Date of Birth: September 25, 1955 Referring Provider:   Flowsheet Row PULMONARY REHAB COPD ORIENTATION from 01/22/2024 in [facility] CARDIAC REHABILITATION  Referring Provider Marcos Capes DO    Encounter Date: 02/19/2024  Check In:  Session Check In - 02/19/24 1058       Check-In   Supervising physician immediately available to respond to emergencies See telemetry face sheet for immediately available MD    Location [facility]    Staff Present Laymon Rattler, BSN, RN, WTA-C;Victoria Zina, RN;Heather Con, BS, Exercise Physiologist    Virtual Visit No    Medication changes reported     No    Fall or balance concerns reported    No    Tobacco Cessation No Change    Current number of cigarettes/nicotine  per day     0    Warm-up and Cool-down Performed on first and last piece of equipment    Resistance Training Performed Yes    VAD Patient? No    PAD/SET Patient? No      Pain Assessment   Currently in Pain? No/denies          Capillary Blood Glucose: No results found for this or any previous visit (from the past 24 hours).    Social History   Tobacco Use  Smoking Status Every Day   Current packs/day: 1.50   Types: Cigarettes   Passive exposure: Current  Smokeless Tobacco Never    Goals Met:  Proper associated with RPD/PD & O2 Sat Independence with exercise equipment Improved SOB with ADL's Using PLB without cueing & demonstrates good technique Exercise tolerated well Personal goals reviewed No report of concerns or symptoms today Strength training completed today  Goals Unmet:  Not Applicable  Comments: Pt able to follow exercise prescription today without complaint.  Will continue to monitor for progression.

## 2024-02-21 ENCOUNTER — Encounter (HOSPITAL_COMMUNITY)
Admission: RE | Admit: 2024-02-21 | Discharge: 2024-02-21 | Disposition: A | Discharge status: Home / Self Care | Source: Ambulatory Visit | Attending: Critical Care Medicine | Admitting: Critical Care Medicine

## 2024-02-21 DIAGNOSIS — J449 Chronic obstructive pulmonary disease, unspecified: Secondary | ICD-10-CM | POA: Diagnosis not present

## 2024-02-21 NOTE — Progress Notes (Signed)
 Daily Session Note  Patient Details  Name: Calvin Carroll. MRN: [id number] Date of Birth: Aug 06, 1955 Referring Provider:   Flowsheet Row PULMONARY REHAB COPD ORIENTATION from 01/22/2024 in [facility] CARDIAC REHABILITATION  Referring Provider Marcos Capes DO    Encounter Date: 02/21/2024  Check In:  Session Check In - 02/21/24 1045       Check-In   Supervising physician immediately available to respond to emergencies See telemetry face sheet for immediately available MD    Location [facility]    Staff Present Laymon Rattler, BSN, RN, WTA-C;Heather Con, BS, Exercise Physiologist;Jessica Vonzell, MA, RCEP, CCRP, CCET    Virtual Visit No    Medication changes reported     No    Fall or balance concerns reported    No    Tobacco Cessation No Change    Warm-up and Cool-down Performed on first and last piece of equipment    Resistance Training Performed Yes    VAD Patient? No    PAD/SET Patient? No      Pain Assessment   Currently in Pain? No/denies          Capillary Blood Glucose: No results found for this or any previous visit (from the past 24 hours).    Social History   Tobacco Use  Smoking Status Every Day   Current packs/day: 1.50   Types: Cigarettes   Passive exposure: Current  Smokeless Tobacco Never    Goals Met:  Proper associated with RPD/PD & O2 Sat Independence with exercise equipment Improved SOB with ADL's Using PLB without cueing & demonstrates good technique Exercise tolerated well No report of concerns or symptoms today Strength training completed today  Goals Unmet:  Not Applicable  Comments: Pt able to follow exercise prescription today without complaint.  Will continue to monitor for progression.

## 2024-02-26 ENCOUNTER — Encounter (HOSPITAL_COMMUNITY)

## 2024-02-28 ENCOUNTER — Encounter (HOSPITAL_COMMUNITY)

## 2024-02-28 ENCOUNTER — Telehealth (HOSPITAL_COMMUNITY): Payer: Self-pay | Admitting: Surgery

## 2024-02-28 ENCOUNTER — Encounter (HOSPITAL_COMMUNITY): Payer: Self-pay

## 2024-02-28 DIAGNOSIS — J449 Chronic obstructive pulmonary disease, unspecified: Secondary | ICD-10-CM

## 2024-02-28 NOTE — Telephone Encounter (Signed)
 I called the pt since he missed his pulmonary rehab session today.  The pt stated that he was having a bad breathing day and was not able to come in and exercise.  He checked his 02 sats at home and his oxygen level was 95.  The pt stated that he has an appointment this Friday, so he plans to be back to rehab on Monday.  I told the pt to call our office if he will not be able to attend rehab on Monday, and he verbalized understanding.

## 2024-02-28 NOTE — Progress Notes (Signed)
 Pulmonary Individual Treatment Plan  Patient Details  Name: Calvin Carroll. MRN: [id number] Date of Birth: 12/04/1955 Referring Provider:   Flowsheet Row PULMONARY REHAB COPD ORIENTATION from 01/22/2024 in [facility] CARDIAC REHABILITATION  Referring Provider Marcos Capes DO    Initial Encounter Date:  Flowsheet Row PULMONARY REHAB COPD ORIENTATION from 01/22/2024 in [facility] CARDIAC REHABILITATION  Date 01/22/24    Visit Diagnosis: Chronic obstructive pulmonary disease, unspecified COPD type (HCC)  Patient's Home Medications on Admission:   Current Outpatient Medications:    apixaban  (ELIQUIS ) 5 MG TABS tablet, Take 1 tablet (5 mg total) by mouth 2 (two) times daily., Disp: 60 tablet, Rfl: 2   azithromycin (ZITHROMAX) 250 MG tablet, Take 250 mg by mouth daily., Disp: , Rfl:    Cholecalciferol 50 MCG (2000 UT) TABS, Take 1 tablet by mouth daily., Disp: , Rfl:    doxycycline  (VIBRAMYCIN ) 100 MG capsule, Take 1 capsule (100 mg total) by mouth 2 (two) times daily., Disp: 20 capsule, Rfl: 0   fexofenadine (ALLEGRA) 180 MG tablet, Take 180 mg by mouth daily., Disp: , Rfl:    flunisolide (NASALIDE) 25 MCG/ACT (0.025%) SOLN, Place 2 sprays into the nose daily., Disp: , Rfl:    lisinopril  (ZESTRIL ) 10 MG tablet, Take 10 mg by mouth in the morning and at bedtime., Disp: , Rfl:    Mycophenolate  Sodium (MYCOPHENOLIC ACID ) 180 MG TBEC, Take 180 mg by mouth 2 (two) times daily., Disp: , Rfl:    nicotine  polacrilex (NICORETTE) 4 MG gum, Take 4 mg by mouth as needed for smoking cessation., Disp: , Rfl:    Omega-3 Fatty Acids (FISH OIL) 1000 MG CAPS, Take 1 capsule by mouth daily., Disp: , Rfl:    PRESCRIPTION MEDICATION, Take 1 tablet by mouth daily. Heartburn/acid reflux medication from [facility], Disp: , Rfl:    tacrolimus  (PROGRAF ) 1 MG capsule, Take 1 mg by mouth See admin instructions. Take 1 mg in the morning (take with 0.5 mg to equal 1.5 mg in the morning) and 1 mg in the evening,  Disp: , Rfl:    Tiotropium Bromide-Olodaterol (STIOLTO RESPIMAT) 2.5-2.5 MCG/ACT AERS, Inhale 2 puffs into the lungs daily., Disp: , Rfl:    torsemide  (DEMADEX ) 10 MG tablet, Take 1 tablet (10 mg total) by mouth daily., Disp: 30 tablet, Rfl: 1   varenicline (CHANTIX) 0.5 MG tablet, Take 0.5 mg by mouth 2 (two) times daily., Disp: , Rfl:   Past Medical History: Past Medical History:  Diagnosis Date   Acute respiratory failure with hypoxia (HCC) 08/12/2022   Atrial fibrillation (HCC)    COPD (chronic obstructive pulmonary disease) (HCC)    COPD exacerbation (HCC) 05/17/2021   Hypertension    MI (myocardial infarction) (HCC)    Renal disorder     Tobacco Use: Social History   Tobacco Use  Smoking Status Every Day   Current packs/day: 1.50   Types: Cigarettes   Passive exposure: Current  Smokeless Tobacco Never    Labs: Review Flowsheet       Latest Ref Rng & Units 08/12/2022 10/28/2022  Labs for ITP Cardiac and Pulmonary Rehab  Hemoglobin A1c 4.8 - 5.6 % 5.4  -  Bicarbonate 20.0 - 28.0 mmol/L - 24.6   O2 Saturation % - 82     Capillary Blood Glucose: Lab Results  Component Value Date   GLUCAP 146 (H) 08/19/2022     Pulmonary Assessment Scores:  Pulmonary Assessment Scores     Row Name 01/22/24 1106  ADL UCSD   ADL Phase Entry     SOB Score total 88     Rest 2     Walk 3     Stairs 4     Bath 3     Dress 3     Shop 4       CAT Score   CAT Score 29       UCSD: Self-administered rating of dyspnea associated with activities of daily living (ADLs) 6-point scale (0 = not at all to 5 = maximal or unable to do because of breathlessness)  Scoring Scores range from 0 to 120.  Minimally important difference is 5 units  CAT: CAT can identify the health impairment of COPD patients and is better correlated with disease progression.  CAT has a scoring range of zero to 40. The CAT score is classified into four groups of low (less than 10), medium (10 -  20), high (21-30) and very high (31-40) based on the impact level of disease on health status. A CAT score over 10 suggests significant symptoms.  A worsening CAT score could be explained by an exacerbation, poor medication adherence, poor inhaler technique, or progression of COPD or comorbid conditions.  CAT MCID is 2 points  mMRC: mMRC (Modified Medical Research Council) Dyspnea Scale is used to assess the degree of baseline functional disability in patients of respiratory disease due to dyspnea. No minimal important difference is established. A decrease in score of 1 point or greater is considered a positive change.   Pulmonary Function Assessment:   Exercise Target Goals: Exercise Program Goal: Individual exercise prescription set using results from initial 6 min walk test and THRR while considering  patient's activity barriers and safety.   Exercise Prescription Goal: Initial exercise prescription builds to 30-45 minutes a day of aerobic activity, 2-3 days per week.  Home exercise guidelines will be given to patient during program as part of exercise prescription that the participant will acknowledge.  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 HR  86 bpm     Max Ex. BP 136/70     2 Minute Post BP 122/70       Interval HR   1 Minute HR 84     2 Minute HR 83     3 Minute HR 86     4 Minute HR  85     5 Minute HR 83     6 Minute HR 86     2 Minute Post HR 83     Interval Heart Rate? Yes       Interval Oxygen   Interval Oxygen? Yes     Baseline Oxygen Saturation % 97 %     1 Minute Oxygen Saturation % 96 %     1 Minute Liters of Oxygen 0 L     2 Minute Oxygen Saturation % 97 %     2 Minute Liters of Oxygen 0 L     3 Minute Oxygen Saturation % 96 %     3 Minute Liters of Oxygen 0 L     4 Minute Oxygen Saturation % 95 %     4 Minute Liters of Oxygen 0 L     5 Minute Oxygen Saturation % 92 %     5 Minute Liters of Oxygen 0 L     6 Minute Oxygen Saturation % 95 %     6 Minute Liters of Oxygen 0 L     2 Minute Post Oxygen Saturation % 95 %     2 Minute Post Liters of Oxygen 0 L        Oxygen Initial Assessment:  Oxygen Initial Assessment - 01/15/24 1318       Home Oxygen   Home Oxygen Device None    Sleep Oxygen Prescription Continuous    Liters per minute 2    Home Exercise Oxygen Prescription None    Home Resting Oxygen Prescription None    Compliance with Home Oxygen Use Yes      Intervention   Short Term Goals To learn and exhibit compliance with exercise, home and travel O2 prescription;To learn and understand importance of monitoring SPO2 with pulse oximeter and demonstrate accurate use of the pulse oximeter.;To learn and understand importance of maintaining oxygen saturations>88%;To learn and demonstrate proper pursed lip breathing techniques or other breathing techniques. ;To learn and demonstrate proper use of respiratory medications    Long  Term Goals Exhibits compliance with exercise, home  and travel O2 prescription;Verbalizes importance of monitoring SPO2 with pulse oximeter and return demonstration;Maintenance of O2 saturations>88%;Exhibits proper breathing techniques, such as pursed lip breathing or other  method taught during program session;Compliance with respiratory medication;Demonstrates proper use of MDI's          Oxygen Re-Evaluation:  Oxygen Re-Evaluation     Row Name 01/31/24 1035 02/19/24 1107           Program Oxygen Prescription   Program Oxygen Prescription -- None        Home Oxygen   Home Oxygen Device -- None      Sleep Oxygen Prescription -- Continuous      Liters per minute -- 2      Home Exercise Oxygen Prescription -- None      Home Resting Oxygen Prescription -- None      Compliance with Home Oxygen Use -- Yes        Goals/Expected Outcomes   Short Term Goals -- To learn and exhibit compliance with exercise, home and travel O2 prescription;To learn and understand importance of monitoring SPO2 with pulse oximeter and demonstrate accurate use of the pulse oximeter.;To learn and understand importance of maintaining oxygen saturations>88%;To learn and demonstrate proper pursed lip breathing techniques or other breathing techniques. ;To learn and demonstrate proper use of respiratory medications      Long  Term Goals -- Exhibits compliance with exercise, home  and travel O2 prescription;Verbalizes importance of monitoring SPO2 with pulse oximeter and return demonstration;Maintenance of O2 saturations>88%;Exhibits proper breathing techniques, such as pursed lip breathing or other method taught during program session;Compliance with respiratory medication;Demonstrates proper use of MDI's      Comments Reviewed PLB technique with pt.  Talked about how it works and it's importance in maintaining their exercise saturations. Lewis feels his breathing has gotten a bit better since starting the program. He wears 2L at night, but hasn't used it in 2 weeks and he feels his breathing is still good. He does use PLB when he feels SOB.      Goals/Expected Outcomes Short: Become more profiecient at using PLB.   Long: Become independent at using PLB. Short: Continue to attend rehab.  Long: Continue to use PLB when feeling SOB.         Oxygen Discharge (Final Oxygen Re-Evaluation):  Oxygen Re-Evaluation - 02/19/24 1107       Program Oxygen Prescription   Program Oxygen Prescription None      Home Oxygen   Home Oxygen Device None    Sleep Oxygen Prescription Continuous    Liters per minute 2    Home Exercise Oxygen Prescription None    Home Resting Oxygen Prescription None    Compliance with Home Oxygen Use Yes      Goals/Expected Outcomes   Short Term Goals To learn and exhibit compliance with exercise, home and travel O2 prescription;To learn and understand importance of monitoring SPO2 with pulse oximeter and demonstrate accurate use of the pulse oximeter.;To learn and understand importance of maintaining oxygen saturations>88%;To learn and demonstrate proper pursed lip breathing techniques or other breathing techniques. ;To learn and demonstrate proper use of respiratory medications    Long  Term Goals Exhibits compliance with exercise, home  and travel O2 prescription;Verbalizes importance of monitoring SPO2 with pulse oximeter and return demonstration;Maintenance of O2 saturations>88%;Exhibits proper breathing techniques, such as pursed lip breathing or other method taught during program session;Compliance with respiratory medication;Demonstrates proper use of MDI's    Comments Lewis feels his breathing has gotten a bit better since starting the program. He wears 2L at night, but hasn't used it in 2 weeks and he feels his breathing is still good. He does use PLB when he feels SOB.    Goals/Expected Outcomes Short: Continue to attend rehab. Long: Continue to use PLB when feeling SOB.          Initial Exercise Prescription:  Initial Exercise Prescription - 01/22/24 1100       Date of Initial Exercise RX and Referring Provider   Date 01/22/24    Referring Provider Marcos, Chard DO      Bike   Level 4    Minutes 15    METs 1.8      NuStep   Level 1     SPM 50    Minutes 15    METs 1.8      Prescription Details   Frequency (times per week) 2    Duration Progress to 30 minutes of continuous aerobic without signs/symptoms of physical distress      Intensity   THRR 40-80% of Max Heartrate 110-139    Ratings of Perceived Exertion 11-13    Perceived Dyspnea 0-4      Resistance Training   Training Prescription Yes    Weight 3    Reps 10-15  Perform Capillary Blood Glucose checks as needed.  Exercise Prescription Changes:   Exercise Prescription Changes     Row Name 01/22/24 1100 01/31/24 1300 02/21/24 1200         Response to Exercise   Blood Pressure (Admit) 112/60 110/60 112/66     Blood Pressure (Exercise) 136/70 140/80 126/60     Blood Pressure (Exit) 122/70 128/68 102/60     Heart Rate (Admit) 81 bpm 69 bpm 61 bpm     Heart Rate (Exercise) 86 bpm 78 bpm 75 bpm     Heart Rate (Exit) 83 bpm 71 bpm 60 bpm     Oxygen Saturation (Admit) 97 % 97 % 95 %     Oxygen Saturation (Exercise) 92 % 98 % 98 %     Oxygen Saturation (Exit) 95 % 94 % 96 %     Rating of Perceived Exertion (Exercise) 12 15 13      Perceived Dyspnea (Exercise) 2 2 3      Duration -- Continue with 30 min of aerobic exercise without signs/symptoms of physical distress. Continue with 30 min of aerobic exercise without signs/symptoms of physical distress.     Intensity -- THRR unchanged THRR unchanged       Progression   Progression -- Continue to progress workloads to maintain intensity without signs/symptoms of physical distress. Continue to progress workloads to maintain intensity without signs/symptoms of physical distress.       Resistance Training   Training Prescription -- Yes Yes     Weight -- 3 3     Reps -- 10-15 10-15       Bike   Level -- 3 3     Minutes -- 15 15     METs -- 1.8 1.5       NuStep   Level -- 1 1     SPM -- 65 67     Minutes -- 15 15     METs -- 1.6 1.8        Exercise Comments:   Exercise Comments     Row  Name 01/15/24 1318 01/22/24 1104 01/31/24 1034       Exercise Comments Issiah currently goes to the [employer] a couple times a week and gets on the stationary bike. Patient attend orientation today.  Patient is attending Pulmonary Rehabilitation Program.  Documentation for diagnosis can be found in [facility].  Reviewed medical chart, RPE/RPD, gym safety, and program guidelines.  Patient was fitted to equipment they will be using during rehab.  Patient is scheduled to start exercise on 01/31/24.   Initial ITP created and sent for review and signature by Dr. Anton Kelp, Medical Director for Pulmonary Rehabilitation Program. First full day of exercise!  Patient was oriented to gym and equipment including functions, settings, policies, and procedures.  Patient's individual exercise prescription and treatment plan were reviewed.  All starting workloads were established based on the results of the 6 minute walk test done at initial orientation visit.  The plan for exercise progression was also introduced and progression will be customized based on patient's performance and goals.        Exercise Goals and Review:   Exercise Goals     Row Name 01/15/24 1317             Exercise Goals   Increase Physical Activity Yes       Intervention Provide advice, education, support and counseling about physical activity/exercise needs.;Develop an individualized exercise prescription for aerobic  and resistive training based on initial evaluation findings, risk stratification, comorbidities and participant's personal goals.       Expected Outcomes Short Term: Attend rehab on a regular basis to increase amount of physical activity.;Long Term: Exercising regularly at least 3-5 days a week.;Long Term: Add in home exercise to make exercise part of routine and to increase amount of physical activity.       Increase Strength and Stamina Yes       Intervention Provide advice, education, support and counseling about  physical activity/exercise needs.;Develop an individualized exercise prescription for aerobic and resistive training based on initial evaluation findings, risk stratification, comorbidities and participant's personal goals.       Expected Outcomes Short Term: Increase workloads from initial exercise prescription for resistance, speed, and METs.;Short Term: Perform resistance training exercises routinely during rehab and add in resistance training at home;Long Term: Improve cardiorespiratory fitness, muscular endurance and strength as measured by increased METs and functional capacity ( )       Able to understand and use rate of perceived exertion (RPE) scale Yes       Intervention Provide education and explanation on how to use RPE scale       Expected Outcomes Short Term: Able to use RPE daily in rehab to express subjective intensity level;Long Term:  Able to use RPE to guide intensity level when exercising independently       Able to understand and use Dyspnea scale Yes       Intervention Provide education and explanation on how to use Dyspnea scale       Expected Outcomes Short Term: Able to use Dyspnea scale daily in rehab to express subjective sense of shortness of breath during exertion;Long Term: Able to use Dyspnea scale to guide intensity level when exercising independently       Knowledge and understanding of Target Heart Rate Range (THRR) Yes       Intervention Provide education and explanation of THRR including how the numbers were predicted and where they are located for reference       Expected Outcomes Short Term: Able to state/look up THRR;Short Term: Able to use daily as guideline for intensity in rehab;Long Term: Able to use THRR to govern intensity when exercising independently       Able to check pulse independently Yes       Intervention Provide education and demonstration on how to check pulse in carotid and radial arteries.;Review the importance of being able to check your own  pulse for safety during independent exercise       Expected Outcomes Short Term: Able to explain why pulse checking is important during independent exercise;Long Term: Able to check pulse independently and accurately       Understanding of Exercise Prescription Yes       Intervention Provide education, explanation, and written materials on patient's individual exercise prescription       Expected Outcomes Short Term: Able to explain program exercise prescription;Long Term: Able to explain home exercise prescription to exercise independently          Exercise Goals Re-Evaluation :  Exercise Goals Re-Evaluation     Row Name 01/31/24 1035 01/31/24 1337 02/19/24 1106         Exercise Goal Re-Evaluation   Exercise Goals Review Increase Physical Activity;Increase Strength and Stamina;Able to understand and use Dyspnea scale;Able to understand and use rate of perceived exertion (RPE) scale;Knowledge and understanding of Target Heart Rate Range (THRR);Able to check pulse independently;Understanding  of Exercise Prescription Increase Physical Activity;Increase Strength and Stamina;Understanding of Exercise Prescription Increase Physical Activity;Increase Strength and Stamina;Able to understand and use Dyspnea scale;Able to understand and use rate of perceived exertion (RPE) scale;Knowledge and understanding of Target Heart Rate Range (THRR);Able to check pulse independently;Understanding of Exercise Prescription     Comments Reviewed RPE and dyspnea scale, THR and program prescription with pt today.  Pt voiced understanding and was given a copy of goals to take home. Lewis has completed his first session of PR. He is deconditioned and will need to take multiple breaks on the equipment. He did well today and pushed through. Will continue to monitor and progress as able. Lewis is doing well in rehab. He is increasing his levels on the bike and Nustep when he can. He states he walks some at home, and helps take  care of his 26 year old grandaughter who lives with them which helps keep him active.     Expected Outcomes Short: Use RPE daily to regulate intensity.  Long: Follow program prescription in THR. Short: get use to equipment   long: continue to attend rehab Short: Continue to attend rehab. Long: Continue to exercise at home.        Discharge Exercise Prescription (Final Exercise Prescription Changes):  Exercise Prescription Changes - 02/21/24 1200       Response to Exercise   Blood Pressure (Admit) 112/66    Blood Pressure (Exercise) 126/60    Blood Pressure (Exit) 102/60    Heart Rate (Admit) 61 bpm    Heart Rate (Exercise) 75 bpm    Heart Rate (Exit) 60 bpm    Oxygen Saturation (Admit) 95 %    Oxygen Saturation (Exercise) 98 %    Oxygen Saturation (Exit) 96 %    Rating of Perceived Exertion (Exercise) 13    Perceived Dyspnea (Exercise) 3    Duration Continue with 30 min of aerobic exercise without signs/symptoms of physical distress.    Intensity THRR unchanged      Progression   Progression Continue to progress workloads to maintain intensity without signs/symptoms of physical distress.      Resistance Training   Training Prescription Yes    Weight 3    Reps 10-15      Bike   Level 3    Minutes 15    METs 1.5      NuStep   Level 1    SPM 67    Minutes 15    METs 1.8          Nutrition:  Target Goals: Understanding of nutrition guidelines, daily intake of sodium 1500mg , cholesterol 200mg , calories 30% from fat and 7% or less from saturated fats, daily to have 5 or more servings of fruits and vegetables.  Biometrics:  Pre Biometrics - 01/22/24 1134       Pre Biometrics   Height 5' 8 (1.727 m)    Weight 115 lb 4.8 oz (52.3 kg)    Waist Circumference 29.5 inches    Hip Circumference 33 inches    Waist to Hip Ratio 0.89 %    BMI (Calculated) 17.54    Grip Strength 23.3 kg           Nutrition Therapy Plan and Nutrition Goals:  Nutrition Therapy &  Goals - 01/15/24 1320       Intervention Plan   Intervention Nutrition handout(s) given to patient.;Prescribe, educate and counsel regarding individualized specific dietary modifications aiming towards targeted core components such as  weight, hypertension, lipid management, diabetes, heart failure and other comorbidities.    Expected Outcomes Long Term Goal: Adherence to prescribed nutrition plan.;Short Term Goal: Understand basic principles of dietary content, such as calories, fat, sodium, cholesterol and nutrients.;Short Term Goal: A plan has been developed with personal nutrition goals set during dietitian appointment.          Nutrition Assessments:  Nutrition Assessments - 01/22/24 1107       Rate Your Plate Scores   Pre Score 33         MEDIFICTS Score Key: >=70 Need to make dietary changes  40-70 Heart Healthy Diet <= 40 Therapeutic Level Cholesterol Diet  Flowsheet Row PULMONARY REHAB COPD ORIENTATION from 01/22/2024 in [facility] CARDIAC REHABILITATION  Picture Your Plate Total Score on Admission 33   Picture Your Plate Scores: <59 Unhealthy dietary pattern with much room for improvement. 41-50 Dietary pattern unlikely to meet recommendations for good health and room for improvement. 51-60 More healthful dietary pattern, with some room for improvement.  >60 Healthy dietary pattern, although there may be some specific behaviors that could be improved.    Nutrition Goals Re-Evaluation:  Nutrition Goals Re-Evaluation     Row Name 02/19/24 1102             Goals   Current Weight 116 lb 8 oz (52.8 kg)       Nutrition Goal Healthy eating       Comment Ezzard is doing ok with his diet. He is actually trying to gain weight, so he basically eats what he wants, yet follows a renal diet as well due to having kidney issues. He has gained 2-3 lbs since starting the program. He loves to drink coffee and he does drink alcohol several times a week. Educated on moderation in  that area.       Expected Outcome Short: Continue to gain weight, but not too fast. Long: Enjoy his alcohol and sweets but in moderation.          Nutrition Goals Discharge (Final Nutrition Goals Re-Evaluation):  Nutrition Goals Re-Evaluation - 02/19/24 1102       Goals   Current Weight 116 lb 8 oz (52.8 kg)    Nutrition Goal Healthy eating    Comment Ezzard is doing ok with his diet. He is actually trying to gain weight, so he basically eats what he wants, yet follows a renal diet as well due to having kidney issues. He has gained 2-3 lbs since starting the program. He loves to drink coffee and he does drink alcohol several times a week. Educated on moderation in that area.    Expected Outcome Short: Continue to gain weight, but not too fast. Long: Enjoy his alcohol and sweets but in moderation.          Psychosocial: Target Goals: Acknowledge presence or absence of significant depression and/or stress, maximize coping skills, provide positive support system. Participant is able to verbalize types and ability to use techniques and skills needed for reducing stress and depression.  Initial Review & Psychosocial Screening:  Initial Psych Review & Screening - 01/15/24 1320       Initial Review   Current issues with Current Stress Concerns    Source of Stress Concerns Family    Comments [place], [place] lost a very important member of his family around a year ago, his significant other's child, so the family is still grieving over that loss.      Family Dynamics  Good Support System? Yes    Comments He has a good support system from his significant other Mona, and children/ grandchildren.      Barriers   Psychosocial barriers to participate in program There are no identifiable barriers or psychosocial needs.      Screening Interventions   Interventions Encouraged to exercise;To provide support and resources with identified psychosocial needs    Expected Outcomes Short Term goal:  Utilizing psychosocial counselor, staff and physician to assist with identification of specific Stressors or current issues interfering with healing process. Setting desired goal for each stressor or current issue identified.;Long Term Goal: Stressors or current issues are controlled or eliminated.;Short Term goal: Identification and review with participant of any Quality of Life or Depression concerns found by scoring the questionnaire.;Long Term goal: The participant improves quality of Life and PHQ9 Scores as seen by post scores and/or verbalization of changes          Quality of Life Scores:  Scores of 19 and below usually indicate a poorer quality of life in these areas.  A difference of  2-3 points is a clinically meaningful difference.  A difference of 2-3 points in the total score of the Quality of Life Index has been associated with significant improvement in overall quality of life, self-image, physical symptoms, and general health in studies assessing change in quality of life.  PHQ-9: Review Flowsheet       01/22/2024  Depression screen PHQ 2/9  Decreased Interest 3  Down, Depressed, Hopeless 1  PHQ - 2 Score 4  Altered sleeping 1  Tired, decreased energy 2  Change in appetite 2  Feeling bad or failure about yourself  2  Trouble concentrating 1  Moving slowly or fidgety/restless 1  Suicidal thoughts 0  PHQ-9 Score 13  Difficult doing work/chores Somewhat difficult   Interpretation of Total Score  Total Score Depression Severity:  1-4 = Minimal depression, 5-9 = Mild depression, 10-14 = Moderate depression, 15-19 = Moderately severe depression, 20-27 = Severe depression   Psychosocial Evaluation and Intervention:  Psychosocial Evaluation - 01/15/24 1322       Psychosocial Evaluation & Interventions   Interventions Stress management education;Relaxation education;Encouraged to exercise with the program and follow exercise prescription    Comments Elvyn who goes by  Ezzard is a pleasant 68 year old male who is coming into rehab for his COPD. He has an extensive medical history including cardiac and pulmonary issues. 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 He gets around ok, but states his knees and legs are a little weak. He goes to the local gym a few days a week and rides the stationary bike. He lives with his significant other Almetta, who is the retired [employer] of [employer], so she is great support for him. He is still a current smoker with 3-4 a day. This is a major cutback from 2 packs per day back in January. He does use O2 occassionally at night time when he feels SOB before bed. He is eager to start the program.    Expected Outcomes Short: Increase strength and stamina. Long: Improve shortness of breath.    Continue Psychosocial Services  Follow up required by staff          Psychosocial Re-Evaluation:  Psychosocial Re-Evaluation     Row Name 02/19/24 1100             Psychosocial Re-Evaluation   Current issues  with None Identified       Comments Daved is doing well in rehab. He identifies no stressors or sleep issues at the moment. He has quit smoking for 2 weeks now which all the staff congragulated him on!       Expected Outcomes Short: Continue to attend rehab. Long: Continue smoking cessation.       Interventions Encouraged to attend Pulmonary Rehabilitation for the exercise       Continue Psychosocial Services  Follow up required by staff          Psychosocial Discharge (Final Psychosocial Re-Evaluation):  Psychosocial Re-Evaluation - 02/19/24 1100       Psychosocial Re-Evaluation   Current issues with None Identified    Comments Vencil is doing well in rehab. He identifies no stressors or sleep issues at the moment. He has quit smoking for 2 weeks now which all the staff congragulated him on!    Expected Outcomes Short: Continue to attend rehab. Long: Continue smoking cessation.     Interventions Encouraged to attend Pulmonary Rehabilitation for the exercise    Continue Psychosocial Services  Follow up required by staff          Education: Education Goals: Education classes will be provided on a weekly basis, covering required topics. Participant will state understanding/return demonstration of topics presented.  Learning Barriers/Preferences:  Learning Barriers/Preferences - 01/15/24 1320       Learning Barriers/Preferences   Learning Barriers Sight   wears glasses.   Learning Preferences Audio;Skilled Demonstration;Verbal Instruction;Video;Group Instruction          Education Topics: Know Your Numbers Group instruction that is supported by a PowerPoint presentation. Instructor discusses importance of knowing and understanding resting, exercise, and post-exercise oxygen saturation, heart rate, and blood pressure. Oxygen saturation, heart rate, blood pressure, rating of perceived exertion, and dyspnea are reviewed along with a normal range for these values.    Exercise for the Pulmonary Patient Group instruction that is supported by a PowerPoint presentation. Instructor discusses benefits of exercise, core components of exercise, frequency, duration, and intensity of an exercise routine, importance of utilizing pulse oximetry during exercise, safety while exercising, and options of places to exercise outside of rehab.    MET Level  Group instruction provided by PowerPoint, verbal discussion, and written material to support subject matter. Instructor reviews what METs are and how to increase METs.    Pulmonary Medications Verbally interactive group education provided by instructor with focus on inhaled medications and proper administration.   Anatomy and Physiology of the Respiratory System Group instruction provided by PowerPoint, verbal discussion, and written material to support subject matter. Instructor reviews respiratory cycle and anatomical  components of the respiratory system and their functions. Instructor also reviews differences in obstructive and restrictive respiratory diseases with examples of each.    Oxygen Safety Group instruction provided by PowerPoint, verbal discussion, and written material to support subject matter. There is an overview of "What is Oxygen" and "Why do we need it".  Instructor also reviews how to create a safe environment for oxygen use, the importance of using oxygen as prescribed, and the risks of noncompliance. There is a brief discussion on traveling with oxygen and resources the patient may utilize.   Oxygen Use Group instruction provided by PowerPoint, verbal discussion, and written material to discuss how supplemental oxygen is prescribed and different types of oxygen supply systems. Resources for more information are provided.    Breathing Techniques Group instruction that is supported by demonstration  and informational handouts. Instructor discusses the benefits of pursed lip and diaphragmatic breathing and detailed demonstration on how to perform both.     Risk Factor Reduction Group instruction that is supported by a PowerPoint presentation. Instructor discusses the definition of a risk factor, different risk factors for pulmonary disease, and how the heart and lungs work together.   Pulmonary Diseases Group instruction provided by PowerPoint, verbal discussion, and written material to support subject matter. Instructor gives an overview of the different type of pulmonary diseases. There is also a discussion on risk factors and symptoms as well as ways to manage the diseases.   Stress and Energy Conservation Group instruction provided by PowerPoint, verbal discussion, and written material to support subject matter. Instructor gives an overview of stress and the impact it can have on the body. Instructor also reviews ways to reduce stress. There is also a discussion on energy conservation  and ways to conserve energy throughout the day.   Warning Signs and Symptoms Group instruction provided by PowerPoint, verbal discussion, and written material to support subject matter. Instructor reviews warning signs and symptoms of stroke, heart attack, cold and flu. Instructor also reviews ways to prevent the spread of infection.   Other Education Group or individual verbal, written, or video instructions that support the educational goals of the pulmonary rehab program.    Knowledge Questionnaire Score:  Knowledge Questionnaire Score - 01/22/24 1105       Knowledge Questionnaire Score   Pre Score 17/18          Core Components/Risk Factors/Patient Goals at Admission:  Personal Goals and Risk Factors at Admission - 01/15/24 1319       Core Components/Risk Factors/Patient Goals on Admission   Tobacco Cessation Yes    Number of packs per day 3-4 cigarettes per day. Cut down from 2 packs per day in January.    Intervention Assist the participant in steps to quit. Provide individualized education and counseling about committing to Tobacco Cessation, relapse prevention, and pharmacological support that can be provided by physician.;Education officer, environmental, assist with locating and accessing local/national Quit Smoking programs, and support quit date choice.    Expected Outcomes Short Term: Will demonstrate readiness to quit, by selecting a quit date.;Short Term: Will quit all tobacco product use, adhering to prevention of relapse plan.;Long Term: Complete abstinence from all tobacco products for at least 12 months from quit date.    Improve shortness of breath with ADL's Yes    Intervention Provide education, individualized exercise plan and daily activity instruction to help decrease symptoms of SOB with activities of daily living.    Expected Outcomes Short Term: Improve cardiorespiratory fitness to achieve a reduction of symptoms when performing ADLs;Long Term: Be able to  perform more ADLs without symptoms or delay the onset of symptoms    Hypertension Yes    Intervention Provide education on lifestyle modifcations including regular physical activity/exercise, weight management, moderate sodium restriction and increased consumption of fresh fruit, vegetables, and low fat dairy, alcohol moderation, and smoking cessation.;Monitor prescription use compliance.    Expected Outcomes Short Term: Continued assessment and intervention until BP is < 140/59mm HG in hypertensive participants. < 130/23mm HG in hypertensive participants with diabetes, heart failure or chronic kidney disease.;Long Term: Maintenance of blood pressure at goal levels.    Lipids Yes    Intervention Provide education and support for participant on nutrition & aerobic/resistive exercise along with prescribed medications to achieve LDL 70mg , HDL >40mg .  Expected Outcomes Short Term: Participant states understanding of desired cholesterol values and is compliant with medications prescribed. Participant is following exercise prescription and nutrition guidelines.;Long Term: Cholesterol controlled with medications as prescribed, with individualized exercise RX and with personalized nutrition plan. Value goals: LDL < 70mg , HDL > 40 mg.          Core Components/Risk Factors/Patient Goals Review:   Goals and Risk Factor Review     Row Name 02/19/24 1104             Core Components/Risk Factors/Patient Goals Review   Personal Goals Review Tobacco Cessation;Improve shortness of breath with ADL's;Heart Failure;Increase knowledge of respiratory medications and ability to use respiratory devices properly.;Develop more efficient breathing techniques such as purse lipped breathing and diaphragmatic breathing and practicing self-pacing with activity.       Review Lewis is doing well in rehab. He has quit smoking for 2 weeks now, and has started Chantix to help him with it. He also checks his BP and pulse  oximetry at home frequently.       Expected Outcomes Short: Continue to attend rehab. Long: Continue the tobacco cessation.          Core Components/Risk Factors/Patient Goals at Discharge (Final Review):   Goals and Risk Factor Review - 02/19/24 1104       Core Components/Risk Factors/Patient Goals Review   Personal Goals Review Tobacco Cessation;Improve shortness of breath with ADL's;Heart Failure;Increase knowledge of respiratory medications and ability to use respiratory devices properly.;Develop more efficient breathing techniques such as purse lipped breathing and diaphragmatic breathing and practicing self-pacing with activity.    Review Lewis is doing well in rehab. He has quit smoking for 2 weeks now, and has started Chantix to help him with it. He also checks his BP and pulse oximetry at home frequently.    Expected Outcomes Short: Continue to attend rehab. Long: Continue the tobacco cessation.          ITP Comments:  ITP Comments     Row Name 01/15/24 1316 01/22/24 1103 01/31/24 1034 01/31/24 1407 02/28/24 0822   ITP Comments Completed virtual orientation today.  EP evaluation is scheduled for 9/29 at 10:30 am .  Documentation for diagnosis can be found in [facility] encounter [facility]. Patient attend orientation today.  Patient is attending Pulmonary Rehabilitation Program.  Documentation for diagnosis can be found in [place] [facility].  Reviewed medical chart, RPE/RPD, gym safety, and program guidelines.  Patient was fitted to equipment they will be using during rehab.  Patient is scheduled to start exercise on 01/31/24.   Initial ITP created and sent for review and signature by Dr. Anton Kelp, Medical Director for Pulmonary Rehabilitation Program. First full day of exercise!  Patient was oriented to gym and equipment including functions, settings, policies, and procedures.  Patient's individual exercise prescription and treatment plan were reviewed.  All starting workloads were  established based on the results of the 6 minute walk test done at initial orientation visit.  The plan for exercise progression was also introduced and progression will be customized based on patient's performance and goals. 30 day review completed. ITP sent to Dr.Jehanzeb Memon, Medical Director of  Pulmonary Rehab. Continue with ITP unless changes are made by physician.    Started exercise sessions today. 30 day review completed. ITP sent to Dr.Jehanzeb Memon, Medical Director of  Pulmonary Rehab. Continue with ITP unless changes are made by physician.      Comments: 30 day review

## 2024-03-04 ENCOUNTER — Encounter (HOSPITAL_COMMUNITY): Payer: Self-pay

## 2024-03-04 ENCOUNTER — Encounter (HOSPITAL_COMMUNITY)

## 2024-03-04 DIAGNOSIS — J449 Chronic obstructive pulmonary disease, unspecified: Secondary | ICD-10-CM

## 2024-03-04 NOTE — Progress Notes (Addendum)
 Pulmonary Individual Treatment Plan  Patient Details  Name: Calvin Carroll. MRN: [id number] Date of Birth: Mar 20, 1956 Referring Provider:   Flowsheet Row PULMONARY REHAB COPD ORIENTATION from 01/22/2024 in [facility] CARDIAC REHABILITATION  Referring Provider Marcos Capes DO    Initial Encounter Date:  Flowsheet Row PULMONARY REHAB COPD ORIENTATION from 01/22/2024 in [facility] CARDIAC REHABILITATION  Date 01/22/24    Visit Diagnosis: Chronic obstructive pulmonary disease, unspecified COPD type (HCC)  Patient's Home Medications on Admission:  Current Outpatient Medications:    apixaban  (ELIQUIS ) 5 MG TABS tablet, Take 1 tablet (5 mg total) by mouth 2 (two) times daily., Disp: 60 tablet, Rfl: 2   azithromycin (ZITHROMAX) 250 MG tablet, Take 250 mg by mouth daily., Disp: , Rfl:    Cholecalciferol 50 MCG (2000 UT) TABS, Take 1 tablet by mouth daily., Disp: , Rfl:    doxycycline  (VIBRAMYCIN ) 100 MG capsule, Take 1 capsule (100 mg total) by mouth 2 (two) times daily., Disp: 20 capsule, Rfl: 0   fexofenadine (ALLEGRA) 180 MG tablet, Take 180 mg by mouth daily., Disp: , Rfl:    flunisolide (NASALIDE) 25 MCG/ACT (0.025%) SOLN, Place 2 sprays into the nose daily., Disp: , Rfl:    lisinopril  (ZESTRIL ) 10 MG tablet, Take 10 mg by mouth in the morning and at bedtime., Disp: , Rfl:    Mycophenolate  Sodium (MYCOPHENOLIC ACID ) 180 MG TBEC, Take 180 mg by mouth 2 (two) times daily., Disp: , Rfl:    nicotine  polacrilex (NICORETTE) 4 MG gum, Take 4 mg by mouth as needed for smoking cessation., Disp: , Rfl:    Omega-3 Fatty Acids (FISH OIL) 1000 MG CAPS, Take 1 capsule by mouth daily., Disp: , Rfl:    PRESCRIPTION MEDICATION, Take 1 tablet by mouth daily. Heartburn/acid reflux medication from [facility], Disp: , Rfl:    tacrolimus  (PROGRAF ) 1 MG capsule, Take 1 mg by mouth See admin instructions. Take 1 mg in the morning (take with 0.5 mg to equal 1.5 mg in the morning) and 1 mg in the evening,  Disp: , Rfl:    Tiotropium Bromide-Olodaterol (STIOLTO RESPIMAT) 2.5-2.5 MCG/ACT AERS, Inhale 2 puffs into the lungs daily., Disp: , Rfl:    torsemide  (DEMADEX ) 10 MG tablet, Take 1 tablet (10 mg total) by mouth daily., Disp: 30 tablet, Rfl: 1   varenicline (CHANTIX) 0.5 MG tablet, Take 0.5 mg by mouth 2 (two) times daily., Disp: , Rfl:   Past Medical History: Past Medical History:  Diagnosis Date   Acute respiratory failure with hypoxia (HCC) 08/12/2022   Atrial fibrillation (HCC)    COPD (chronic obstructive pulmonary disease) (HCC)    COPD exacerbation (HCC) 05/17/2021   Hypertension    MI (myocardial infarction) (HCC)    Renal disorder     Tobacco Use: Social History   Tobacco Use  Smoking Status Every Day   Current packs/day: 1.50   Types: Cigarettes   Passive exposure: Current  Smokeless Tobacco Never    Labs: Review Flowsheet       Latest Ref Rng & Units 08/12/2022 10/28/2022  Labs for ITP Cardiac and Pulmonary Rehab  Hemoglobin A1c 4.8 - 5.6 % 5.4  -  Bicarbonate 20.0 - 28.0 mmol/L - 24.6   O2 Saturation % - 82      Pulmonary Assessment Scores:  Pulmonary Assessment Scores     Row Name 01/22/24 1106         ADL UCSD   ADL Phase Entry  SOB Score total 88     Rest 2     Walk 3     Stairs 4     Bath 3     Dress 3     Shop 4       CAT Score   CAT Score 29        UCSD: Self-administered rating of dyspnea associated with activities of daily living (ADLs) 6-point scale (0 = not at all to 5 = maximal or unable to do because of breathlessness)  Scoring Scores range from 0 to 120.  Minimally important difference is 5 units  CAT: CAT can identify the health impairment of COPD patients and is better correlated with disease progression.  CAT has a scoring range of zero to 40. The CAT score is classified into four groups of low (less than 10), medium (10 - 20), high (21-30) and very high (31-40) based on the impact level of disease on health status. A  CAT score over 10 suggests significant symptoms.  A worsening CAT score could be explained by an exacerbation, poor medication adherence, poor inhaler technique, or progression of COPD or comorbid conditions.  CAT MCID is 2 points  mMRC: mMRC (Modified Medical Research Council) Dyspnea Scale is used to assess the degree of baseline functional disability in patients of respiratory disease due to dyspnea. No minimal important difference is established. A decrease in score of 1 point or greater is considered a positive change.   Pulmonary Function Assessment:   Exercise Target Goals: Exercise Program Goal: Individual exercise prescription set using results from initial 6 min walk test and THRR while considering  patient's activity barriers and safety.   Exercise Prescription Goal: Initial exercise prescription builds to 30-45 minutes a day of aerobic activity, 2-3 days per week.  Home exercise guidelines will be given to patient during program as part of exercise prescription that the participant will acknowledge.  Education: Aerobic Exercise: - Group verbal and visual presentation on the components of exercise prescription. Introduces F.I.T.T principle from ACSM for exercise prescriptions.  Reviews F.I.T.T. principles of aerobic exercise including progression. Written material provided at class time.   Education: Resistance Exercise: - Group verbal and visual presentation on the components of exercise prescription. Introduces F.I.T.T principle from ACSM for exercise prescriptions  Reviews F.I.T.T. principles of resistance exercise including progression. Written material provided at class time.    Education: Exercise & Equipment Safety: - Individual verbal instruction and demonstration of equipment use and safety with use of the equipment.   Education: Exercise Physiology & General Exercise Guidelines: - Group verbal and written instruction with models to review the exercise physiology of  the cardiovascular system and associated critical values. Provides general exercise guidelines with specific guidelines to those with heart or lung disease.    Education: Flexibility, Balance, Mind/Body Relaxation: - Group verbal and visual presentation with interactive activity on the components of exercise prescription. Introduces F.I.T.T principle from ACSM for exercise prescriptions. Reviews F.I.T.T. principles of flexibility and balance exercise training including progression. Also discusses the mind body connection.  Reviews various relaxation techniques to help reduce and manage stress (i.e. Deep breathing, progressive muscle relaxation, and visualization). Balance handout provided to take home. Written material provided at class time.   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 HR 86 bpm     Max Ex. BP 136/70     2 Minute Post BP 122/70       Interval HR   1 Minute HR 84     2 Minute HR 83     3 Minute HR 86     4 Minute HR 85     5 Minute HR 83     6 Minute HR 86     2 Minute Post HR 83     Interval Heart Rate? Yes       Interval Oxygen   Interval Oxygen? Yes     Baseline Oxygen Saturation % 97 %     1 Minute Oxygen Saturation % 96 %      1 Minute Liters of Oxygen 0 L     2 Minute Oxygen Saturation % 97 %     2 Minute Liters of Oxygen 0 L     3 Minute Oxygen Saturation % 96 %     3 Minute Liters of Oxygen 0 L     4 Minute Oxygen Saturation % 95 %     4 Minute Liters of Oxygen 0 L     5 Minute Oxygen Saturation % 92 %     5 Minute Liters of Oxygen 0 L     6 Minute Oxygen Saturation % 95 %     6 Minute Liters of Oxygen 0 L     2 Minute Post Oxygen Saturation % 95 %     2 Minute Post Liters of Oxygen 0 L       Oxygen Initial Assessment:  Oxygen Initial Assessment - 01/15/24 1318       Home Oxygen   Home Oxygen Device None    Sleep Oxygen Prescription Continuous    Liters per minute 2    Home Exercise Oxygen Prescription None    Home Resting Oxygen Prescription None    Compliance with Home Oxygen Use Yes      Intervention   Short Term Goals To learn and exhibit compliance with exercise, home and travel O2 prescription;To learn and understand importance of monitoring SPO2 with pulse oximeter and demonstrate accurate use of the pulse oximeter.;To learn and understand importance of maintaining oxygen saturations>88%;To learn and demonstrate proper pursed lip breathing techniques or other breathing techniques. ;To learn and demonstrate proper use of respiratory medications    Long  Term Goals Exhibits compliance with exercise, home  and travel O2 prescription;Verbalizes importance of monitoring SPO2 with pulse oximeter and return demonstration;Maintenance of O2 saturations>88%;Exhibits proper breathing techniques, such as pursed lip  breathing or other method taught during program session;Compliance with respiratory medication;Demonstrates proper use of MDI's          Oxygen Re-Evaluation:  Oxygen Re-Evaluation     Row Name 01/31/24 1035 02/19/24 1107           Program Oxygen Prescription   Program Oxygen Prescription -- None        Home Oxygen   Home Oxygen Device -- None      Sleep Oxygen Prescription --  Continuous      Liters per minute -- 2      Home Exercise Oxygen Prescription -- None      Home Resting Oxygen Prescription -- None      Compliance with Home Oxygen Use -- Yes        Goals/Expected Outcomes   Short Term Goals -- To learn and exhibit compliance with exercise, home and travel O2 prescription;To learn and understand importance of monitoring SPO2 with pulse oximeter and demonstrate accurate use of the pulse oximeter.;To learn and understand importance of maintaining oxygen saturations>88%;To learn and demonstrate proper pursed lip breathing techniques or other breathing techniques. ;To learn and demonstrate proper use of respiratory medications      Long  Term Goals -- Exhibits compliance with exercise, home  and travel O2 prescription;Verbalizes importance of monitoring SPO2 with pulse oximeter and return demonstration;Maintenance of O2 saturations>88%;Exhibits proper breathing techniques, such as pursed lip breathing or other method taught during program session;Compliance with respiratory medication;Demonstrates proper use of MDI's      Comments Reviewed PLB technique with pt.  Talked about how it works and it's importance in maintaining their exercise saturations. Calvin Carroll feels his breathing has gotten a bit better since starting the program. He wears 2L at night, but hasn't used it in 2 weeks and he feels his breathing is still good. He does use PLB when he feels SOB.      Goals/Expected Outcomes Short: Become more profiecient at using PLB.   Long: Become independent at using PLB. Short: Continue to attend rehab. Long: Continue to use PLB when feeling SOB.         Oxygen Discharge (Final Oxygen Re-Evaluation):  Oxygen Re-Evaluation - 02/19/24 1107       Program Oxygen Prescription   Program Oxygen Prescription None      Home Oxygen   Home Oxygen Device None    Sleep Oxygen Prescription Continuous    Liters per minute 2    Home Exercise Oxygen Prescription None    Home  Resting Oxygen Prescription None    Compliance with Home Oxygen Use Yes      Goals/Expected Outcomes   Short Term Goals To learn and exhibit compliance with exercise, home and travel O2 prescription;To learn and understand importance of monitoring SPO2 with pulse oximeter and demonstrate accurate use of the pulse oximeter.;To learn and understand importance of maintaining oxygen saturations>88%;To learn and demonstrate proper pursed lip breathing techniques or other breathing techniques. ;To learn and demonstrate proper use of respiratory medications    Long  Term Goals Exhibits compliance with exercise, home  and travel O2 prescription;Verbalizes importance of monitoring SPO2 with pulse oximeter and return demonstration;Maintenance of O2 saturations>88%;Exhibits proper breathing techniques, such as pursed lip breathing or other method taught during program session;Compliance with respiratory medication;Demonstrates proper use of MDI's    Comments Calvin Carroll feels his breathing has gotten a bit better since starting the program. He wears 2L at night, but hasn't used it in 2 weeks and  he feels his breathing is still good. He does use PLB when he feels SOB.    Goals/Expected Outcomes Short: Continue to attend rehab. Long: Continue to use PLB when feeling SOB.          Initial Exercise Prescription:  Initial Exercise Prescription - 01/22/24 1100       Date of Initial Exercise RX and Referring Provider   Date 01/22/24    Referring Provider Marcos, Chard DO      Bike   Level 4    Minutes 15    METs 1.8      NuStep   Level 1    SPM 50    Minutes 15    METs 1.8      Prescription Details   Frequency (times per week) 2    Duration Progress to 30 minutes of continuous aerobic without signs/symptoms of physical distress      Intensity   THRR 40-80% of Max Heartrate 110-139    Ratings of Perceived Exertion 11-13    Perceived Dyspnea 0-4      Resistance Training   Training Prescription Yes     Weight 3    Reps 10-15          Perform Capillary Blood Glucose checks as needed.  Exercise Prescription Changes:   Exercise Prescription Changes     Row Name 01/22/24 1100 01/31/24 1300 02/21/24 1200         Response to Exercise   Blood Pressure (Admit) 112/60 110/60 112/66     Blood Pressure (Exercise) 136/70 140/80 126/60     Blood Pressure (Exit) 122/70 128/68 102/60     Heart Rate (Admit) 81 bpm 69 bpm 61 bpm     Heart Rate (Exercise) 86 bpm 78 bpm 75 bpm     Heart Rate (Exit) 83 bpm 71 bpm 60 bpm     Oxygen Saturation (Admit) 97 % 97 % 95 %     Oxygen Saturation (Exercise) 92 % 98 % 98 %     Oxygen Saturation (Exit) 95 % 94 % 96 %     Rating of Perceived Exertion (Exercise) 12 15 13      Perceived Dyspnea (Exercise) 2 2 3      Duration -- Continue with 30 min of aerobic exercise without signs/symptoms of physical distress. Continue with 30 min of aerobic exercise without signs/symptoms of physical distress.     Intensity -- THRR unchanged THRR unchanged       Progression   Progression -- Continue to progress workloads to maintain intensity without signs/symptoms of physical distress. Continue to progress workloads to maintain intensity without signs/symptoms of physical distress.       Resistance Training   Training Prescription -- Yes Yes     Weight -- 3 3     Reps -- 10-15 10-15       Bike   Level -- 3 3     Minutes -- 15 15     METs -- 1.8 1.5       NuStep   Level -- 1 1     SPM -- 65 67     Minutes -- 15 15     METs -- 1.6 1.8        Exercise Comments:   Exercise Comments     Row Name 01/15/24 1318 01/22/24 1104 01/31/24 1034       Exercise Comments Calvin Carroll currently goes to the [employer] a couple times a week and gets on  the stationary bike. Patient attend orientation today.  Patient is attending Pulmonary Rehabilitation Program.  Documentation for diagnosis can be found in [facility].  Reviewed medical chart, RPE/RPD, gym safety, and program  guidelines.  Patient was fitted to equipment they will be using during rehab.  Patient is scheduled to start exercise on 01/31/24.   Initial ITP created and sent for review and signature by Dr. Anton Kelp, Medical Director for Pulmonary Rehabilitation Program. First full day of exercise!  Patient was oriented to gym and equipment including functions, settings, policies, and procedures.  Patient's individual exercise prescription and treatment plan were reviewed.  All starting workloads were established based on the results of the 6 minute walk test done at initial orientation visit.  The plan for exercise progression was also introduced and progression will be customized based on patient's performance and goals.        Exercise Goals and Review:   Exercise Goals     Row Name 01/15/24 1317             Exercise Goals   Increase Physical Activity Yes       Intervention Provide advice, education, support and counseling about physical activity/exercise needs.;Develop an individualized exercise prescription for aerobic and resistive training based on initial evaluation findings, risk stratification, comorbidities and participant's personal goals.       Expected Outcomes Short Term: Attend rehab on a regular basis to increase amount of physical activity.;Long Term: Exercising regularly at least 3-5 days a week.;Long Term: Add in home exercise to make exercise part of routine and to increase amount of physical activity.       Increase Strength and Stamina Yes       Intervention Provide advice, education, support and counseling about physical activity/exercise needs.;Develop an individualized exercise prescription for aerobic and resistive training based on initial evaluation findings, risk stratification, comorbidities and participant's personal goals.       Expected Outcomes Short Term: Increase workloads from initial exercise prescription for resistance, speed, and METs.;Short Term: Perform  resistance training exercises routinely during rehab and add in resistance training at home;Long Term: Improve cardiorespiratory fitness, muscular endurance and strength as measured by increased METs and functional capacity ( )       Able to understand and use rate of perceived exertion (RPE) scale Yes       Intervention Provide education and explanation on how to use RPE scale       Expected Outcomes Short Term: Able to use RPE daily in rehab to express subjective intensity level;Long Term:  Able to use RPE to guide intensity level when exercising independently       Able to understand and use Dyspnea scale Yes       Intervention Provide education and explanation on how to use Dyspnea scale       Expected Outcomes Short Term: Able to use Dyspnea scale daily in rehab to express subjective sense of shortness of breath during exertion;Long Term: Able to use Dyspnea scale to guide intensity level when exercising independently       Knowledge and understanding of Target Heart Rate Range (THRR) Yes       Intervention Provide education and explanation of THRR including how the numbers were predicted and where they are located for reference       Expected Outcomes Short Term: Able to state/look up THRR;Short Term: Able to use daily as guideline for intensity in rehab;Long Term: Able to use THRR to govern intensity when exercising  independently       Able to check pulse independently Yes       Intervention Provide education and demonstration on how to check pulse in carotid and radial arteries.;Review the importance of being able to check your own pulse for safety during independent exercise       Expected Outcomes Short Term: Able to explain why pulse checking is important during independent exercise;Long Term: Able to check pulse independently and accurately       Understanding of Exercise Prescription Yes       Intervention Provide education, explanation, and written materials on patient's individual  exercise prescription       Expected Outcomes Short Term: Able to explain program exercise prescription;Long Term: Able to explain home exercise prescription to exercise independently          Exercise Goals Re-Evaluation :  Exercise Goals Re-Evaluation     Row Name 01/31/24 1035 01/31/24 1337 02/19/24 1106         Exercise Goal Re-Evaluation   Exercise Goals Review Increase Physical Activity;Increase Strength and Stamina;Able to understand and use Dyspnea scale;Able to understand and use rate of perceived exertion (RPE) scale;Knowledge and understanding of Target Heart Rate Range (THRR);Able to check pulse independently;Understanding of Exercise Prescription Increase Physical Activity;Increase Strength and Stamina;Understanding of Exercise Prescription Increase Physical Activity;Increase Strength and Stamina;Able to understand and use Dyspnea scale;Able to understand and use rate of perceived exertion (RPE) scale;Knowledge and understanding of Target Heart Rate Range (THRR);Able to check pulse independently;Understanding of Exercise Prescription     Comments Reviewed RPE and dyspnea scale, THR and program prescription with pt today.  Pt voiced understanding and was given a copy of goals to take home. Calvin Carroll has completed his first session of PR. He is deconditioned and will need to take multiple breaks on the equipment. He did well today and pushed through. Will continue to monitor and progress as able. Calvin Carroll is doing well in rehab. He is increasing his levels on the bike and Nustep when he can. He states he walks some at home, and helps take care of his 30 year old grandaughter who lives with them which helps keep him active.     Expected Outcomes Short: Use RPE daily to regulate intensity.  Long: Follow program prescription in THR. Short: get use to equipment   long: continue to attend rehab Short: Continue to attend rehab. Long: Continue to exercise at home.        Discharge Exercise  Prescription (Final Exercise Prescription Changes):  Exercise Prescription Changes - 02/21/24 1200       Response to Exercise   Blood Pressure (Admit) 112/66    Blood Pressure (Exercise) 126/60    Blood Pressure (Exit) 102/60    Heart Rate (Admit) 61 bpm    Heart Rate (Exercise) 75 bpm    Heart Rate (Exit) 60 bpm    Oxygen Saturation (Admit) 95 %    Oxygen Saturation (Exercise) 98 %    Oxygen Saturation (Exit) 96 %    Rating of Perceived Exertion (Exercise) 13    Perceived Dyspnea (Exercise) 3    Duration Continue with 30 min of aerobic exercise without signs/symptoms of physical distress.    Intensity THRR unchanged      Progression   Progression Continue to progress workloads to maintain intensity without signs/symptoms of physical distress.      Resistance Training   Training Prescription Yes    Weight 3    Reps 10-15  Bike   Level 3    Minutes 15    METs 1.5      NuStep   Level 1    SPM 67    Minutes 15    METs 1.8          Nutrition:  Target Goals: Understanding of nutrition guidelines, daily intake of sodium 1500mg , cholesterol 200mg , calories 30% from fat and 7% or less from saturated fats, daily to have 5 or more servings of fruits and vegetables.  Education: Nutrition 1 -Group instruction provided by verbal, written material, interactive activities, discussions, models, and posters to present general guidelines for heart healthy nutrition including macronutrients, label reading, and promoting whole foods over processed counterparts. Education serves as pensions consultant of discussion of heart healthy eating for all. Written material provided at class time.     Education: Nutrition 2 -Group instruction provided by verbal, written material, interactive activities, discussions, models, and posters to present general guidelines for heart healthy nutrition including sodium, cholesterol, and saturated fat. Providing guidance of habit forming to improve blood  pressure, cholesterol, and body weight. Written material provided at class time.     Biometrics:  Pre Biometrics - 01/22/24 1134       Pre Biometrics   Height 5' 8 (1.727 m)    Weight 115 lb 4.8 oz (52.3 kg)    Waist Circumference 29.5 inches    Hip Circumference 33 inches    Waist to Hip Ratio 0.89 %    BMI (Calculated) 17.54    Grip Strength 23.3 kg           Nutrition Therapy Plan and Nutrition Goals:  Nutrition Therapy & Goals - 01/15/24 1320       Intervention Plan   Intervention Nutrition handout(s) given to patient.;Prescribe, educate and counsel regarding individualized specific dietary modifications aiming towards targeted core components such as weight, hypertension, lipid management, diabetes, heart failure and other comorbidities.    Expected Outcomes Long Term Goal: Adherence to prescribed nutrition plan.;Short Term Goal: Understand basic principles of dietary content, such as calories, fat, sodium, cholesterol and nutrients.;Short Term Goal: A plan has been developed with personal nutrition goals set during dietitian appointment.          Nutrition Assessments:  Nutrition Assessments - 01/22/24 1107       Rate Your Plate Scores   Pre Score 33         MEDIFICTS Score Key: >=70 Need to make dietary changes  40-70 Heart Healthy Diet <= 40 Therapeutic Level Cholesterol Diet  Flowsheet Row PULMONARY REHAB COPD ORIENTATION from 01/22/2024 in [facility] CARDIAC REHABILITATION  Picture Your Plate Total Score on Admission 33   Picture Your Plate Scores: <59 Unhealthy dietary pattern with much room for improvement. 41-50 Dietary pattern unlikely to meet recommendations for good health and room for improvement. 51-60 More healthful dietary pattern, with some room for improvement.  >60 Healthy dietary pattern, although there may be some specific behaviors that could be improved.   Nutrition Goals Re-Evaluation:  Nutrition Goals Re-Evaluation     Row  Name 02/19/24 1102             Goals   Current Weight 116 lb 8 oz (52.8 kg)       Nutrition Goal Healthy eating       Comment Calvin Carroll is doing ok with his diet. He is actually trying to gain weight, so he basically eats what he wants, yet follows a renal diet as well due  to having kidney issues. He has gained 2-3 lbs since starting the program. He loves to drink coffee and he does drink alcohol several times a week. Educated on moderation in that area.       Expected Outcome Short: Continue to gain weight, but not too fast. Long: Enjoy his alcohol and sweets but in moderation.          Nutrition Goals Discharge (Final Nutrition Goals Re-Evaluation):  Nutrition Goals Re-Evaluation - 02/19/24 1102       Goals   Current Weight 116 lb 8 oz (52.8 kg)    Nutrition Goal Healthy eating    Comment Calvin Carroll is doing ok with his diet. He is actually trying to gain weight, so he basically eats what he wants, yet follows a renal diet as well due to having kidney issues. He has gained 2-3 lbs since starting the program. He loves to drink coffee and he does drink alcohol several times a week. Educated on moderation in that area.    Expected Outcome Short: Continue to gain weight, but not too fast. Long: Enjoy his alcohol and sweets but in moderation.          Psychosocial: Target Goals: Acknowledge presence or absence of significant depression and/or stress, maximize coping skills, provide positive support system. Participant is able to verbalize types and ability to use techniques and skills needed for reducing stress and depression.   Education: Stress, Anxiety, and Depression - Group verbal and visual presentation to define topics covered.  Reviews how body is impacted by stress, anxiety, and depression.  Also discusses healthy ways to reduce stress and to treat/manage anxiety and depression.  Written material provided at class time.   Education: Sleep Hygiene -Provides group verbal and written  instruction about how sleep can affect your health.  Define sleep hygiene, discuss sleep cycles and impact of sleep habits. Review good sleep hygiene tips.    Initial Review & Psychosocial Screening:  Initial Psych Review & Screening - 01/15/24 1320       Initial Review   Current issues with Current Stress Concerns    Source of Stress Concerns Family    Comments Calvin Carroll, Calvin Carroll lost a very important member of his family around a year ago, his significant other's child, so the family is still grieving over that loss.      Family Dynamics   Good Support System? Yes    Comments He has a good support system from his significant other Calvin Carroll, and children/ grandchildren.      Barriers   Psychosocial barriers to participate in program There are no identifiable barriers or psychosocial needs.      Screening Interventions   Interventions Encouraged to exercise;To provide support and resources with identified psychosocial needs    Expected Outcomes Short Term goal: Utilizing psychosocial counselor, staff and physician to assist with identification of specific Stressors or current issues interfering with healing process. Setting desired goal for each stressor or current issue identified.;Long Term Goal: Stressors or current issues are controlled or eliminated.;Short Term goal: Identification and review with participant of any Quality of Life or Depression concerns found by scoring the questionnaire.;Long Term goal: The participant improves quality of Life and PHQ9 Scores as seen by post scores and/or verbalization of changes          Quality of Life Scores:  Scores of 19 and below usually indicate a poorer quality of life in these areas.  A difference of  2-3 points is a clinically  meaningful difference.  A difference of 2-3 points in the total score of the Quality of Life Index has been associated with significant improvement in overall quality of life, self-image, physical symptoms, and general  health in studies assessing change in quality of life.  PHQ-9: Review Flowsheet       01/22/2024  Depression screen PHQ 2/9  Decreased Interest 3  Down, Depressed, Hopeless 1  PHQ - 2 Score 4  Altered sleeping 1  Tired, decreased energy 2  Change in appetite 2  Feeling bad or failure about yourself  2  Trouble concentrating 1  Moving slowly or fidgety/restless 1  Suicidal thoughts 0  PHQ-9 Score 13   Difficult doing work/chores Somewhat difficult    Details       Data saved with a previous flowsheet row definition        Interpretation of Total Score  Total Score Depression Severity:  1-4 = Minimal depression, 5-9 = Mild depression, 10-14 = Moderate depression, 15-19 = Moderately severe depression, 20-27 = Severe depression   Psychosocial Evaluation and Intervention:  Psychosocial Evaluation - 01/15/24 1322       Psychosocial Evaluation & Interventions   Interventions Stress management education;Relaxation education;Encouraged to exercise with the program and follow exercise prescription    Comments Calvin Carroll who goes by Calvin Carroll is a pleasant 68 year old male who is coming into rehab for his COPD. He has an extensive medical history including cardiac and pulmonary issues. 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 He gets around ok, but states his knees and legs are a little weak. He goes to the local gym a few days a week and rides the stationary bike. He lives with his significant other Calvin Carroll, who is the retired [employer] of [employer], so she is great support for him. He is still a current smoker with 3-4 a day. This is a major cutback from 2 packs per day back in January. He does use O2 occassionally at night time when he feels SOB before bed. He is eager to start the program.    Expected Outcomes Short: Increase strength and stamina. Long: Improve shortness of breath.    Continue Psychosocial Services  Follow up required by staff           Psychosocial Re-Evaluation:  Psychosocial Re-Evaluation     Row Name 02/19/24 1100             Psychosocial Re-Evaluation   Current issues with None Identified       Comments Calvin Carroll is doing well in rehab. He identifies no stressors or sleep issues at the moment. He has quit smoking for 2 weeks now which all the staff congragulated him on!       Expected Outcomes Short: Continue to attend rehab. Long: Continue smoking cessation.       Interventions Encouraged to attend Pulmonary Rehabilitation for the exercise       Continue Psychosocial Services  Follow up required by staff          Psychosocial Discharge (Final Psychosocial Re-Evaluation):  Psychosocial Re-Evaluation - 02/19/24 1100       Psychosocial Re-Evaluation   Current issues with None Identified    Comments Calvin Carroll is doing well in rehab. He identifies no stressors or sleep issues at the moment. He has quit smoking for 2 weeks now which all the staff congragulated him on!    Expected Outcomes Short: Continue to attend rehab.  Long: Continue smoking cessation.    Interventions Encouraged to attend Pulmonary Rehabilitation for the exercise    Continue Psychosocial Services  Follow up required by staff          Education: Education Goals: Education classes will be provided on a weekly basis, covering required topics. Participant will state understanding/return demonstration of topics presented.  Learning Barriers/Preferences:  Learning Barriers/Preferences - 01/15/24 1320       Learning Barriers/Preferences   Learning Barriers Sight   wears glasses.   Learning Preferences Audio;Skilled Demonstration;Verbal Instruction;Video;Group Instruction          General Pulmonary Education Topics:  Infection Prevention: - Provides verbal and written material to individual with discussion of infection control including proper hand washing and proper equipment cleaning during exercise session.   Falls Prevention: -  Provides verbal and written material to individual with discussion of falls prevention and safety.   Chronic Lung Disease Review: - Group verbal instruction with posters, models, PowerPoint presentations and videos,  to review new updates, new respiratory medications, new advancements in procedures and treatments. Providing information on websites and 800 numbers for continued self-education. Includes information about supplement oxygen, available portable oxygen systems, continuous and intermittent flow rates, oxygen safety, concentrators, and Medicare reimbursement for oxygen. Explanation of Pulmonary Drugs, including class, frequency, complications, importance of spacers, rinsing mouth after steroid MDI's, and proper cleaning methods for nebulizers. Review of basic lung anatomy and physiology related to function, structure, and complications of lung disease. Review of risk factors. Discussion about methods for diagnosing sleep apnea and types of masks and machines for OSA. Includes a review of the use of types of environmental controls: home humidity, furnaces, filters, dust mite/pet prevention, HEPA vacuums. Discussion about weather changes, air quality and the benefits of nasal washing. Instruction on Warning signs, infection symptoms, calling MD promptly, preventive modes, and value of vaccinations. Review of effective airway clearance, coughing and/or vibration techniques. Emphasizing that all should Create an Action Plan. Written material provided at class time. Flowsheet Row PULMONARY REHAB OTHER RESPIRATORY from 02/21/2024 in [facility]  Date 01/31/24  Educator [facility]  Instruction Review Code 1- Verbalizes Understanding    AED/CPR: - Group verbal and written instruction with the use of models to demonstrate the basic use of the AED with the basic ABC's of resuscitation.    Tests and Procedures:  - Group verbal and visual presentation and models provide information  about basic cardiac anatomy and function. Reviews the testing methods done to diagnose heart disease and the outcomes of the test results. Describes the treatment choices: Medical Management, Angioplasty, or Coronary Bypass Surgery for treating various heart conditions including Myocardial Infarction, Angina, Valve Disease, and Cardiac Arrhythmias.  Written material provided at class time.   Medication Safety: - Group verbal and visual instruction to review commonly prescribed medications for heart and lung disease. Reviews the medication, class of the drug, and side effects. Includes the steps to properly store meds and maintain the prescription regimen.  Written material given at graduation.   Other: -Provides group and verbal instruction on various topics (see comments)   Knowledge Questionnaire Score:  Knowledge Questionnaire Score - 01/22/24 1105       Knowledge Questionnaire Score   Pre Score 17/18           Core Components/Risk Factors/Patient Goals at Admission:  Personal Goals and Risk Factors at Admission - 01/15/24 1319       Core Components/Risk Factors/Patient Goals on Admission   Tobacco  Cessation Yes    Number of packs per day 3-4 cigarettes per day. Cut down from 2 packs per day in January.    Intervention Assist the participant in steps to quit. Provide individualized education and counseling about committing to Tobacco Cessation, relapse prevention, and pharmacological support that can be provided by physician.;Education officer, environmental, assist with locating and accessing local/national Quit Smoking programs, and support quit date choice.    Expected Outcomes Short Term: Will demonstrate readiness to quit, by selecting a quit date.;Short Term: Will quit all tobacco product use, adhering to prevention of relapse plan.;Long Term: Complete abstinence from all tobacco products for at least 12 months from quit date.    Improve shortness of breath with ADL's Yes     Intervention Provide education, individualized exercise plan and daily activity instruction to help decrease symptoms of SOB with activities of daily living.    Expected Outcomes Short Term: Improve cardiorespiratory fitness to achieve a reduction of symptoms when performing ADLs;Long Term: Be able to perform more ADLs without symptoms or delay the onset of symptoms    Hypertension Yes    Intervention Provide education on lifestyle modifcations including regular physical activity/exercise, weight management, moderate sodium restriction and increased consumption of fresh fruit, vegetables, and low fat dairy, alcohol moderation, and smoking cessation.;Monitor prescription use compliance.    Expected Outcomes Short Term: Continued assessment and intervention until BP is < 140/75mm HG in hypertensive participants. < 130/65mm HG in hypertensive participants with diabetes, heart failure or chronic kidney disease.;Long Term: Maintenance of blood pressure at goal levels.    Lipids Yes    Intervention Provide education and support for participant on nutrition & aerobic/resistive exercise along with prescribed medications to achieve LDL 70mg , HDL >40mg .    Expected Outcomes Short Term: Participant states understanding of desired cholesterol values and is compliant with medications prescribed. Participant is following exercise prescription and nutrition guidelines.;Long Term: Cholesterol controlled with medications as prescribed, with individualized exercise RX and with personalized nutrition plan. Value goals: LDL < 70mg , HDL > 40 mg.          Education:Diabetes - Individual verbal and written instruction to review signs/symptoms of diabetes, desired ranges of glucose level fasting, after meals and with exercise. Acknowledge that pre and post exercise glucose checks will be done for 3 sessions at entry of program.   Know Your Numbers and Heart Failure: - Group verbal and visual instruction to discuss  disease risk factors for cardiac and pulmonary disease and treatment options.  Reviews associated critical values for Overweight/Obesity, Hypertension, Cholesterol, and Diabetes.  Discusses basics of heart failure: signs/symptoms and treatments.  Introduces Heart Failure Zone chart for action plan for heart failure. Written material provided at class time.   Core Components/Risk Factors/Patient Goals Review:   Goals and Risk Factor Review     Row Name 02/19/24 1104             Core Components/Risk Factors/Patient Goals Review   Personal Goals Review Tobacco Cessation;Improve shortness of breath with ADL's;Heart Failure;Increase knowledge of respiratory medications and ability to use respiratory devices properly.;Develop more efficient breathing techniques such as purse lipped breathing and diaphragmatic breathing and practicing self-pacing with activity.       Review Calvin Carroll is doing well in rehab. He has quit smoking for 2 weeks now, and has started Chantix to help him with it. He also checks his BP and pulse oximetry at home frequently.       Expected Outcomes Short: Continue to  attend rehab. Long: Continue the tobacco cessation.          Core Components/Risk Factors/Patient Goals at Discharge (Final Review):   Goals and Risk Factor Review - 02/19/24 1104       Core Components/Risk Factors/Patient Goals Review   Personal Goals Review Tobacco Cessation;Improve shortness of breath with ADL's;Heart Failure;Increase knowledge of respiratory medications and ability to use respiratory devices properly.;Develop more efficient breathing techniques such as purse lipped breathing and diaphragmatic breathing and practicing self-pacing with activity.    Review Calvin Carroll is doing well in rehab. He has quit smoking for 2 weeks now, and has started Chantix to help him with it. He also checks his BP and pulse oximetry at home frequently.    Expected Outcomes Short: Continue to attend rehab. Long: Continue the  tobacco cessation.          ITP Comments:  ITP Comments     Row Name 01/15/24 1316 01/22/24 1103 01/31/24 1034 01/31/24 1407 02/28/24 0822   ITP Comments Completed virtual orientation today.  EP evaluation is scheduled for 9/29 at 10:30 am .  Documentation for diagnosis can be found in [facility] encounter [facility]. Patient attend orientation today.  Patient is attending Pulmonary Rehabilitation Program.  Documentation for diagnosis can be found in [place] [facility].  Reviewed medical chart, RPE/RPD, gym safety, and program guidelines.  Patient was fitted to equipment they will be using during rehab.  Patient is scheduled to start exercise on 01/31/24.   Initial ITP created and sent for review and signature by Dr. Anton Kelp, Medical Director for Pulmonary Rehabilitation Program. First full day of exercise!  Patient was oriented to gym and equipment including functions, settings, policies, and procedures.  Patient's individual exercise prescription and treatment plan were reviewed.  All starting workloads were established based on the results of the 6 minute walk test done at initial orientation visit.  The plan for exercise progression was also introduced and progression will be customized based on patient's performance and goals. 30 day review completed. ITP sent to Dr.Jehanzeb Memon, Medical Director of  Pulmonary Rehab. Continue with ITP unless changes are made by physician.    Started exercise sessions today. 30 day review completed. ITP sent to Dr.Jehanzeb Memon, Medical Director of  Pulmonary Rehab. Continue with ITP unless changes are made by physician.      Comments: Discharge ITP

## 2024-03-04 NOTE — Progress Notes (Signed)
 Discharge Progress Report  Patient Details  Name: Calvin Carroll. MRN: [id number] Date of Birth: [birth year] Referring Provider:   Flowsheet Row PULMONARY REHAB COPD ORIENTATION from 67/29/2025 in [facility] CARDIAC REHABILITATION  Referring Provider Marcos Capes DO     Number of Visits: 6  Reason for Discharge:  Early Exit:  Personal and going to use home gym  Smoking History:  Social History   Tobacco Use  Smoking Status Every Day   Current packs/day: 1.50   Types: Cigarettes   Passive exposure: Current  Smokeless Tobacco Never    Diagnosis:  Chronic obstructive pulmonary disease, unspecified COPD type (HCC)  ADL UCSD:  Pulmonary Assessment Scores     Row Name 01/22/24 1106         ADL UCSD   ADL Phase Entry     SOB Score total 88     Rest 2     Walk 3     Stairs 4     Bath 3     Dress 3     Shop 4       CAT Score   CAT Score 29        Initial Exercise Prescription:  Initial Exercise Prescription - 01/22/24 1100       Date of Initial Exercise RX and Referring Provider   Date 01/22/24    Referring Provider Marcos, Chard DO      Bike   Level 4    Minutes 15    METs 1.8      NuStep   Level 1    SPM 50    Minutes 15    METs 1.8      Prescription Details   Frequency (times per week) 2    Duration Progress to 30 minutes of continuous aerobic without signs/symptoms of physical distress      Intensity   THRR 40-80% of Max Heartrate 110-139    Ratings of Perceived Exertion 11-13    Perceived Dyspnea 0-4      Resistance Training   Training Prescription Yes    Weight 3    Reps 10-15          Discharge Exercise Prescription (Final Exercise Prescription Changes):  Exercise Prescription Changes - 02/21/24 1200       Response to Exercise   Blood Pressure (Admit) 112/66    Blood Pressure (Exercise) 126/60    Blood Pressure (Exit) 102/60    Heart Rate (Admit) 61 bpm    Heart Rate (Exercise) 75 bpm    Heart Rate (Exit) 60 bpm     Oxygen Saturation (Admit) 95 %    Oxygen Saturation (Exercise) 98 %    Oxygen Saturation (Exit) 96 %    Rating of Perceived Exertion (Exercise) 13    Perceived Dyspnea (Exercise) 3    Duration Continue with 30 min of aerobic exercise without signs/symptoms of physical distress.    Intensity THRR unchanged      Progression   Progression Continue to progress workloads to maintain intensity without signs/symptoms of physical distress.      Resistance Training   Training Prescription Yes    Weight 3    Reps 10-15      Bike   Level 3    Minutes 15    METs 1.5      NuStep   Level 1    SPM 67    Minutes 15    METs 1.8  Functional Capacity:  6 Minute Walk     Row Name 01/22/24 1128         6 Minute Walk   Phase Initial     Distance 1000 feet     Walk Time 6 minutes     # of Rest Breaks 1     MPH 1.89     METS 3.1     RPE 12     Perceived Dyspnea  2     VO2 Peak 10.86     Symptoms Yes (comment)     Comments one sitting break for 20 secs due to SOB     Resting HR 81 bpm     Resting BP 112/60     Resting Oxygen Saturation  97 %     Exercise Oxygen Saturation  during 6 min walk 92 %     Max Ex. HR 86 bpm     Max Ex. BP 136/70     2 Minute Post BP 122/70       Interval HR   1 Minute HR 84     2 Minute HR 83     3 Minute HR 86     4 Minute HR 85     5 Minute HR 83     6 Minute HR 86     2 Minute Post HR 83     Interval Heart Rate? Yes       Interval Oxygen   Interval Oxygen? Yes     Baseline Oxygen Saturation % 97 %     1 Minute Oxygen Saturation % 96 %     1 Minute Liters of Oxygen 0 L     2 Minute Oxygen Saturation % 97 %     2 Minute Liters of Oxygen 0 L     3 Minute Oxygen Saturation % 96 %     3 Minute Liters of Oxygen 0 L     4 Minute Oxygen Saturation % 95 %     4 Minute Liters of Oxygen 0 L     5 Minute Oxygen Saturation % 92 %     5 Minute Liters of Oxygen 0 L     6 Minute Oxygen Saturation % 95 %     6 Minute Liters of Oxygen 0 L      2 Minute Post Oxygen Saturation % 95 %     2 Minute Post Liters of Oxygen 0 L        Psychological, QOL, Others - Outcomes: PHQ 2/9:    01/22/2024   10:57 AM  Depression screen PHQ 2/9  Decreased Interest 3  Down, Depressed, Hopeless 1  PHQ - 2 Score 4  Altered sleeping 1  Tired, decreased energy 2  Change in appetite 2  Feeling bad or failure about yourself  2  Trouble concentrating 1  Moving slowly or fidgety/restless 1  Suicidal thoughts 0  PHQ-9 Score 13   Difficult doing work/chores Somewhat difficult     Data saved with a previous flowsheet row definition    Quality of Life:   Personal Goals: Goals established at orientation with interventions provided to work toward goal.  Personal Goals and Risk Factors at Admission - 01/15/24 1319       Core Components/Risk Factors/Patient Goals on Admission   Tobacco Cessation Yes    Number of packs per day 3-4 cigarettes per day. Cut down from 2 packs per day in January.    Intervention Assist  the participant in steps to quit. Provide individualized education and counseling about committing to Tobacco Cessation, relapse prevention, and pharmacological support that can be provided by physician.;Education officer, environmental, assist with locating and accessing local/national Quit Smoking programs, and support quit date choice.    Expected Outcomes Short Term: Will demonstrate readiness to quit, by selecting a quit date.;Short Term: Will quit all tobacco product use, adhering to prevention of relapse plan.;Long Term: Complete abstinence from all tobacco products for at least 12 months from quit date.    Improve shortness of breath with ADL's Yes    Intervention Provide education, individualized exercise plan and daily activity instruction to help decrease symptoms of SOB with activities of daily living.    Expected Outcomes Short Term: Improve cardiorespiratory fitness to achieve a reduction of symptoms when performing ADLs;Long  Term: Be able to perform more ADLs without symptoms or delay the onset of symptoms    Hypertension Yes    Intervention Provide education on lifestyle modifcations including regular physical activity/exercise, weight management, moderate sodium restriction and increased consumption of fresh fruit, vegetables, and low fat dairy, alcohol moderation, and smoking cessation.;Monitor prescription use compliance.    Expected Outcomes Short Term: Continued assessment and intervention until BP is < 140/87mm HG in hypertensive participants. < 130/49mm HG in hypertensive participants with diabetes, heart failure or chronic kidney disease.;Long Term: Maintenance of blood pressure at goal levels.    Lipids Yes    Intervention Provide education and support for participant on nutrition & aerobic/resistive exercise along with prescribed medications to achieve LDL 70mg , HDL >40mg .    Expected Outcomes Short Term: Participant states understanding of desired cholesterol values and is compliant with medications prescribed. Participant is following exercise prescription and nutrition guidelines.;Long Term: Cholesterol controlled with medications as prescribed, with individualized exercise RX and with personalized nutrition plan. Value goals: LDL < 70mg , HDL > 40 mg.           Personal Goals Discharge:  Goals and Risk Factor Review     Row Name 02/19/24 1104             Core Components/Risk Factors/Patient Goals Review   Personal Goals Review Tobacco Cessation;Improve shortness of breath with ADL's;Heart Failure;Increase knowledge of respiratory medications and ability to use respiratory devices properly.;Develop more efficient breathing techniques such as purse lipped breathing and diaphragmatic breathing and practicing self-pacing with activity.       Review Lewis is doing well in rehab. He has quit smoking for 2 weeks now, and has started Chantix to help him with it. He also checks his BP and pulse oximetry at  home frequently.       Expected Outcomes Short: Continue to attend rehab. Long: Continue the tobacco cessation.          Exercise Goals and Review:  Exercise Goals     Row Name 01/15/24 1317             Exercise Goals   Increase Physical Activity Yes       Intervention Provide advice, education, support and counseling about physical activity/exercise needs.;Develop an individualized exercise prescription for aerobic and resistive training based on initial evaluation findings, risk stratification, comorbidities and participant's personal goals.       Expected Outcomes Short Term: Attend rehab on a regular basis to increase amount of physical activity.;Long Term: Exercising regularly at least 3-5 days a week.;Long Term: Add in home exercise to make exercise part of routine and to increase amount of physical activity.  Increase Strength and Stamina Yes       Intervention Provide advice, education, support and counseling about physical activity/exercise needs.;Develop an individualized exercise prescription for aerobic and resistive training based on initial evaluation findings, risk stratification, comorbidities and participant's personal goals.       Expected Outcomes Short Term: Increase workloads from initial exercise prescription for resistance, speed, and METs.;Short Term: Perform resistance training exercises routinely during rehab and add in resistance training at home;Long Term: Improve cardiorespiratory fitness, muscular endurance and strength as measured by increased METs and functional capacity ( )       Able to understand and use rate of perceived exertion (RPE) scale Yes       Intervention Provide education and explanation on how to use RPE scale       Expected Outcomes Short Term: Able to use RPE daily in rehab to express subjective intensity level;Long Term:  Able to use RPE to guide intensity level when exercising independently       Able to understand and use Dyspnea  scale Yes       Intervention Provide education and explanation on how to use Dyspnea scale       Expected Outcomes Short Term: Able to use Dyspnea scale daily in rehab to express subjective sense of shortness of breath during exertion;Long Term: Able to use Dyspnea scale to guide intensity level when exercising independently       Knowledge and understanding of Target Heart Rate Range (THRR) Yes       Intervention Provide education and explanation of THRR including how the numbers were predicted and where they are located for reference       Expected Outcomes Short Term: Able to state/look up THRR;Short Term: Able to use daily as guideline for intensity in rehab;Long Term: Able to use THRR to govern intensity when exercising independently       Able to check pulse independently Yes       Intervention Provide education and demonstration on how to check pulse in carotid and radial arteries.;Review the importance of being able to check your own pulse for safety during independent exercise       Expected Outcomes Short Term: Able to explain why pulse checking is important during independent exercise;Long Term: Able to check pulse independently and accurately       Understanding of Exercise Prescription Yes       Intervention Provide education, explanation, and written materials on patient's individual exercise prescription       Expected Outcomes Short Term: Able to explain program exercise prescription;Long Term: Able to explain home exercise prescription to exercise independently          Exercise Goals Re-Evaluation:  Exercise Goals Re-Evaluation     Row Name 01/31/24 1035 01/31/24 1337 02/19/24 1106         Exercise Goal Re-Evaluation   Exercise Goals Review Increase Physical Activity;Increase Strength and Stamina;Able to understand and use Dyspnea scale;Able to understand and use rate of perceived exertion (RPE) scale;Knowledge and understanding of Target Heart Rate Range (THRR);Able to check  pulse independently;Understanding of Exercise Prescription Increase Physical Activity;Increase Strength and Stamina;Understanding of Exercise Prescription Increase Physical Activity;Increase Strength and Stamina;Able to understand and use Dyspnea scale;Able to understand and use rate of perceived exertion (RPE) scale;Knowledge and understanding of Target Heart Rate Range (THRR);Able to check pulse independently;Understanding of Exercise Prescription     Comments Reviewed RPE and dyspnea scale, THR and program prescription with pt today.  Pt voiced understanding and  was given a copy of goals to take home. Lewis has completed his first session of PR. He is deconditioned and will need to take multiple breaks on the equipment. He did well today and pushed through. Will continue to monitor and progress as able. Lewis is doing well in rehab. He is increasing his levels on the bike and Nustep when he can. He states he walks some at home, and helps take care of his 71 year old grandaughter who lives with them which helps keep him active.     Expected Outcomes Short: Use RPE daily to regulate intensity.  Long: Follow program prescription in THR. Short: get use to equipment   long: continue to attend rehab Short: Continue to attend rehab. Long: Continue to exercise at home.        Nutrition & Weight - Outcomes:  Pre Biometrics - 01/22/24 1134       Pre Biometrics   Height 5' 8 (1.727 m)    Weight 115 lb 4.8 oz (52.3 kg)    Waist Circumference 29.5 inches    Hip Circumference 33 inches    Waist to Hip Ratio 0.89 %    BMI (Calculated) 17.54    Grip Strength 23.3 kg           Nutrition:  Nutrition Therapy & Goals - 01/15/24 1320       Intervention Plan   Intervention Nutrition handout(s) given to patient.;Prescribe, educate and counsel regarding individualized specific dietary modifications aiming towards targeted core components such as weight, hypertension, lipid management, diabetes, heart failure  and other comorbidities.    Expected Outcomes Long Term Goal: Adherence to prescribed nutrition plan.;Short Term Goal: Understand basic principles of dietary content, such as calories, fat, sodium, cholesterol and nutrients.;Short Term Goal: A plan has been developed with personal nutrition goals set during dietitian appointment.          Nutrition Discharge:  Nutrition Assessments - 01/22/24 1107       Rate Your Plate Scores   Pre Score 33          Education Questionnaire Score:  Knowledge Questionnaire Score - 01/22/24 1105       Knowledge Questionnaire Score   Pre Score 17/18          Goals reviewed with patient; copy given to patient.

## 2024-03-06 ENCOUNTER — Encounter (HOSPITAL_COMMUNITY)

## 2024-03-11 ENCOUNTER — Encounter (HOSPITAL_COMMUNITY)

## 2024-03-13 ENCOUNTER — Encounter (HOSPITAL_COMMUNITY)

## 2024-03-18 ENCOUNTER — Encounter (HOSPITAL_COMMUNITY)

## 2024-03-20 ENCOUNTER — Encounter (HOSPITAL_COMMUNITY)

## 2024-03-25 ENCOUNTER — Encounter (HOSPITAL_COMMUNITY)

## 2024-03-27 ENCOUNTER — Encounter (HOSPITAL_COMMUNITY)

## 2024-04-01 ENCOUNTER — Encounter (HOSPITAL_COMMUNITY)

## 2024-04-03 ENCOUNTER — Encounter (HOSPITAL_COMMUNITY)

## 2024-04-08 ENCOUNTER — Encounter (HOSPITAL_COMMUNITY)

## 2024-04-10 ENCOUNTER — Encounter (HOSPITAL_COMMUNITY)

## 2024-04-15 ENCOUNTER — Encounter (HOSPITAL_COMMUNITY)

## 2024-04-17 ENCOUNTER — Encounter (HOSPITAL_COMMUNITY)

## 2024-04-22 ENCOUNTER — Encounter (HOSPITAL_COMMUNITY)

## 2024-04-24 ENCOUNTER — Encounter (HOSPITAL_COMMUNITY)

## 2024-04-29 ENCOUNTER — Encounter (HOSPITAL_COMMUNITY)

## 2024-05-01 ENCOUNTER — Encounter (HOSPITAL_COMMUNITY)

## 2024-05-06 ENCOUNTER — Encounter (HOSPITAL_COMMUNITY)

## 2024-05-08 ENCOUNTER — Encounter (HOSPITAL_COMMUNITY)

## 2024-05-13 ENCOUNTER — Encounter (HOSPITAL_COMMUNITY)

## 2024-05-15 ENCOUNTER — Encounter (HOSPITAL_COMMUNITY)

## 2024-05-20 ENCOUNTER — Encounter (HOSPITAL_COMMUNITY)

## 2024-05-22 ENCOUNTER — Encounter (HOSPITAL_COMMUNITY)

## 2024-05-27 ENCOUNTER — Encounter (HOSPITAL_COMMUNITY)

## 2024-05-29 ENCOUNTER — Encounter (HOSPITAL_COMMUNITY)

## 2024-06-03 ENCOUNTER — Encounter (HOSPITAL_COMMUNITY)
